# Patient Record
Sex: Female | Born: 1935 | Race: White | Hispanic: No | Marital: Married | Smoking: Never smoker
Health system: Southern US, Community
[De-identification: ages and names within clinical notes are randomized; demographics above are authoritative.]

## PROBLEM LIST (undated history)

## (undated) DIAGNOSIS — Z789 Other specified health status: Secondary | ICD-10-CM

## (undated) DIAGNOSIS — L299 Pruritus, unspecified: Secondary | ICD-10-CM

## (undated) DIAGNOSIS — M1990 Unspecified osteoarthritis, unspecified site: Secondary | ICD-10-CM

## (undated) DIAGNOSIS — E039 Hypothyroidism, unspecified: Secondary | ICD-10-CM

## (undated) DIAGNOSIS — F419 Anxiety disorder, unspecified: Secondary | ICD-10-CM

## (undated) DIAGNOSIS — F32A Depression, unspecified: Secondary | ICD-10-CM

## (undated) DIAGNOSIS — N189 Chronic kidney disease, unspecified: Secondary | ICD-10-CM

## (undated) DIAGNOSIS — F329 Major depressive disorder, single episode, unspecified: Secondary | ICD-10-CM

## (undated) DIAGNOSIS — K219 Gastro-esophageal reflux disease without esophagitis: Secondary | ICD-10-CM

## (undated) DIAGNOSIS — E079 Disorder of thyroid, unspecified: Secondary | ICD-10-CM

## (undated) DIAGNOSIS — R140 Abdominal distension (gaseous): Secondary | ICD-10-CM

## (undated) HISTORY — PX: ABDOMINAL HYSTERECTOMY: SHX81

## (undated) HISTORY — DX: Abdominal distension (gaseous): R14.0

## (undated) HISTORY — DX: Disorder of thyroid, unspecified: E07.9

## (undated) HISTORY — PX: NOSE SURGERY: SHX723

## (undated) HISTORY — PX: OTHER SURGICAL HISTORY: SHX169

---
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
## 1998-10-31 ENCOUNTER — Other Ambulatory Visit: Admission: RE | Admit: 1998-10-31 | Discharge: 1998-10-31 | Payer: Self-pay | Admitting: Obstetrics & Gynecology

## 1999-11-17 ENCOUNTER — Other Ambulatory Visit: Admission: RE | Admit: 1999-11-17 | Discharge: 1999-11-17 | Payer: Self-pay | Admitting: Obstetrics & Gynecology

## 2001-09-20 ENCOUNTER — Other Ambulatory Visit: Admission: RE | Admit: 2001-09-20 | Discharge: 2001-09-20 | Payer: Self-pay | Admitting: Obstetrics & Gynecology

## 2002-10-31 ENCOUNTER — Other Ambulatory Visit: Admission: RE | Admit: 2002-10-31 | Discharge: 2002-10-31 | Payer: Self-pay | Admitting: Obstetrics & Gynecology

## 2004-07-07 ENCOUNTER — Other Ambulatory Visit: Admission: RE | Admit: 2004-07-07 | Discharge: 2004-07-07 | Payer: Self-pay | Admitting: Obstetrics & Gynecology

## 2010-07-01 ENCOUNTER — Encounter: Admission: RE | Admit: 2010-07-01 | Discharge: 2010-07-01 | Payer: Self-pay | Admitting: Internal Medicine

## 2011-09-18 ENCOUNTER — Ambulatory Visit (INDEPENDENT_AMBULATORY_CARE_PROVIDER_SITE_OTHER): Payer: Medicare Other | Admitting: General Surgery

## 2011-09-18 ENCOUNTER — Encounter (INDEPENDENT_AMBULATORY_CARE_PROVIDER_SITE_OTHER): Payer: Self-pay | Admitting: General Surgery

## 2011-09-18 VITALS — BP 140/62 | HR 71 | Temp 97.1°F | Resp 16 | Ht 64.0 in | Wt 144.0 lb

## 2011-09-18 DIAGNOSIS — R1011 Right upper quadrant pain: Secondary | ICD-10-CM

## 2011-09-18 DIAGNOSIS — G8929 Other chronic pain: Secondary | ICD-10-CM | POA: Insufficient documentation

## 2011-09-18 DIAGNOSIS — R300 Dysuria: Secondary | ICD-10-CM | POA: Insufficient documentation

## 2011-09-18 LAB — URINALYSIS, ROUTINE W REFLEX MICROSCOPIC
Bilirubin Urine: NEGATIVE
Ketones, ur: NEGATIVE mg/dL
Nitrite: NEGATIVE
Protein, ur: NEGATIVE mg/dL
Urobilinogen, UA: 0.2 mg/dL (ref 0.0–1.0)

## 2011-09-18 NOTE — Patient Instructions (Signed)
Follow up with me after CT scan. We need labs and notes from primary physician, Dr. Thea Silversmith.  If CT is negative, we will order RUQ ultrasound.

## 2011-09-18 NOTE — Progress Notes (Signed)
Chief Complaint  Patient presents with  . Pain    Right side. Seen by Dr. Ronne Binning    HISTORY: Patient is an 77 year old female with pain on and off in the right side of her abdomen for several months. This has been getting worse and yesterday was quite severe. She describes as a dull ache in the area of her appendectomy scar. She describes localized swelling that occurs after she eats. The pain is worse after she eats. She is feeling constipated but has been working on this with laxatives. She has also recently had symptoms of a urinary tract infection such as dysuria. She was empirically placed on Bactrim from her primary care physician Dr. Redgie Grayer. She denies fevers and chills. She has not had any scan imaging to evaluate this pain. She states that she had labs drawn her primary care physician's office a month ago.  Past Medical History  Diagnosis Date  . Thyroid disease   . Constipation   . Abdominal pain   . Abdominal distension     Past Surgical History  Procedure Date  . Bladder tack     Patient does not remember date  . Vericose vein removal     legs. does not remember date of procedure  . Abdominal hysterectomy     partial - patient does not remember date  . Appendectomy 1948    Current Outpatient Prescriptions  Medication Sig Dispense Refill  . ALPRAZolam (XANAX) 0.25 MG tablet Take 0.25 mg by mouth 2 (two) times daily.        . Cholecalciferol (VITAMIN D3) 1000 UNITS CAPS Take by mouth daily.        . Multiple Vitamins-Minerals (CENTRUM PO) Take by mouth as needed.        . simvastatin (ZOCOR) 20 MG tablet Take 20 mg by mouth at bedtime.        . sulfamethoxazole-trimethoprim (BACTRIM DS) 800-160 MG per tablet       . SYNTHROID 88 MCG tablet          No Known Allergies   Family History  Problem Relation Age of Onset  . Stroke Mother      History   Social History  . Marital Status: Married    Spouse Name: N/A    Number of Children: N/A  . Years  of Education: N/A   Social History Main Topics  . Smoking status: Never Smoker   . Smokeless tobacco: Never Used  . Alcohol Use: No  . Drug Use: No  . Sexually Active: None   Other Topics Concern  . None   Social History Narrative  . None     REVIEW OF SYSTEMS - PERTINENT POSITIVES ONLY: 12 point review of systems negative other than HPI and PMH. EXAM: Filed Vitals:   09/18/11 1109  BP: 140/62  Pulse: 71  Temp: 97.1 F (36.2 C)  Resp: 16    Gen:  No acute distress.  Well nourished and well groomed.   Neurological: Alert and oriented to person, place, and time. Coordination normal.  Head: Normocephalic and atraumatic.  Eyes: Conjunctivae are normal. Pupils are equal, round, and reactive to light. No scleral icterus.  Neck: Normal range of motion. Neck supple. No tracheal deviation or thyromegaly present.  Cardiovascular: Normal rate, regular rhythm, normal heart sounds and intact distal pulses.  Exam reveals no gallop and no friction rub.  No murmur heard. Respiratory: Effort normal.  No respiratory distress. No chest wall tenderness. Breath sounds normal.  No wheezes, rales or rhonchi.  GI: Soft. Bowel sounds are normal. The abdomen is soft.  There is no rebound and no guarding. There is a midline incision below the umbilicus.  There is also an oblique incision in the RLQ.  No hernia is palpable.  The mid R abdomen and RLQ incision is tender.   Musculoskeletal: Normal range of motion. Extremities are nontender.  Lymphadenopathy: No cervical, preauricular, postauricular or axillary adenopathy is present Skin: Skin is warm and dry. No rash noted. No diaphoresis. No erythema. No pallor. No clubbing, cyanosis, or edema.   Psychiatric: Normal mood and affect. Behavior is normal. Judgment and thought content normal.    LABORATORY RESULTS: No labs are available.     RADIOLOGY RESULTS: No reports are available.  ASSESSMENT AND PLAN:   Chronic RUQ pain Cannot palpate right  sided hernia, but highly suspicious given history of localized swelling at prior appendectomy scar.   Will get CT abd/pelvis to evaluate. Pt with history of urinary tract infections as well.   Will order UA. If CT negative for hernia, may need RUQ ultrasound to evaluate gallbladder.  She has chronic nausea as well.   Will follow up after study. Will need labs from primary care physician.        Maudry Diego MD Surgical Oncology, General and Endocrine Surgery [facility]      Visit Diagnoses: 1. Chronic RUQ pain   2. Dysuria     Primary Care Physician: Thayer Headings, MD, MD

## 2011-09-18 NOTE — Assessment & Plan Note (Signed)
Cannot palpate right sided hernia, but highly suspicious given history of localized swelling at prior appendectomy scar.   Will get CT abd/pelvis to evaluate. Pt with history of urinary tract infections as well.   Will order UA. If CT negative for hernia, may need RUQ ultrasound to evaluate gallbladder.  She has chronic nausea as well.   Will follow up after study. Will need labs from primary care physician.

## 2011-09-30 ENCOUNTER — Ambulatory Visit
Admission: RE | Admit: 2011-09-30 | Discharge: 2011-09-30 | Disposition: A | Discharge status: Home / Self Care | Payer: Medicare Other | Source: Ambulatory Visit | Attending: General Surgery | Admitting: General Surgery

## 2011-09-30 DIAGNOSIS — G8929 Other chronic pain: Secondary | ICD-10-CM

## 2011-09-30 MED ORDER — IOHEXOL 300 MG/ML  SOLN
100.0000 mL | Freq: Once | INTRAMUSCULAR | Status: AC | PRN
  Administered 2011-09-30: 100 mL via INTRAVENOUS

## 2011-10-05 ENCOUNTER — Encounter (INDEPENDENT_AMBULATORY_CARE_PROVIDER_SITE_OTHER): Payer: Medicare Other | Admitting: General Surgery

## 2011-10-05 ENCOUNTER — Encounter (INDEPENDENT_AMBULATORY_CARE_PROVIDER_SITE_OTHER): Payer: Self-pay | Admitting: General Surgery

## 2011-10-05 ENCOUNTER — Ambulatory Visit (INDEPENDENT_AMBULATORY_CARE_PROVIDER_SITE_OTHER): Payer: Medicare Other | Admitting: General Surgery

## 2011-10-05 DIAGNOSIS — K8020 Calculus of gallbladder without cholecystitis without obstruction: Secondary | ICD-10-CM | POA: Insufficient documentation

## 2011-10-05 NOTE — Assessment & Plan Note (Signed)
Chronic RUQ discomfort, bloating, and nausea likely from cholecystitis. However, pt give a history of bulge over vertical paramedian appendectomy scar which could represent a hernia.   CT is negative for hernia.  Advised pt to avoid fatty/greasy foods.  At this point, her symptoms are not severe, and she is loathe to undergo surgery. I will see her in 2 months and we will decide at that point whether or not to proceed with cholecystectomy.  We could evaluate her for hernia at that time.

## 2011-10-05 NOTE — Progress Notes (Signed)
HISTORY: Pt feeling much better than when I last saw her.  She had a UTI at that point, and was in tears.  At this point, she states that her pain is minimal.  She continues to have bloating especially on the right.  She has some dyspepsia/nausea after she eats, especially in the morning.  She denies fevers/ chills.  She does not experience vomiting.  She has undergone CT scan which was negative for hernia, but positive for cholelithiasis.     EXAM: Head: Normocephalic and atraumatic.  Eyes:  Conjunctivae are normal. Pupils are equal, round, and reactive to light. No scleral icterus.  Resp: No respiratory distress, normal effort. Abd:  Abdomen is soft, non distended and non tender. No masses are palpable.  There is no rebound and no guarding. No hernia is palpable.   Neurological: Alert and oriented to person, place, and time. Coordination normal.  Skin: Skin is warm and dry. No rash noted. No diaphoretic. No erythema. No pallor.  Psychiatric: Normal mood and affect. Normal behavior. Judgment and thought content normal.   IMAGING: CT scan abd/pelvis IMPRESSION:  1. No acute abdominal or pelvic process.  2. Cholelithiasis without evidence of cholecystitis.   ASSESSMENT AND PLAN:   Cholelithiasis Chronic RUQ discomfort, bloating, and nausea likely from cholecystitis. However, pt give a history of bulge over vertical paramedian appendectomy scar which could represent a hernia.   CT is negative for hernia.  Advised pt to avoid fatty/greasy foods.  At this point, her symptoms are not severe, and she is loathe to undergo surgery. I will see her in 2 months and we will decide at that point whether or not to proceed with cholecystectomy.  We could evaluate her for hernia at that time.       Maudry Diego, MD Surgical Oncology, General & Endocrine Surgery [facility]  Thayer Headings, MD, MD Thayer Headings, MD

## 2011-10-05 NOTE — Patient Instructions (Signed)
Avoid fatty or greasy foods. 

## 2011-10-09 ENCOUNTER — Telehealth (INDEPENDENT_AMBULATORY_CARE_PROVIDER_SITE_OTHER): Payer: Self-pay | Admitting: General Surgery

## 2011-10-09 NOTE — Telephone Encounter (Addendum)
Message copied by Latricia Heft on Fri Oct 09, 2011  1:11 PM ------      Message from: Almond Lint      Created: Thu Oct 01, 2011  8:01 AM  Patient states she will call and schedule her appt after the holidays. She was reminded if she becomes symptomatic to contact our office.      Pt does have gallstones.  Please check to make sure she has follow up appointment to see me to discuss.      Tx      FB

## 2011-10-19 ENCOUNTER — Encounter (INDEPENDENT_AMBULATORY_CARE_PROVIDER_SITE_OTHER): Payer: Medicare Other | Admitting: General Surgery

## 2011-10-26 ENCOUNTER — Other Ambulatory Visit: Payer: Self-pay

## 2011-11-17 ENCOUNTER — Encounter (INDEPENDENT_AMBULATORY_CARE_PROVIDER_SITE_OTHER): Payer: Medicare Other | Admitting: General Surgery

## 2011-12-11 ENCOUNTER — Encounter (INDEPENDENT_AMBULATORY_CARE_PROVIDER_SITE_OTHER): Payer: Self-pay | Admitting: General Surgery

## 2011-12-11 ENCOUNTER — Ambulatory Visit (INDEPENDENT_AMBULATORY_CARE_PROVIDER_SITE_OTHER): Payer: Medicare Other | Admitting: General Surgery

## 2011-12-11 DIAGNOSIS — K8020 Calculus of gallbladder without cholecystitis without obstruction: Secondary | ICD-10-CM

## 2011-12-11 NOTE — Assessment & Plan Note (Signed)
Pt still does not have typical gallbladder pain, but now is tender in RUQ and never has been before.   She is waking up with gnawing hungry feeling in AM and feels better after taking a half of a xanax.   This is the best she has looked since I met her.    She was offered lap chole.  She is going to try two weeks of daily prilosec.  If no change, we will schedule lap chole.

## 2011-12-11 NOTE — Patient Instructions (Signed)
Take prilosec daily for 2-3 weeks.   Call if no improvement.

## 2011-12-11 NOTE — Progress Notes (Signed)
HISTORY: Pt overall doing better, but still has gnawing epigastric pain in the AM.  This feels better with xanax.  She takes prn omeprazole as well that "sometimes helps," but the most she has taken it in a row is 3 days.  Her symptoms are very vague, and I have difficulty telling exactly what her main complaints are.  She also complains of bilateral lower quadrant pain and bloating.     PERTINENT REVIEW OF SYSTEMS: Otherwise negative.    EXAM: Head: Normocephalic and atraumatic.  Eyes:  Conjunctivae are normal. Pupils are equal, round, and reactive to light. No scleral icterus.  Neck:  Normal range of motion. Neck supple. No tracheal deviation present. No thyromegaly present.  Resp: No respiratory distress, normal effort. Abd:  Abdomen is soft, non distended and non tender. No masses are palpable.  There is no rebound and no guarding.  Neurological: Alert and oriented to person, place, and time. Coordination normal.  Skin: Skin is warm and dry. No rash noted. No diaphoretic. No erythema. No pallor.  Psychiatric: Normal mood and affect. Normal behavior. Judgment and thought content normal.    ASSESSMENT AND PLAN:   Cholelithiasis Pt still does not have typical gallbladder pain, but now is tender in RUQ and never has been before.   She is waking up with gnawing hungry feeling in AM and feels better after taking a half of a xanax.   This is the best she has looked since I met her.    She was offered lap chole.  She is going to try two weeks of daily prilosec.  If no change, we will schedule lap chole.     Minoru Chap L Arlanda Shiplett, MD Surgical Oncology, General & Endocrine Surgery [facility]  MACKENZIE,BRIAN, MD, MD Mackenzie, Brian, MD   

## 2011-12-15 ENCOUNTER — Other Ambulatory Visit (INDEPENDENT_AMBULATORY_CARE_PROVIDER_SITE_OTHER): Payer: Self-pay | Admitting: General Surgery

## 2011-12-15 ENCOUNTER — Telehealth (INDEPENDENT_AMBULATORY_CARE_PROVIDER_SITE_OTHER): Payer: Self-pay

## 2011-12-15 NOTE — Telephone Encounter (Signed)
Pt's husband walked in to the office today.  The patient is ready to schedule surgery.

## 2011-12-16 ENCOUNTER — Encounter (HOSPITAL_COMMUNITY): Payer: Self-pay

## 2011-12-25 ENCOUNTER — Encounter (HOSPITAL_COMMUNITY)
Admission: RE | Admit: 2011-12-25 | Discharge: 2011-12-25 | Disposition: A | Discharge status: Home / Self Care | Payer: Medicare Other | Source: Ambulatory Visit | Attending: General Surgery | Admitting: General Surgery

## 2011-12-25 ENCOUNTER — Encounter (HOSPITAL_COMMUNITY): Payer: Self-pay

## 2011-12-25 ENCOUNTER — Other Ambulatory Visit: Payer: Self-pay

## 2011-12-25 HISTORY — DX: Unspecified osteoarthritis, unspecified site: M19.90

## 2011-12-25 HISTORY — DX: Chronic kidney disease, unspecified: N18.9

## 2011-12-25 HISTORY — DX: Other specified health status: Z78.9

## 2011-12-25 HISTORY — DX: Depression, unspecified: F32.A

## 2011-12-25 HISTORY — DX: Gastro-esophageal reflux disease without esophagitis: K21.9

## 2011-12-25 HISTORY — DX: Anxiety disorder, unspecified: F41.9

## 2011-12-25 HISTORY — DX: Hypothyroidism, unspecified: E03.9

## 2011-12-25 HISTORY — DX: Major depressive disorder, single episode, unspecified: F32.9

## 2011-12-25 LAB — CBC
HCT: 43.6 % (ref 36.0–46.0)
MCH: 31.3 pg (ref 26.0–34.0)
MCHC: 34.2 g/dL (ref 30.0–36.0)
MCV: 91.6 fL (ref 78.0–100.0)
RDW: 12.1 % (ref 11.5–15.5)

## 2011-12-25 LAB — COMPREHENSIVE METABOLIC PANEL
Albumin: 3.2 g/dL — ABNORMAL LOW (ref 3.5–5.2)
BUN: 22 mg/dL (ref 6–23)
Creatinine, Ser: 0.86 mg/dL (ref 0.50–1.10)
GFR calc Af Amer: 72 mL/min — ABNORMAL LOW (ref >90)
Total Protein: 7.5 g/dL (ref 6.0–8.3)

## 2011-12-25 LAB — DIFFERENTIAL
Basophils Relative: 0 % (ref 0–1)
Lymphs Abs: 2 10*3/uL (ref 0.7–4.0)
Monocytes Relative: 9 % (ref 3–12)
Neutro Abs: 4.2 10*3/uL (ref 1.7–7.7)
Neutrophils Relative %: 61 % (ref 43–77)

## 2011-12-25 NOTE — Pre-Procedure Instructions (Signed)
20 MAYMUNA DETZEL  12/25/2011   Your procedure is scheduled on:    12/30/11 [facility]  Report to [facility] at 630 AM.  Call this number if you have problems the morning of surgery: [phone]   Remember:   Do not eat food:After Midnight.  May have clear liquids: up to 4 Hours before arrival.  Clear liquids include soda, tea, black coffee, apple or grape juice, broth.  Take these medicines the morning of surgery with A SIP OF WATER:    XANAX, SYNTHROID   STOP ASPIRIN, COUMADIN, PLAVIX, EFFIENT, HERBAL MEDS    Do not wear jewelry, make-up or nail polish.  Do not wear lotions, powders, or perfumes. You may wear deodorant.  Do not shave 48 hours prior to surgery.  Do not bring valuables to the hospital.  Contacts, dentures or bridgework may not be worn into surgery.  Leave suitcase in the car. After surgery it may be brought to your room.  For patients admitted to the hospital, checkout time is 11:00 AM the day of discharge.   Patients discharged the day of surgery will not be allowed to drive home.  Name and phone number of your driver: SPOUSE  Special Instructions: CHG Shower Use Special Wash: 1/2 bottle night before surgery and 1/2 bottle morning of surgery.   Please read over the following fact sheets that you were given: Pain Booklet, MRSA Information and Surgical Site Infection Prevention

## 2011-12-28 NOTE — Consult Note (Signed)
Anesthesia:  Patient is a 79 year old female scheduled for a laparoscopic cholecystectomy on 12/30/11.  Her history includes hypothyroidism, GERD, depression, anxiety, UTI last month.  PCP is Dr. Thayer Headings.  Labs noted.  EKG from 12/25/11 showed SB with a rate of 58 bpm, minimal voltage criteria for LVH, cannot rule out anterior infarct (age undetermined).  There is some motion, particularly in V3.  There are no comparison EKGs at [facility].  Her PCP office faxed her last two EKGs from 2011 and 2012.  Overall, I think her EKGs are stable.  She was actually evaluated for CP by Dr. Thea Silversmith in March of this year and subsequently had a exercise treadmill stress test at [facility] that was normal.    Plan to proceed.

## 2011-12-28 NOTE — Progress Notes (Signed)
Quick Note:  Labs ok for surgery ______ 

## 2011-12-29 MED ORDER — DEXTROSE 5 % IV SOLN
1.0000 g | INTRAVENOUS | Status: AC
  Administered 2011-12-30: 1 g via INTRAVENOUS
  Filled 2011-12-29: qty 1

## 2011-12-30 ENCOUNTER — Encounter (HOSPITAL_COMMUNITY): Payer: Self-pay | Admitting: *Deleted

## 2011-12-30 ENCOUNTER — Encounter (HOSPITAL_COMMUNITY)
Admission: RE | Disposition: A | Discharge status: Home / Self Care | Payer: Self-pay | Source: Ambulatory Visit | Attending: General Surgery

## 2011-12-30 ENCOUNTER — Ambulatory Visit (HOSPITAL_COMMUNITY): Payer: Medicare Other

## 2011-12-30 ENCOUNTER — Other Ambulatory Visit (INDEPENDENT_AMBULATORY_CARE_PROVIDER_SITE_OTHER): Payer: Self-pay | Admitting: General Surgery

## 2011-12-30 ENCOUNTER — Ambulatory Visit (HOSPITAL_COMMUNITY)
Admission: RE | Admit: 2011-12-30 | Discharge: 2011-12-31 | Disposition: A | Discharge status: Home / Self Care | Payer: Medicare Other | Source: Ambulatory Visit | Attending: General Surgery | Admitting: General Surgery

## 2011-12-30 ENCOUNTER — Encounter (HOSPITAL_COMMUNITY): Payer: Self-pay | Admitting: Vascular Surgery

## 2011-12-30 ENCOUNTER — Ambulatory Visit (HOSPITAL_COMMUNITY): Payer: Medicare Other | Admitting: Vascular Surgery

## 2011-12-30 DIAGNOSIS — E039 Hypothyroidism, unspecified: Secondary | ICD-10-CM | POA: Insufficient documentation

## 2011-12-30 DIAGNOSIS — R300 Dysuria: Secondary | ICD-10-CM

## 2011-12-30 DIAGNOSIS — G8929 Other chronic pain: Secondary | ICD-10-CM

## 2011-12-30 DIAGNOSIS — K8001 Calculus of gallbladder with acute cholecystitis with obstruction: Secondary | ICD-10-CM | POA: Insufficient documentation

## 2011-12-30 DIAGNOSIS — F329 Major depressive disorder, single episode, unspecified: Secondary | ICD-10-CM | POA: Insufficient documentation

## 2011-12-30 DIAGNOSIS — R188 Other ascites: Secondary | ICD-10-CM | POA: Insufficient documentation

## 2011-12-30 DIAGNOSIS — F3289 Other specified depressive episodes: Secondary | ICD-10-CM | POA: Insufficient documentation

## 2011-12-30 DIAGNOSIS — K7689 Other specified diseases of liver: Secondary | ICD-10-CM | POA: Insufficient documentation

## 2011-12-30 DIAGNOSIS — F411 Generalized anxiety disorder: Secondary | ICD-10-CM | POA: Insufficient documentation

## 2011-12-30 DIAGNOSIS — Z79899 Other long term (current) drug therapy: Secondary | ICD-10-CM | POA: Insufficient documentation

## 2011-12-30 DIAGNOSIS — K8010 Calculus of gallbladder with chronic cholecystitis without obstruction: Secondary | ICD-10-CM

## 2011-12-30 HISTORY — PX: CHOLECYSTECTOMY: SHX55

## 2011-12-30 SURGERY — LAPAROSCOPIC CHOLECYSTECTOMY WITH INTRAOPERATIVE CHOLANGIOGRAM
Anesthesia: General | Site: Abdomen | Wound class: Contaminated

## 2011-12-30 MED ORDER — PROPOFOL 10 MG/ML IV EMUL
INTRAVENOUS | Status: DC | PRN
  Administered 2011-12-30: 150 mg via INTRAVENOUS

## 2011-12-30 MED ORDER — HYDROCODONE-ACETAMINOPHEN 5-325 MG PO TABS
1.0000 | ORAL_TABLET | ORAL | Status: DC | PRN
  Administered 2011-12-31: 0.5 via ORAL
  Filled 2011-12-30: qty 2

## 2011-12-30 MED ORDER — PROMETHAZINE HCL 25 MG/ML IJ SOLN
INTRAMUSCULAR | Status: AC
  Administered 2011-12-30: 12.5 mg via INTRAVENOUS
  Filled 2011-12-30: qty 1

## 2011-12-30 MED ORDER — LIDOCAINE HCL 4 % MT SOLN
OROMUCOSAL | Status: DC | PRN
  Administered 2011-12-30: 4 mL via TOPICAL

## 2011-12-30 MED ORDER — IOHEXOL 300 MG/ML  SOLN
INTRAMUSCULAR | Status: DC | PRN
  Administered 2011-12-30: 20 mL

## 2011-12-30 MED ORDER — PANTOPRAZOLE SODIUM 40 MG IV SOLR: 40.0000 mg | Freq: Every day | INTRAVENOUS | Status: DC

## 2011-12-30 MED ORDER — ROCURONIUM BROMIDE 100 MG/10ML IV SOLN
INTRAVENOUS | Status: DC | PRN
  Administered 2011-12-30: 20 mg via INTRAVENOUS
  Administered 2011-12-30: 30 mg via INTRAVENOUS

## 2011-12-30 MED ORDER — LACTATED RINGERS IV SOLN
INTRAVENOUS | Status: DC | PRN
  Administered 2011-12-30 (×2): via INTRAVENOUS

## 2011-12-30 MED ORDER — SODIUM CHLORIDE 0.9 % IJ SOLN: 3.0000 mL | Freq: Two times a day (BID) | INTRAMUSCULAR | Status: DC

## 2011-12-30 MED ORDER — VITAMIN D3 25 MCG (1000 UT) PO CAPS: 1000.0000 [IU] | ORAL_CAPSULE | Freq: Every day | ORAL | Status: DC

## 2011-12-30 MED ORDER — LEVOTHYROXINE SODIUM 88 MCG PO TABS
88.0000 ug | ORAL_TABLET | Freq: Every day | ORAL | Status: DC
  Administered 2011-12-31: 88 ug via ORAL
  Filled 2011-12-30 (×2): qty 1

## 2011-12-30 MED ORDER — ONDANSETRON HCL 4 MG/2ML IJ SOLN
INTRAMUSCULAR | Status: AC
  Filled 2011-12-30: qty 2

## 2011-12-30 MED ORDER — SODIUM CHLORIDE 0.9 % IV SOLN: 250.0000 mL | INTRAVENOUS | Status: DC | PRN

## 2011-12-30 MED ORDER — ONDANSETRON HCL 4 MG/2ML IJ SOLN: 4.0000 mg | Freq: Four times a day (QID) | INTRAMUSCULAR | Status: DC | PRN

## 2011-12-30 MED ORDER — BUPIVACAINE-EPINEPHRINE 0.25% -1:200000 IJ SOLN
INTRAMUSCULAR | Status: DC | PRN
  Administered 2011-12-30: 4.5 mL

## 2011-12-30 MED ORDER — GLYCOPYRROLATE 0.2 MG/ML IJ SOLN
INTRAMUSCULAR | Status: DC | PRN
  Administered 2011-12-30 (×2): .4 mg via INTRAVENOUS

## 2011-12-30 MED ORDER — SIMVASTATIN 20 MG PO TABS
20.0000 mg | ORAL_TABLET | Freq: Every evening | ORAL | Status: DC
  Filled 2011-12-30 (×2): qty 1

## 2011-12-30 MED ORDER — VITAMIN D3 25 MCG (1000 UNIT) PO TABS
1000.0000 [IU] | ORAL_TABLET | Freq: Every day | ORAL | Status: DC
  Administered 2011-12-31: 1000 [IU] via ORAL
  Filled 2011-12-30 (×2): qty 1

## 2011-12-30 MED ORDER — SODIUM CHLORIDE 0.9 % IR SOLN
Status: DC | PRN
  Administered 2011-12-30: 1

## 2011-12-30 MED ORDER — ACETAMINOPHEN 650 MG RE SUPP: 650.0000 mg | RECTAL | Status: DC | PRN

## 2011-12-30 MED ORDER — OXYCODONE-ACETAMINOPHEN 5-325 MG PO TABS: 1.0000 | ORAL_TABLET | ORAL | Status: AC | PRN

## 2011-12-30 MED ORDER — DIPHENHYDRAMINE HCL 50 MG/ML IJ SOLN: 12.5000 mg | Freq: Four times a day (QID) | INTRAMUSCULAR | Status: DC | PRN

## 2011-12-30 MED ORDER — ONDANSETRON HCL 4 MG/2ML IJ SOLN
INTRAMUSCULAR | Status: DC | PRN
  Administered 2011-12-30: 4 mg via INTRAVENOUS

## 2011-12-30 MED ORDER — LIDOCAINE HCL (CARDIAC) 20 MG/ML IV SOLN
INTRAVENOUS | Status: DC | PRN
  Administered 2011-12-30: 50 mg via INTRAVENOUS

## 2011-12-30 MED ORDER — 0.9 % SODIUM CHLORIDE (POUR BTL) OPTIME
TOPICAL | Status: DC | PRN
  Administered 2011-12-30: 1000 mL

## 2011-12-30 MED ORDER — ACETAMINOPHEN 325 MG PO TABS: 650.0000 mg | ORAL_TABLET | ORAL | Status: DC | PRN

## 2011-12-30 MED ORDER — PROMETHAZINE HCL 25 MG/ML IJ SOLN
12.5000 mg | Freq: Four times a day (QID) | INTRAMUSCULAR | Status: DC | PRN
  Administered 2011-12-30: 12.5 mg via INTRAVENOUS
  Filled 2011-12-30: qty 1

## 2011-12-30 MED ORDER — SODIUM CHLORIDE 0.9 % IJ SOLN: 3.0000 mL | INTRAMUSCULAR | Status: DC | PRN

## 2011-12-30 MED ORDER — LIDOCAINE HCL (PF) 1 % IJ SOLN
INTRAMUSCULAR | Status: DC | PRN
  Administered 2011-12-30: 4.5 mL

## 2011-12-30 MED ORDER — KCL IN DEXTROSE-NACL 20-5-0.45 MEQ/L-%-% IV SOLN
INTRAVENOUS | Status: DC
  Administered 2011-12-30 – 2011-12-31 (×2): via INTRAVENOUS
  Filled 2011-12-30 (×4): qty 1000

## 2011-12-30 MED ORDER — NEOSTIGMINE METHYLSULFATE 1 MG/ML IJ SOLN
INTRAMUSCULAR | Status: DC | PRN
  Administered 2011-12-30: 3 mg via INTRAVENOUS

## 2011-12-30 MED ORDER — MORPHINE SULFATE 2 MG/ML IJ SOLN: 1.0000 mg | INTRAMUSCULAR | Status: DC | PRN

## 2011-12-30 MED ORDER — PANTOPRAZOLE SODIUM 40 MG PO TBEC: 40.0000 mg | DELAYED_RELEASE_TABLET | Freq: Every day | ORAL | Status: DC

## 2011-12-30 MED ORDER — ALPRAZOLAM 0.25 MG PO TABS
0.2500 mg | ORAL_TABLET | Freq: Two times a day (BID) | ORAL | Status: DC
  Administered 2011-12-31: 0.125 mg via ORAL
  Filled 2011-12-30 (×2): qty 1

## 2011-12-30 MED ORDER — DIPHENHYDRAMINE HCL 12.5 MG/5ML PO ELIX
12.5000 mg | ORAL_SOLUTION | Freq: Four times a day (QID) | ORAL | Status: DC | PRN
  Filled 2011-12-30: qty 5

## 2011-12-30 MED ORDER — HYDROMORPHONE HCL PF 1 MG/ML IJ SOLN
0.2500 mg | INTRAMUSCULAR | Status: DC | PRN
  Administered 2011-12-30 (×2): 0.5 mg via INTRAVENOUS

## 2011-12-30 MED ORDER — OXYCODONE HCL 5 MG PO TABS
5.0000 mg | ORAL_TABLET | ORAL | Status: DC | PRN
  Administered 2011-12-30: 10 mg via ORAL
  Filled 2011-12-30: qty 2

## 2011-12-30 MED ORDER — ONDANSETRON HCL 4 MG/2ML IJ SOLN
4.0000 mg | Freq: Once | INTRAMUSCULAR | Status: AC | PRN
  Administered 2011-12-30: 4 mg via INTRAVENOUS

## 2011-12-30 MED ORDER — FENTANYL CITRATE 0.05 MG/ML IJ SOLN
INTRAMUSCULAR | Status: DC | PRN
  Administered 2011-12-30 (×2): 100 ug via INTRAVENOUS
  Administered 2011-12-30: 50 ug via INTRAVENOUS

## 2011-12-30 SURGICAL SUPPLY — 53 items
APPLIER CLIP ROT 10 11.4 M/L (STAPLE) ×2
BLADE SURG ROTATE 9660 (MISCELLANEOUS) IMPLANT
CANISTER SUCTION 2500CC (MISCELLANEOUS) ×2 IMPLANT
CATH EMB 5FR 80CM (CATHETERS) ×2 IMPLANT
CHLORAPREP W/TINT 26ML (MISCELLANEOUS) ×2 IMPLANT
CLIP APPLIE ROT 10 11.4 M/L (STAPLE) ×1 IMPLANT
CLOTH BEACON ORANGE TIMEOUT ST (SAFETY) ×2 IMPLANT
COVER MAYO STAND STRL (DRAPES) ×2 IMPLANT
COVER SURGICAL LIGHT HANDLE (MISCELLANEOUS) ×2 IMPLANT
DECANTER SPIKE VIAL GLASS SM (MISCELLANEOUS) IMPLANT
DERMABOND ADVANCED (GAUZE/BANDAGES/DRESSINGS)
DERMABOND ADVANCED .7 DNX12 (GAUZE/BANDAGES/DRESSINGS) IMPLANT
DRAPE C-ARM 42X72 X-RAY (DRAPES) ×2 IMPLANT
DRAPE UTILITY 15X26 W/TAPE STR (DRAPE) ×4 IMPLANT
DRAPE WARM FLUID 44X44 (DRAPE) ×2 IMPLANT
ELECT REM PT RETURN 9FT ADLT (ELECTROSURGICAL) ×2
ELECTRODE REM PT RTRN 9FT ADLT (ELECTROSURGICAL) ×1 IMPLANT
FILTER SMOKE EVAC LAPAROSHD (FILTER) ×2 IMPLANT
GLOVE BIO SURGEON STRL SZ 6 (GLOVE) ×2 IMPLANT
GLOVE BIO SURGEON STRL SZ7.5 (GLOVE) ×2 IMPLANT
GLOVE BIOGEL PI IND STRL 6.5 (GLOVE) ×2 IMPLANT
GLOVE BIOGEL PI IND STRL 7.0 (GLOVE) ×1 IMPLANT
GLOVE BIOGEL PI IND STRL 7.5 (GLOVE) ×1 IMPLANT
GLOVE BIOGEL PI INDICATOR 6.5 (GLOVE) ×2
GLOVE BIOGEL PI INDICATOR 7.0 (GLOVE) ×1
GLOVE BIOGEL PI INDICATOR 7.5 (GLOVE) ×1
GLOVE ECLIPSE 7.5 STRL STRAW (GLOVE) ×2 IMPLANT
GLOVE SS BIOGEL STRL SZ 6.5 (GLOVE) ×1 IMPLANT
GLOVE SUPERSENSE BIOGEL SZ 6.5 (GLOVE) ×1
GLOVE SURG SS PI 6.5 STRL IVOR (GLOVE) ×2 IMPLANT
GOWN PREVENTION PLUS XXLARGE (GOWN DISPOSABLE) ×4 IMPLANT
GOWN STRL NON-REIN LRG LVL3 (GOWN DISPOSABLE) ×4 IMPLANT
IV CATH 14GX2 1/4 (CATHETERS) ×2 IMPLANT
KIT BASIN OR (CUSTOM PROCEDURE TRAY) ×2 IMPLANT
KIT ROOM TURNOVER OR (KITS) ×2 IMPLANT
NS IRRIG 1000ML POUR BTL (IV SOLUTION) ×2 IMPLANT
PAD ARMBOARD 7.5X6 YLW CONV (MISCELLANEOUS) ×4 IMPLANT
POUCH SPECIMEN RETRIEVAL 10MM (ENDOMECHANICALS) ×2 IMPLANT
SCISSORS LAP 5X35 DISP (ENDOMECHANICALS) IMPLANT
SET CHOLANGIOGRAPH 5 50 .035 (SET/KITS/TRAYS/PACK) ×2 IMPLANT
SET IRRIG TUBING LAPAROSCOPIC (IRRIGATION / IRRIGATOR) ×2 IMPLANT
SLEEVE ENDOPATH XCEL 5M (ENDOMECHANICALS) ×2 IMPLANT
SPECIMEN JAR SMALL (MISCELLANEOUS) ×2 IMPLANT
SUT MNCRL AB 4-0 PS2 18 (SUTURE) ×2 IMPLANT
SYR 3ML LL SCALE MARK (SYRINGE) ×2 IMPLANT
SYR TB 1ML LUER SLIP (SYRINGE) ×2 IMPLANT
TOWEL OR 17X24 6PK STRL BLUE (TOWEL DISPOSABLE) ×2 IMPLANT
TOWEL OR 17X26 10 PK STRL BLUE (TOWEL DISPOSABLE) ×2 IMPLANT
TRAY LAPAROSCOPIC (CUSTOM PROCEDURE TRAY) ×2 IMPLANT
TROCAR XCEL BLUNT TIP 100MML (ENDOMECHANICALS) ×2 IMPLANT
TROCAR XCEL NON-BLD 11X100MML (ENDOMECHANICALS) ×2 IMPLANT
TROCAR XCEL NON-BLD 5MMX100MML (ENDOMECHANICALS) ×2 IMPLANT
WATER STERILE IRR 1000ML POUR (IV SOLUTION) IMPLANT

## 2011-12-30 NOTE — Op Note (Signed)
Laparoscopic Cholecystectomy with IOC Procedure Note  Indications: This patient presents with cholelithiasis and will undergo laparoscopic cholecystectomy.  Pre-operative Diagnosis: Calculus of bile duct with acute cholecystitis and obstruction  Post-operative Diagnosis: Same  Surgeon: Almond Lint   Assistants: Magnus Ivan, RNFA  Anesthesia: General endotracheal anesthesia and Exparel  ASA Class: 3  Procedure Details  The patient was seen again in the Holding Room. The risks, benefits, complications, treatment options, and expected outcomes were discussed with the patient. The possibilities of  bleeding, recurrent infection, damage to nearby structures, the need for additional procedures, failure to diagnose a condition, the possible need to convert to an open procedure, and creating a complication requiring transfusion or operation were discussed with the patient. The likelihood of improving the patient's symptoms with return to their baseline status is good.    The patient and/or family concurred with the proposed plan, giving informed consent. The site of surgery properly noted. The patient was taken to Operating Room, and the procedure verified as Laparoscopic Cholecystectomy with Intraoperative Cholangiogram. A Time Out was held and the above information confirmed.  Prior to the induction of general anesthesia, antibiotic prophylaxis was administered. General endotracheal anesthesia was then administered and tolerated well. After the induction, the abdomen was prepped with Chloraprep and draped in the sterile fashion. The patient was positioned in the supine position.  Local anesthetic agent was injected into the skin near the umbilicus and an incision made. We dissected down to the abdominal fascia with blunt dissection.  The fascia was incised vertically and we entered the peritoneal cavity bluntly.  A pursestring suture of 0-Vicryl was placed around the fascial opening.  The Hasson  cannula was inserted and secured with the stay suture.  Pneumoperitoneum was then created with CO2 and tolerated well without any adverse changes in the patient's vital signs. An 11-mm port was placed in the subxiphoid position.  Two 5-mm ports were placed in the right upper quadrant. All skin incisions were infiltrated with a local anesthetic agent before making the incision and placing the trocars.   There were many adhesions of the omentum to the abdominal wall at the site of her hysterectomy incision, and these were taken down sharply with the laparoscopic scissors.  Care was taken to make sure no bowel was injured.    We positioned the patient in reverse Trendelenburg, tilted slightly to the patient's left.  The gallbladder was identified, the fundus grasped and retracted cephalad. Adhesions were lysed bluntly and with the electrocautery where indicated, taking care not to injure any adjacent organs or viscus. The infundibulum was grasped and retracted laterally, exposing the peritoneum overlying the triangle of Calot. This was then divided and exposed in a blunt fashion. A critical view of the cystic duct and cystic artery was obtained.  The cystic duct was clearly identified and bluntly dissected circumferentially. The cystic duct was ligated with a clip distally.   An incision was made in the cystic duct and the [facility] cholangiogram catheter introduced. The catheter was secured using a clip. A cholangiogram was then performed, but it appeared that there may be a stone in the common bile duct.  A Fogarty catheter was used to sweep the duct.  This was reinspected and the cholangiogram catheter was replaced.  The pt was placed in trendelenburg position, and a cholangiogram was then obtained which showed good visualization of the distal and proximal biliary tree.  At this point, there was no evidence of filling defect.  Contrast flowed easily into  the duodenum. The catheter was then removed.   The cystic  duct was then ligated with clips and divided. The cystic artery was identified, dissected free, ligated with clips and divided as well.   The gallbladder was dissected from the liver bed in retrograde fashion with the electrocautery. The gallbladder was removed and placed in an Endocatch bag.  The gallbladder and Endocatch bag were then removed through the umbilical port site.  The liver bed was irrigated and inspected. Hemostasis was achieved with the electrocautery. Copious irrigation was utilized and was repeatedly aspirated until clear.    We again inspected the right upper quadrant for hemostasis. A 4 quadrant inspection was performed and there was no evidence of bleeding in the remainder of the abdomen.   Pneumoperitoneum was released as we removed the trocars.   The pursestring suture was used to close the umbilical fascia.  4-0 Monocryl was used to close the skin.   The skin was cleaned and dry, and Dermabond was applied. The patient was then extubated and brought to the recovery room in stable condition. Instrument, sponge, and needle counts were correct at closure and at the conclusion of the case.   Findings: Cholecystitis with Cholelithiasis, bilious ascites upon immediate inspection of abdomen, fatty infiltration of the liver, cholangiogram with a filling defect distally, but contrast went into the duodenum.    Estimated Blood Loss: Minimal         Drains: 19 Fr Blake drain          Specimens: Gallbladder           Complications: None; patient tolerated the procedure well.         Disposition: PACU - hemodynamically stable.         Condition: stable

## 2011-12-30 NOTE — Transfer of Care (Signed)
Immediate Anesthesia Transfer of Care Note  Patient: Anne [name]  Procedure(s) Performed:  LAPAROSCOPIC CHOLECYSTECTOMY WITH INTRAOPERATIVE CHOLANGIOGRAM  Patient Location: PACU  Anesthesia Type: General  Level of Consciousness: awake, alert , oriented and patient cooperative  Airway & Oxygen Therapy: Patient Spontanous Breathing and Patient connected to nasal cannula oxygen  Post-op Assessment: Report given to PACU RN, Post -op Vital signs reviewed and stable and Patient moving all extremities  Post vital signs: Reviewed and stable  Complications: No apparent anesthesia complications

## 2011-12-30 NOTE — Anesthesia Postprocedure Evaluation (Signed)
  Anesthesia Post-op Note  Patient: Anne [name]  Procedure(s) Performed:  LAPAROSCOPIC CHOLECYSTECTOMY WITH INTRAOPERATIVE CHOLANGIOGRAM  Patient Location: PACU  Anesthesia Type: General  Level of Consciousness: awake, alert , oriented and patient cooperative  Airway and Oxygen Therapy: Patient Spontanous Breathing and Patient connected to nasal cannula oxygen  Post-op Pain: mild  Post-op Assessment: Post-op Vital signs reviewed, Patient's Cardiovascular Status Stable, Respiratory Function Stable, Patent Airway, No signs of Nausea or vomiting and Pain level controlled  Post-op Vital Signs: stable  Complications: No apparent anesthesia complications

## 2011-12-30 NOTE — Anesthesia Preprocedure Evaluation (Addendum)
Anesthesia Evaluation  Patient identified by MRN, date of birth, ID band Patient awake    Reviewed: Allergy & Precautions, H&P , NPO status , Patient's Chart, lab work & pertinent test results  History of Anesthesia Complications Negative for: history of anesthetic complications  Airway Mallampati: II TM Distance: >3 FB     Dental  (+) Teeth Intact and Dental Advisory Given   Pulmonary          Cardiovascular Regular Normal    Neuro/Psych PSYCHIATRIC DISORDERS Anxiety Depression    GI/Hepatic GERD-  Medicated and Controlled,  Endo/Other  Hypothyroidism   Renal/GU      Musculoskeletal   Abdominal   Peds  Hematology   Anesthesia Other Findings   Reproductive/Obstetrics                           Anesthesia Physical Anesthesia Plan  ASA: II  Anesthesia Plan: General   Post-op Pain Management:    Induction: Intravenous  Airway Management Planned: Oral ETT  Additional Equipment:   Intra-op Plan:   Post-operative Plan: Extubation in OR  Informed Consent: I have reviewed the patients History and Physical, chart, labs and discussed the procedure including the risks, benefits and alternatives for the proposed anesthesia with the patient or authorized representative who has indicated his/her understanding and acceptance.   Dental advisory given  Plan Discussed with: CRNA, Anesthesiologist and Surgeon  Anesthesia Plan Comments:         Anesthesia Quick Evaluation

## 2011-12-30 NOTE — Anesthesia Procedure Notes (Signed)
Procedure Name: Intubation Date/Time: 12/30/2011 8:43 AM Performed by: Fuller Canada Pre-anesthesia Checklist: Patient identified, Timeout performed, Emergency Drugs available, Suction available and Patient being monitored Patient Re-evaluated:Patient Re-evaluated prior to inductionOxygen Delivery Method: Circle System Utilized Preoxygenation: Pre-oxygenation with 100% oxygen Intubation Type: IV induction Ventilation: Mask ventilation without difficulty Laryngoscope Size: Mac and 3 Grade View: Grade I Tube type: Oral Number of attempts: 1 Airway Equipment and Method: stylet and LTA Placement Confirmation: ETT inserted through vocal cords under direct vision,  positive ETCO2 and breath sounds checked- equal and bilateral Secured at: 21 cm Tube secured with: Tape Dental Injury: Teeth and Oropharynx as per pre-operative assessment

## 2011-12-30 NOTE — Interval H&P Note (Signed)
History and Physical Interval Note:  12/30/2011 8:32 AM  Anne [name]  has presented today for surgery, with the diagnosis of Gallstones.  The various methods of treatment have been discussed with the patient and family. After consideration of risks, benefits and other options for treatment, the patient has consented to  Procedure(s): LAPAROSCOPIC CHOLECYSTECTOMY WITH INTRAOPERATIVE CHOLANGIOGRAM as a surgical intervention .  The patients' history has been reviewed, patient examined, no change in status, stable for surgery.  I have reviewed the patients' chart and labs.  Questions were answered to the patient's satisfaction.     Rayvin Abid

## 2011-12-30 NOTE — Progress Notes (Signed)
Rn received patient s/p lap choley. Pt had some issues coming out of surgery with nausea and pain. Pt admitted outpatient with extended recovery. Pt is sleepy but oriented and husband is at the bedside. VSS. Rn introduced self to patient and husband and they watched the mchs video. Rn told patient to call when she needs to go to the restroom for help. Rn will continue to monitor patient for any needs

## 2011-12-30 NOTE — H&P (View-Only) (Signed)
HISTORY: Pt overall doing better, but still has gnawing epigastric pain in the AM.  This feels better with xanax.  She takes prn omeprazole as well that "sometimes helps," but the most she has taken it in a row is 3 days.  Her symptoms are very vague, and I have difficulty telling exactly what her main complaints are.  She also complains of bilateral lower quadrant pain and bloating.     PERTINENT REVIEW OF SYSTEMS: Otherwise negative.    EXAM: Head: Normocephalic and atraumatic.  Eyes:  Conjunctivae are normal. Pupils are equal, round, and reactive to light. No scleral icterus.  Neck:  Normal range of motion. Neck supple. No tracheal deviation present. No thyromegaly present.  Resp: No respiratory distress, normal effort. Abd:  Abdomen is soft, non distended and non tender. No masses are palpable.  There is no rebound and no guarding.  Neurological: Alert and oriented to person, place, and time. Coordination normal.  Skin: Skin is warm and dry. No rash noted. No diaphoretic. No erythema. No pallor.  Psychiatric: Normal mood and affect. Normal behavior. Judgment and thought content normal.    ASSESSMENT AND PLAN:   Cholelithiasis Pt still does not have typical gallbladder pain, but now is tender in RUQ and never has been before.   She is waking up with gnawing hungry feeling in AM and feels better after taking a half of a xanax.   This is the best she has looked since I met her.    She was offered lap chole.  She is going to try two weeks of daily prilosec.  If no change, we will schedule lap chole.     Maudry Diego, MD Surgical Oncology, General & Endocrine Surgery [facility]  Thayer Headings, MD, MD Thayer Headings, MD

## 2011-12-30 NOTE — Progress Notes (Signed)
Pt nauseated, does not want to go home. "feels bad". Dr.Byerly notified. Plan admission

## 2011-12-31 LAB — COMPREHENSIVE METABOLIC PANEL
ALT: 77 U/L — ABNORMAL HIGH (ref 0–35)
AST: 73 U/L — ABNORMAL HIGH (ref 0–37)
CO2: 30 mEq/L (ref 19–32)
Chloride: 109 mEq/L (ref 96–112)
Creatinine, Ser: 0.91 mg/dL (ref 0.50–1.10)
GFR calc non Af Amer: 58 mL/min — ABNORMAL LOW (ref >90)
Glucose, Bld: 117 mg/dL — ABNORMAL HIGH (ref 70–99)
Total Bilirubin: 0.6 mg/dL (ref 0.3–1.2)

## 2011-12-31 LAB — CBC
MCH: 30.3 pg (ref 26.0–34.0)
MCHC: 32.6 g/dL (ref 30.0–36.0)
Platelets: 152 10*3/uL (ref 150–400)
RBC: 4.02 MIL/uL (ref 3.87–5.11)

## 2011-12-31 MED ORDER — OXYCODONE HCL 5 MG PO TABS: 5.0000 mg | ORAL_TABLET | ORAL | Status: DC | PRN

## 2011-12-31 NOTE — Discharge Summary (Signed)
Physician Discharge Summary  Patient ID: Anne [name] MRN: [id number] DOB/AGE: 02-Apr-1929 78 y.o.  Admit date: 12/30/2011 Discharge date: 12/31/2011  Admission Diagnoses: Cholelithiasis - principal problem Hypothyroidism Hyperlipidemia GERD Anxiety  Discharge Diagnoses:  Same  Discharged Condition: good  Hospital Course:  Pt was admitted to 5100 after undergoing a lap chole.  She was feeling weak, and is 78 years old, so stayed to make sure coordination and pain control was adequate.  She did well overnight, and was just complaining of some soreness.  She was able to eat and void spontaneously.    Consults: None  Significant Diagnostic Studies: labs: Normal bilirubin and CBC post op  Treatments: surgery: Lap chole with cholangiogram  Discharge Exam: Blood pressure 108/46, pulse 48, temperature 97.5 F (36.4 C), temperature source Oral, resp. rate 18, SpO2 99.00%. General appearance: alert, cooperative, appears stated age and no distress GI: soft, appropriately tender, wounds ok  Disposition: Home  Discharge Orders    Future Appointments: Provider: Department: Dept Phone: Center:   01/26/2012 4:30 PM Almond Lint, MD [facility] [phone] None     Future Orders Please Complete By Expires   Diet - low sodium heart healthy      Increase activity slowly      Call MD for:  temperature >100.4      Call MD for:  persistant nausea and vomiting      Call MD for:  severe uncontrolled pain      Call MD for:  redness, tenderness, or signs of infection (pain, swelling, redness, odor or green/yellow discharge around incision site)      Call MD for:  difficulty breathing, headache or visual disturbances        Medication List  As of 12/31/2011  7:19 AM   TAKE these medications         ALPRAZolam 0.25 MG tablet   Commonly known as: XANAX   Take 0.25 mg by mouth 2 (two) times daily.      CENTRUM PO   Take 1 tablet by mouth 3 (three) times a week.      omeprazole 20  MG capsule   Commonly known as: PRILOSEC   Take 20 mg by mouth daily.      oxyCODONE-acetaminophen 5-325 MG per tablet   Commonly known as: PERCOCET   Take 1-2 tablets by mouth every 4 (four) hours as needed for pain.      simvastatin 40 MG tablet   Commonly known as: ZOCOR   Take 20 mg by mouth every evening.      SYNTHROID 88 MCG tablet   Generic drug: levothyroxine   88 mcg.      Vitamin D3 1000 UNITS Caps   Take 1,000 Units by mouth daily.           Follow-up Information    Follow up with [facility], [place]. Schedule an appointment as soon as possible for a visit in 3 weeks.   Contact information:   [employer] [address] [place] [address] [phone]          Signed: Almond Lint 12/31/2011, 7:19 AM

## 2011-12-31 NOTE — Progress Notes (Signed)
Pt given dc instructions. Rn went over medications and incision/wound care. Pt verbalized understanding and had no questions regarding care of instructions.  

## 2012-01-01 ENCOUNTER — Encounter (HOSPITAL_COMMUNITY): Payer: Self-pay | Admitting: General Surgery

## 2012-01-26 ENCOUNTER — Ambulatory Visit (INDEPENDENT_AMBULATORY_CARE_PROVIDER_SITE_OTHER): Payer: Medicare Other | Admitting: General Surgery

## 2012-01-26 ENCOUNTER — Encounter (INDEPENDENT_AMBULATORY_CARE_PROVIDER_SITE_OTHER): Payer: Self-pay | Admitting: General Surgery

## 2012-01-26 DIAGNOSIS — K8020 Calculus of gallbladder without cholecystitis without obstruction: Secondary | ICD-10-CM

## 2012-01-26 NOTE — Assessment & Plan Note (Signed)
Pt symptoms improved, but not gone.  Recommend taking prilosec daily. Follow up with GI or PCP if nausea continues.

## 2012-01-26 NOTE — Patient Instructions (Signed)
Try taking the prilosec again daily.  If nausea continues, follow up with a gastroenterologist (GI) doctor.  Follow up PRN.

## 2012-01-26 NOTE — Progress Notes (Signed)
HISTORY: Pt doing better, but still having some bloating.  She is taking one half of a pain pill at night.  She has to take laxatives to have bowel movements.  She has nausea in the morning.  Her breath has a "chemical smell" in the AM.     EXAM: General:  Alert and oriented.   Incision:  Abdominal incisions well healed.  RUQ non tender.     PATHOLOGY: Chronic cholilithiasis   ASSESSMENT AND PLAN:   Cholelithiasis Pt symptoms improved, but not gone.  Recommend taking prilosec daily. Follow up with GI or PCP if nausea continues.       Maudry Diego, MD Surgical Oncology, General & Endocrine Surgery [facility]  Thayer Headings, MD, MD Thayer Headings, MD

## 2013-12-13 ENCOUNTER — Other Ambulatory Visit: Payer: Self-pay | Admitting: Gastroenterology

## 2013-12-13 ENCOUNTER — Ambulatory Visit
Admission: RE | Admit: 2013-12-13 | Discharge: 2013-12-13 | Disposition: A | Discharge status: Home / Self Care | Payer: Medicare Other | Source: Ambulatory Visit | Attending: Gastroenterology | Admitting: Gastroenterology

## 2013-12-13 DIAGNOSIS — K6389 Other specified diseases of intestine: Secondary | ICD-10-CM

## 2013-12-13 DIAGNOSIS — D509 Iron deficiency anemia, unspecified: Secondary | ICD-10-CM

## 2013-12-13 MED ORDER — IOHEXOL 300 MG/ML  SOLN
100.0000 mL | Freq: Once | INTRAMUSCULAR | Status: AC | PRN
  Administered 2013-12-13: 100 mL via INTRAVENOUS

## 2013-12-13 MED ORDER — IOHEXOL 300 MG/ML  SOLN
30.0000 mL | Freq: Once | INTRAMUSCULAR | Status: AC | PRN
  Administered 2013-12-13: 30 mL via ORAL

## 2013-12-14 ENCOUNTER — Ambulatory Visit (INDEPENDENT_AMBULATORY_CARE_PROVIDER_SITE_OTHER): Payer: Medicare Other | Admitting: Surgery

## 2013-12-14 ENCOUNTER — Other Ambulatory Visit (HOSPITAL_COMMUNITY): Payer: Self-pay | Admitting: Surgery

## 2013-12-14 ENCOUNTER — Other Ambulatory Visit (INDEPENDENT_AMBULATORY_CARE_PROVIDER_SITE_OTHER): Payer: Self-pay | Admitting: Surgery

## 2013-12-14 ENCOUNTER — Encounter (INDEPENDENT_AMBULATORY_CARE_PROVIDER_SITE_OTHER): Payer: Self-pay | Admitting: Surgery

## 2013-12-14 VITALS — BP 118/62 | HR 68 | Temp 98.0°F | Resp 18 | Ht 64.0 in | Wt 137.0 lb

## 2013-12-14 DIAGNOSIS — C189 Malignant neoplasm of colon, unspecified: Secondary | ICD-10-CM

## 2013-12-14 NOTE — Progress Notes (Signed)
CHEST CT 1/15 EPIC

## 2013-12-14 NOTE — Patient Instructions (Addendum)
[place]  12/14/2013   Your procedure is scheduled on:  12/19/13  TUESDAY  Report to [place] at 1100 AM.  Call this number if you have problems the morning of surgery: [phone]       Remember: BOWEL PREP Monday AS PER OFFICE  YOU MAY [place] Monday.  MAy drink clear liquids Tuesday morning until 0730 am-- then nothing by mouth   Take these medicines the morning of surgery with A SIP OF WATER: xanax, synthroid   .  Contacts, dentures or partial plates can not be worn to surgery  Leave suitcase in the car. After surgery it may be brought to your room.  For patients admitted to the hospital, checkout time is 11:00 AM day of  discharge.             SPECIAL INSTRUCTIONS- SEE [place] PREPARING FOR SURGERY INSTRUCTION SHEET-     DO NOT WEAR JEWELRY, LOTIONS, POWDERS, OR PERFUMES.  WOMEN-- DO NOT SHAVE LEGS OR UNDERARMS FOR 12 HOURS BEFORE SHOWERS. MEN MAY SHAVE FACE.  Patients discharged the day of surgery will not be allowed to drive home. IF going home the day of surgery, you must have a driver and someone to stay with you for the first 24 hours  Name and phone number of your driver:   ADMISSION                                                                     Please read over the following fact sheets that you were given:  Blood Transfusion Sheet  Information                                                                                   Anne [name]   PST [phone]  [phone]                 [place]                                                  Patient Signature _____________________________

## 2013-12-14 NOTE — Progress Notes (Addendum)
Re:   Anne [name] DOB:   Jun 30, 1929 MRN:   [id number]  ASSESSMENT AND PLAN: 1.  Colon cancer, right colon  I reviewed with the patient the findings and need for colon surgery.  I discussed both surgical and non surgical options.  I discussed the role of laparoscopic and open surgery in colon surgery.  I reviewed the risks of surgery, including, but not limited to, infection, bleeding, nerve injury, anastomotic leaks, and possibility of colostomy.  I went over the preoperative mechanical and antibiotic bowel prep for colon surgery and provided prescriptions for these.  I provided the patient literature on colon surgery.  I tried to answer all questions for the patient.  Will obtain CEA pre op and may need blood for surgery.  I spoke with daughter, [name], and son, [name], by phone.  2.  Polyp at sigmoid colon, 6 mm 3.  Diverticulosis 4.  Thyroid replacement 5.  Hypercholesterolemia 6.  Anxiety [Addendum:  Spoke with Dr. Collene Mares.  Biopsy showed adenocarcinoma.  She is also H. Pylori positive.  We will treat this post op.  DN  12/15/2013]  Chief Complaint  Patient presents with  . New Evaluation    colon   REFERRING PHYSICIAN: Thressa Sheller, MD/ Meriel Pica, MD  HISTORY OF PRESENT ILLNESS: Anne [name] is a 80 y.o. (DOB: 05-Sep-1929)  white  female whose primary care physician is Thressa Sheller, MD and comes to me today for colon cancer.  She had some vague abdominal pain a couple of years ago, which was diagnosed as gall bladder disease.  She saw Dr. Barry Dienes at that time and had a lap chole 12/2011.  Over the last 6 months, she says that she has lost 6 lbs.  But she has very non specific abdominal complaints.  Dr. Alyson Ingles followed her Hgb and it dropped from 11.8 to 9.3.  This prompted the colonoscopy.  According to her husband, Dr. Alyson Ingles has been encouraging her to have a colonoscopy for some time, but the patient has refused due to anxiety.  She had a colonoscopy by Dr.  Verdia Kuba on 12/13/2013 which revealed an ulcerated circumferential mass in the mid ascending colon.  CT scan - 12/13/2013 - 1. Ascending colonic primary with findings suspicious for transmural spread and localized pericolonic nodal metastasis. 2. No evidence of distant metastasis within the chest, abdomen, or pelvis. 3. Tiny nonspecific scattered pulmonary nodules. These can be  re-evaluated at followup.   Past Medical History  Diagnosis Date  . Thyroid disease   . Constipation   . Abdominal pain   . Abdominal distension   . Hypothyroidism   . GERD (gastroesophageal reflux disease)   . Chronic kidney disease     hx uti 1 mo ago was med tx   . Arthritis   . Depression   . Anxiety     facial twitch and click , twitch lt shoulder  due to anxiety  . No pertinent past medical history     OCC PAINS TO ABDOMEN AND CHEST , DR BYERLY PLACED PATIENT ON MED FOR GERD FOR 2 WEEKS      Past Surgical History  Procedure Laterality Date  . Bladder tack      Patient does not remember date  . Vericose vein removal      legs. does not remember date of procedure  . Abdominal hysterectomy      partial - patient does not remember date  . Appendectomy  1948  . Nose surgery  1970  . Cholecystectomy  12/30/2011    Procedure: LAPAROSCOPIC CHOLECYSTECTOMY WITH INTRAOPERATIVE CHOLANGIOGRAM;  Surgeon: Faera Byerly, MD;  Location: [facility] OR;  Service: General;  Laterality: N/A;      Current Outpatient Prescriptions  Medication Sig Dispense Refill  . ALPRAZolam (XANAX) 0.25 MG tablet Take 0.25 mg by mouth 2 (two) times daily.       . Cholecalciferol (VITAMIN D3) 1000 UNITS CAPS Take 1,000 Units by mouth daily.       . Multiple Vitamins-Minerals (CENTRUM PO) Take 1 tablet by mouth 3 (three) times a week.       . omeprazole (PRILOSEC) 20 MG capsule Take 20 mg by mouth daily.      . simvastatin (ZOCOR) 40 MG tablet Take 20 mg by mouth every evening.      . SYNTHROID 88 MCG tablet 88 mcg.        No current  facility-administered medications for this visit.     No Known Allergies  REVIEW OF SYSTEMS: Skin:  No history of rash.  No history of abnormal moles. Infection:  No history of hepatitis or HIV.  No history of MRSA. Neurologic:  No history of stroke.  No history of seizure.  No history of headaches. Cardiac:  No history of hypertension. No history of heart disease.   No history of seeing a cardiologist. Pulmonary:  Does not smoke cigarettes.  No asthma or bronchitis.  No OSA/CPAP.  Endocrine:  No diabetes. On thyroid replacement. Gastrointestinal:  No history of stomach disease.  No history of liver disease.  Cholecystectomy - 12/30/2011 - F. Byerly.  No history of pancreas disease.  Appendectomy - 1965.. GYN:  Hysterectomy - 1987 - R. Neal. Urologic:  No history of kidney stones.  No history of bladder infections. Musculoskeletal:  No history of joint or back disease. Hematologic:  Anemic according to husband.  I do not have Dr. McKenzie's labs at this time. Psycho-social:  History of depression.  History of anxiety.  SOCIAL and FAMILY HISTORY: Married.  Of [place] descent. Georgette Galloway's (#[phone]) mother.  She is the youngest daughter. Valerie Vaughn daughter. Son - Tommy Winthrop ([phone]) - , Gyn, lives in [place].  I spoke with him and outlined the planned operation.  PHYSICAL EXAM: BP 118/62  Pulse 68  Temp(Src) 98 F (36.7 C)  Resp 18  Ht 5' 4" (1.626 m)  Wt 137 lb (62.143 kg)  BMI 23.50 kg/m2  General: WF who is alert.  Somewhat anxious and little hard to understand.  She has thinning hair. HEENT: Normal. Pupils equal. Neck: Supple. No mass.  No thyroid mass. Lymph Nodes:  No supraclavicular or cervical nodes. Lungs: Clear to auscultation and symmetric breath sounds. Heart:  RRR. No murmur or rub. Breasts:  Right - no mass  Left - no mass  Abdomen: Soft. No mass. No tenderness. No hernia. Normal bowel sounds.  Fullness right abdomen.  Lower midline  scar.  RLQ scar. Rectal: Not done. Extremities:  Good strength and ROM  in upper and lower extremities. Neurologic:  Grossly intact to motor and sensory function. Psychiatric: Has normal mood and affect. Behavior is normal.   DATA REVIEWED: Epic notes   Desi Rowe, MD,  FACS [facility] [address]  [address]   [place], [place]    [address] Phone:  [phone] FAX:  [phone]  

## 2013-12-15 ENCOUNTER — Encounter (HOSPITAL_COMMUNITY): Payer: Self-pay

## 2013-12-15 ENCOUNTER — Encounter (HOSPITAL_COMMUNITY): Payer: Self-pay | Admitting: Pharmacy Technician

## 2013-12-15 ENCOUNTER — Encounter (HOSPITAL_COMMUNITY)
Admission: RE | Admit: 2013-12-15 | Discharge: 2013-12-15 | Disposition: A | Discharge status: Home / Self Care | Payer: Medicare Other | Source: Ambulatory Visit | Attending: Surgery | Admitting: Surgery

## 2013-12-15 DIAGNOSIS — D649 Anemia, unspecified: Secondary | ICD-10-CM | POA: Insufficient documentation

## 2013-12-15 DIAGNOSIS — Z01812 Encounter for preprocedural laboratory examination: Secondary | ICD-10-CM | POA: Insufficient documentation

## 2013-12-15 DIAGNOSIS — C189 Malignant neoplasm of colon, unspecified: Secondary | ICD-10-CM | POA: Insufficient documentation

## 2013-12-15 DIAGNOSIS — Z0183 Encounter for blood typing: Secondary | ICD-10-CM | POA: Insufficient documentation

## 2013-12-15 HISTORY — DX: Pruritus, unspecified: L29.9

## 2013-12-15 LAB — ABO/RH: ABO/RH(D): O POS

## 2013-12-15 LAB — COMPREHENSIVE METABOLIC PANEL
ALT: 11 U/L (ref 0–35)
AST: 15 U/L (ref 0–37)
Albumin: 2.7 g/dL — ABNORMAL LOW (ref 3.5–5.2)
Alkaline Phosphatase: 66 U/L (ref 39–117)
BILIRUBIN TOTAL: 0.2 mg/dL — AB (ref 0.3–1.2)
BUN: 15 mg/dL (ref 6–23)
CHLORIDE: 108 meq/L (ref 96–112)
CO2: 26 meq/L (ref 19–32)
CREATININE: 0.83 mg/dL (ref 0.50–1.10)
Calcium: 8.7 mg/dL (ref 8.4–10.5)
GFR calc Af Amer: 74 mL/min — ABNORMAL LOW (ref >90)
GFR, EST NON AFRICAN AMERICAN: 64 mL/min — AB (ref >90)
Glucose, Bld: 122 mg/dL — ABNORMAL HIGH (ref 70–99)
Potassium: 4.5 mEq/L (ref 3.7–5.3)
Sodium: 147 mEq/L (ref 137–147)
Total Protein: 6.5 g/dL (ref 6.0–8.3)

## 2013-12-15 LAB — CBC WITH DIFFERENTIAL/PLATELET
BASOS PCT: 0 % (ref 0–1)
Basophils Absolute: 0 10*3/uL (ref 0.0–0.1)
EOS PCT: 1 % (ref 0–5)
Eosinophils Absolute: 0.1 10*3/uL (ref 0.0–0.7)
HEMATOCRIT: 29 % — AB (ref 36.0–46.0)
HEMOGLOBIN: 8.5 g/dL — AB (ref 12.0–15.0)
Lymphocytes Relative: 19 % (ref 12–46)
Lymphs Abs: 1.3 10*3/uL (ref 0.7–4.0)
MCH: 21.6 pg — AB (ref 26.0–34.0)
MCHC: 29.3 g/dL — ABNORMAL LOW (ref 30.0–36.0)
MCV: 73.6 fL — AB (ref 78.0–100.0)
MONO ABS: 0.5 10*3/uL (ref 0.1–1.0)
Monocytes Relative: 7 % (ref 3–12)
NEUTROS ABS: 4.9 10*3/uL (ref 1.7–7.7)
NEUTROS PCT: 73 % (ref 43–77)
Platelets: 297 10*3/uL (ref 150–400)
RBC: 3.94 MIL/uL (ref 3.87–5.11)
RDW: 17.9 % — ABNORMAL HIGH (ref 11.5–15.5)
WBC: 6.8 10*3/uL (ref 4.0–10.5)

## 2013-12-15 LAB — CEA: CEA: 1.1 ng/mL (ref 0.0–5.0)

## 2013-12-15 NOTE — Progress Notes (Signed)
ekg dr Noah Delaine 11-27-2013 on chart

## 2013-12-15 NOTE — Progress Notes (Signed)
Spoke with pt spouse and made aware surgery time changed to 920am, pt told arrrive 700 am [facility] short stay per dr Lucia Gaskins, npo after midnight

## 2013-12-15 NOTE — Progress Notes (Signed)
Cbc with dif results roouted to dr Jamesetta Orleans by epic

## 2013-12-17 ENCOUNTER — Other Ambulatory Visit (INDEPENDENT_AMBULATORY_CARE_PROVIDER_SITE_OTHER): Payer: Self-pay | Admitting: Surgery

## 2013-12-18 ENCOUNTER — Encounter (INDEPENDENT_AMBULATORY_CARE_PROVIDER_SITE_OTHER): Payer: Self-pay

## 2013-12-18 ENCOUNTER — Telehealth (INDEPENDENT_AMBULATORY_CARE_PROVIDER_SITE_OTHER): Payer: Self-pay

## 2013-12-18 MED ORDER — DEXTROSE 5 % IV SOLN: 2.0000 g | INTRAVENOUS | Status: DC

## 2013-12-18 NOTE — Telephone Encounter (Signed)
Message copied by Zenovia Jordan on Mon Dec 18, 2013 12:43 PM ------      Message from: Lucia Gaskins, DAVID H      Created: Sun Dec 17, 2013  7:18 AM      Regarding: Needs to be typed and crossed for 3 units of blood       Derin Matthes,      Ms. Attia's hgb came back as 8.5.  I have put in a type and cross for 3 units of blood to be available for Tuesday.            Please check with the blood bank on Monday to make sure that they have that order.            Thanks,      Shanon Brow ------

## 2013-12-18 NOTE — Telephone Encounter (Signed)
Order given to Gomez Cleverly RN  for Type and cross / 3 units of blood for sx 12-19-13

## 2013-12-19 ENCOUNTER — Encounter (HOSPITAL_COMMUNITY): Payer: Self-pay

## 2013-12-19 ENCOUNTER — Inpatient Hospital Stay (HOSPITAL_COMMUNITY): Payer: Medicare Other | Admitting: Anesthesiology

## 2013-12-19 ENCOUNTER — Inpatient Hospital Stay (HOSPITAL_COMMUNITY)
Admission: AD | Admit: 2013-12-19 | Discharge: 2013-12-25 | DRG: 331 | Disposition: A | Discharge status: Home / Self Care | Payer: Medicare Other | Source: Ambulatory Visit | Attending: Surgery | Admitting: Surgery

## 2013-12-19 ENCOUNTER — Encounter (HOSPITAL_COMMUNITY): Payer: Medicare Other | Admitting: Anesthesiology

## 2013-12-19 ENCOUNTER — Encounter (HOSPITAL_COMMUNITY)
Admission: AD | Disposition: A | Discharge status: Home / Self Care | Payer: Self-pay | Source: Ambulatory Visit | Attending: Surgery

## 2013-12-19 DIAGNOSIS — D126 Benign neoplasm of colon, unspecified: Secondary | ICD-10-CM | POA: Diagnosis present

## 2013-12-19 DIAGNOSIS — A048 Other specified bacterial intestinal infections: Secondary | ICD-10-CM | POA: Diagnosis present

## 2013-12-19 DIAGNOSIS — F411 Generalized anxiety disorder: Secondary | ICD-10-CM | POA: Diagnosis present

## 2013-12-19 DIAGNOSIS — C182 Malignant neoplasm of ascending colon: Principal | ICD-10-CM | POA: Diagnosis present

## 2013-12-19 DIAGNOSIS — Z79899 Other long term (current) drug therapy: Secondary | ICD-10-CM

## 2013-12-19 DIAGNOSIS — C19 Malignant neoplasm of rectosigmoid junction: Secondary | ICD-10-CM

## 2013-12-19 DIAGNOSIS — E039 Hypothyroidism, unspecified: Secondary | ICD-10-CM | POA: Diagnosis present

## 2013-12-19 DIAGNOSIS — F3289 Other specified depressive episodes: Secondary | ICD-10-CM | POA: Diagnosis present

## 2013-12-19 DIAGNOSIS — D500 Iron deficiency anemia secondary to blood loss (chronic): Secondary | ICD-10-CM | POA: Diagnosis present

## 2013-12-19 DIAGNOSIS — K5730 Diverticulosis of large intestine without perforation or abscess without bleeding: Secondary | ICD-10-CM | POA: Diagnosis present

## 2013-12-19 DIAGNOSIS — E7800 Pure hypercholesterolemia, unspecified: Secondary | ICD-10-CM | POA: Diagnosis present

## 2013-12-19 DIAGNOSIS — F329 Major depressive disorder, single episode, unspecified: Secondary | ICD-10-CM | POA: Diagnosis present

## 2013-12-19 DIAGNOSIS — E875 Hyperkalemia: Secondary | ICD-10-CM | POA: Diagnosis not present

## 2013-12-19 DIAGNOSIS — R7309 Other abnormal glucose: Secondary | ICD-10-CM | POA: Diagnosis not present

## 2013-12-19 DIAGNOSIS — K219 Gastro-esophageal reflux disease without esophagitis: Secondary | ICD-10-CM | POA: Diagnosis present

## 2013-12-19 HISTORY — PX: LAPAROSCOPIC RIGHT HEMI COLECTOMY: SHX5926

## 2013-12-19 LAB — CBC
HEMATOCRIT: 30.9 % — AB (ref 36.0–46.0)
HEMOGLOBIN: 9.4 g/dL — AB (ref 12.0–15.0)
MCH: 22.5 pg — AB (ref 26.0–34.0)
MCHC: 30.4 g/dL (ref 30.0–36.0)
MCV: 73.9 fL — AB (ref 78.0–100.0)
Platelets: 248 10*3/uL (ref 150–400)
RBC: 4.18 MIL/uL (ref 3.87–5.11)
RDW: 18.1 % — ABNORMAL HIGH (ref 11.5–15.5)
WBC: 8.5 10*3/uL (ref 4.0–10.5)

## 2013-12-19 LAB — PREPARE RBC (CROSSMATCH)

## 2013-12-19 SURGERY — LAPAROSCOPIC RIGHT HEMI COLECTOMY
Anesthesia: General | Site: Abdomen | Laterality: Right

## 2013-12-19 MED ORDER — PROPOFOL 10 MG/ML IV BOLUS
INTRAVENOUS | Status: AC
  Filled 2013-12-19: qty 20

## 2013-12-19 MED ORDER — FENTANYL CITRATE 0.05 MG/ML IJ SOLN
INTRAMUSCULAR | Status: DC | PRN
  Administered 2013-12-19: 100 ug via INTRAVENOUS
  Administered 2013-12-19 (×3): 50 ug via INTRAVENOUS

## 2013-12-19 MED ORDER — SUCCINYLCHOLINE CHLORIDE 20 MG/ML IJ SOLN
INTRAMUSCULAR | Status: DC | PRN
  Administered 2013-12-19: 100 mg via INTRAVENOUS

## 2013-12-19 MED ORDER — SODIUM CHLORIDE 0.9 % IV SOLN
INTRAVENOUS | Status: DC | PRN
  Administered 2013-12-19: 10:00:00 via INTRAVENOUS

## 2013-12-19 MED ORDER — NEOSTIGMINE METHYLSULFATE 1 MG/ML IJ SOLN
INTRAMUSCULAR | Status: DC | PRN
  Administered 2013-12-19: 2 mg via INTRAVENOUS

## 2013-12-19 MED ORDER — MORPHINE SULFATE 2 MG/ML IJ SOLN
1.0000 mg | INTRAMUSCULAR | Status: DC | PRN
  Administered 2013-12-19: 1 mg via INTRAVENOUS
  Administered 2013-12-20: 2 mg via INTRAVENOUS
  Filled 2013-12-19 (×2): qty 1

## 2013-12-19 MED ORDER — KCL IN DEXTROSE-NACL 20-5-0.45 MEQ/L-%-% IV SOLN
INTRAVENOUS | Status: DC
  Administered 2013-12-19 – 2013-12-21 (×4): via INTRAVENOUS
  Filled 2013-12-19 (×6): qty 1000

## 2013-12-19 MED ORDER — ALVIMOPAN 12 MG PO CAPS
12.0000 mg | ORAL_CAPSULE | Freq: Two times a day (BID) | ORAL | Status: DC
  Administered 2013-12-20 – 2013-12-21 (×4): 12 mg via ORAL
  Filled 2013-12-19 (×6): qty 1

## 2013-12-19 MED ORDER — PEG 3350-KCL-NA BICARB-NACL 420 G PO SOLR
4000.0000 mL | Freq: Once | ORAL | Status: DC
  Filled 2013-12-19: qty 4000

## 2013-12-19 MED ORDER — CEFOTETAN DISODIUM 2 G IJ SOLR
2.0000 g | Freq: Two times a day (BID) | INTRAMUSCULAR | Status: AC
  Administered 2013-12-19: 2 g via INTRAVENOUS
  Filled 2013-12-19: qty 2

## 2013-12-19 MED ORDER — DEXAMETHASONE SODIUM PHOSPHATE 10 MG/ML IJ SOLN
INTRAMUSCULAR | Status: AC
  Filled 2013-12-19: qty 1

## 2013-12-19 MED ORDER — GLYCOPYRROLATE 0.2 MG/ML IJ SOLN
INTRAMUSCULAR | Status: DC | PRN
  Administered 2013-12-19: 0.4 mg via INTRAVENOUS

## 2013-12-19 MED ORDER — PROPOFOL 10 MG/ML IV BOLUS
INTRAVENOUS | Status: DC | PRN
  Administered 2013-12-19: 50 mg via INTRAVENOUS
  Administered 2013-12-19: 150 mg via INTRAVENOUS

## 2013-12-19 MED ORDER — ERYTHROMYCIN BASE 250 MG PO TABS
1000.0000 mg | ORAL_TABLET | ORAL | Status: DC
  Filled 2013-12-19: qty 4

## 2013-12-19 MED ORDER — DEXTROSE 5 % IV SOLN
2.0000 g | INTRAVENOUS | Status: DC
  Filled 2013-12-19: qty 2

## 2013-12-19 MED ORDER — ONDANSETRON HCL 4 MG/2ML IJ SOLN
INTRAMUSCULAR | Status: AC
  Filled 2013-12-19: qty 2

## 2013-12-19 MED ORDER — ATROPINE SULFATE 0.4 MG/ML IJ SOLN
INTRAMUSCULAR | Status: AC
  Filled 2013-12-19: qty 1

## 2013-12-19 MED ORDER — HYDROMORPHONE HCL PF 1 MG/ML IJ SOLN
0.2500 mg | INTRAMUSCULAR | Status: DC | PRN
  Administered 2013-12-19 (×2): 0.5 mg via INTRAVENOUS

## 2013-12-19 MED ORDER — HYDROMORPHONE HCL PF 1 MG/ML IJ SOLN
INTRAMUSCULAR | Status: AC
  Filled 2013-12-19: qty 1

## 2013-12-19 MED ORDER — HEPARIN SODIUM (PORCINE) 5000 UNIT/ML IJ SOLN
5000.0000 [IU] | Freq: Three times a day (TID) | INTRAMUSCULAR | Status: DC
  Administered 2013-12-19 – 2013-12-25 (×17): 5000 [IU] via SUBCUTANEOUS
  Filled 2013-12-19 (×20): qty 1

## 2013-12-19 MED ORDER — SODIUM CHLORIDE 0.9 % IJ SOLN
INTRAMUSCULAR | Status: DC | PRN
  Administered 2013-12-19: 10 mL via INTRAVENOUS

## 2013-12-19 MED ORDER — LIDOCAINE HCL 1 % IJ SOLN
INTRAMUSCULAR | Status: DC | PRN
  Administered 2013-12-19: 80 mg via INTRADERMAL

## 2013-12-19 MED ORDER — ALVIMOPAN 12 MG PO CAPS
12.0000 mg | ORAL_CAPSULE | Freq: Once | ORAL | Status: AC
  Administered 2013-12-19: 12 mg via ORAL
  Filled 2013-12-19: qty 1

## 2013-12-19 MED ORDER — BUPIVACAINE-EPINEPHRINE 0.25% -1:200000 IJ SOLN
INTRAMUSCULAR | Status: DC | PRN
  Administered 2013-12-19: 5 mL

## 2013-12-19 MED ORDER — ATROPINE SULFATE 0.4 MG/ML IJ SOLN
INTRAMUSCULAR | Status: DC | PRN
  Administered 2013-12-19: 0.4 mg via INTRAVENOUS

## 2013-12-19 MED ORDER — GLYCOPYRROLATE 0.2 MG/ML IJ SOLN
INTRAMUSCULAR | Status: AC
  Filled 2013-12-19: qty 2

## 2013-12-19 MED ORDER — ONDANSETRON HCL 4 MG/2ML IJ SOLN
INTRAMUSCULAR | Status: DC | PRN
  Administered 2013-12-19: 4 mg via INTRAVENOUS

## 2013-12-19 MED ORDER — DEXAMETHASONE SODIUM PHOSPHATE 10 MG/ML IJ SOLN
INTRAMUSCULAR | Status: DC | PRN
  Administered 2013-12-19: 10 mg via INTRAVENOUS

## 2013-12-19 MED ORDER — ONDANSETRON HCL 4 MG/2ML IJ SOLN: 4.0000 mg | Freq: Four times a day (QID) | INTRAMUSCULAR | Status: DC | PRN

## 2013-12-19 MED ORDER — BUPIVACAINE LIPOSOME 1.3 % IJ SUSP
20.0000 mL | Freq: Once | INTRAMUSCULAR | Status: AC
  Administered 2013-12-19: 20 mL
  Filled 2013-12-19: qty 20

## 2013-12-19 MED ORDER — SODIUM CHLORIDE 0.9 % IJ SOLN
INTRAMUSCULAR | Status: AC
  Filled 2013-12-19: qty 10

## 2013-12-19 MED ORDER — LACTATED RINGERS IV SOLN
INTRAVENOUS | Status: DC
  Administered 2013-12-19: 1000 mL via INTRAVENOUS
  Administered 2013-12-19: 10:00:00 via INTRAVENOUS

## 2013-12-19 MED ORDER — HEPARIN SODIUM (PORCINE) 5000 UNIT/ML IJ SOLN
5000.0000 [IU] | Freq: Once | INTRAMUSCULAR | Status: AC
  Administered 2013-12-19: 5000 [IU] via SUBCUTANEOUS
  Filled 2013-12-19: qty 1

## 2013-12-19 MED ORDER — ONDANSETRON HCL 4 MG PO TABS: 4.0000 mg | ORAL_TABLET | Freq: Four times a day (QID) | ORAL | Status: DC | PRN

## 2013-12-19 MED ORDER — LACTATED RINGERS IV SOLN
INTRAVENOUS | Status: DC
  Administered 2013-12-19: 1000 mL via INTRAVENOUS

## 2013-12-19 MED ORDER — NEOMYCIN SULFATE 500 MG PO TABS
1000.0000 mg | ORAL_TABLET | ORAL | Status: DC
  Filled 2013-12-19: qty 2

## 2013-12-19 MED ORDER — BUPIVACAINE HCL (PF) 0.25 % IJ SOLN
INTRAMUSCULAR | Status: AC
  Filled 2013-12-19: qty 30

## 2013-12-19 MED ORDER — FENTANYL CITRATE 0.05 MG/ML IJ SOLN
INTRAMUSCULAR | Status: AC
  Filled 2013-12-19: qty 5

## 2013-12-19 MED ORDER — CISATRACURIUM BESYLATE (PF) 10 MG/5ML IV SOLN
INTRAVENOUS | Status: DC | PRN
  Administered 2013-12-19: 6 mg via INTRAVENOUS
  Administered 2013-12-19: 3 mg via INTRAVENOUS
  Administered 2013-12-19: 4 mg via INTRAVENOUS

## 2013-12-19 MED ORDER — PROMETHAZINE HCL 25 MG/ML IJ SOLN: 6.2500 mg | INTRAMUSCULAR | Status: DC | PRN

## 2013-12-19 SURGICAL SUPPLY — 78 items
APPLIER CLIP 5 13 M/L LIGAMAX5 (MISCELLANEOUS)
APPLIER CLIP ROT 10 11.4 M/L (STAPLE)
APR CLP MED LRG 11.4X10 (STAPLE)
BLADE EXTENDED COATED 6.5IN (ELECTRODE) ×3 IMPLANT
BLADE HEX COATED 2.75 (ELECTRODE) ×3 IMPLANT
BLADE SURG SZ10 CARB STEEL (BLADE) ×3 IMPLANT
CANISTER SUCTION 2500CC (MISCELLANEOUS) ×6 IMPLANT
CELLS DAT CNTRL 66122 CELL SVR (MISCELLANEOUS) ×1 IMPLANT
CLAMP ENDO BABCK 10MM (STAPLE) IMPLANT
CLIP APPLIE 5 13 M/L LIGAMAX5 (MISCELLANEOUS) IMPLANT
CLIP APPLIE ROT 10 11.4 M/L (STAPLE) IMPLANT
CLOSURE WOUND 1/2 X4 (GAUZE/BANDAGES/DRESSINGS)
CONNECTOR 5 IN 1 STRAIGHT STRL (MISCELLANEOUS) IMPLANT
COVER MAYO STAND STRL (DRAPES) ×3 IMPLANT
COVER SURGICAL LIGHT HANDLE (MISCELLANEOUS) IMPLANT
DECANTER SPIKE VIAL GLASS SM (MISCELLANEOUS) ×3 IMPLANT
DEVICE TROCAR PUNCTURE CLOSURE (ENDOMECHANICALS) IMPLANT
DRAPE LAPAROSCOPIC ABDOMINAL (DRAPES) ×3 IMPLANT
DRAPE LG THREE QUARTER DISP (DRAPES) IMPLANT
DRAPE WARM FLUID 44X44 (DRAPE) ×3 IMPLANT
DRSG OPSITE POSTOP 4X6 (GAUZE/BANDAGES/DRESSINGS) ×3 IMPLANT
DRSG TEGADERM 2-3/8X2-3/4 SM (GAUZE/BANDAGES/DRESSINGS) ×3 IMPLANT
ELECT REM PT RETURN 9FT ADLT (ELECTROSURGICAL) ×3
ELECTRODE REM PT RTRN 9FT ADLT (ELECTROSURGICAL) ×1 IMPLANT
ENSEAL DEVICE STD TIP 35CM (ENDOMECHANICALS) IMPLANT
GLOVE BIOGEL PI IND STRL 7.0 (GLOVE) ×6 IMPLANT
GLOVE BIOGEL PI INDICATOR 7.0 (GLOVE) ×12
GOWN STRL REUS W/TWL LRG LVL3 (GOWN DISPOSABLE) IMPLANT
GOWN STRL REUS W/TWL XL LVL3 (GOWN DISPOSABLE) ×33 IMPLANT
KIT BASIN OR (CUSTOM PROCEDURE TRAY) ×3 IMPLANT
LEGGING LITHOTOMY PAIR STRL (DRAPES) IMPLANT
LIGASURE IMPACT 36 18CM CVD LR (INSTRUMENTS) IMPLANT
NS IRRIG 1000ML POUR BTL (IV SOLUTION) ×6 IMPLANT
PENCIL BUTTON HOLSTER BLD 10FT (ELECTRODE) ×3 IMPLANT
RELOAD PROXIMATE 75MM BLUE (ENDOMECHANICALS) ×3 IMPLANT
RTRCTR WOUND ALEXIS 18CM MED (MISCELLANEOUS) ×3
SCALPEL HARMONIC ACE (MISCELLANEOUS) ×3 IMPLANT
SCISSORS LAP 5X35 DISP (ENDOMECHANICALS) ×3 IMPLANT
SET IRRIG TUBING LAPAROSCOPIC (IRRIGATION / IRRIGATOR) ×3 IMPLANT
SLEEVE XCEL OPT CAN 5 100 (ENDOMECHANICALS) ×3 IMPLANT
SLEEVE Z-THREAD 5X100MM (TROCAR) IMPLANT
SOLUTION ANTI FOG 6CC (MISCELLANEOUS) ×3 IMPLANT
SPONGE GAUZE 4X4 12PLY (GAUZE/BANDAGES/DRESSINGS) ×3 IMPLANT
SPONGE LAP 18X18 X RAY DECT (DISPOSABLE) ×6 IMPLANT
STAPLER PROXIMATE 75MM BLUE (STAPLE) ×3 IMPLANT
STAPLER VISISTAT 35W (STAPLE) ×3 IMPLANT
STRIP CLOSURE SKIN 1/2X4 (GAUZE/BANDAGES/DRESSINGS) IMPLANT
SUCTION POOLE TIP (SUCTIONS) ×3 IMPLANT
SUT PDS AB 1 CT1 27 (SUTURE) ×6 IMPLANT
SUT PDS AB 1 CTX 36 (SUTURE) IMPLANT
SUT PDS AB 4-0 SH 27 (SUTURE) IMPLANT
SUT PROLENE 2 0 KS (SUTURE) IMPLANT
SUT SILK 2 0 (SUTURE) ×2
SUT SILK 2 0 SH CR/8 (SUTURE) ×3 IMPLANT
SUT SILK 2-0 18XBRD TIE 12 (SUTURE) ×1 IMPLANT
SUT SILK 3 0 (SUTURE) ×2
SUT SILK 3 0 SH CR/8 (SUTURE) ×6 IMPLANT
SUT SILK 3-0 18XBRD TIE 12 (SUTURE) ×1 IMPLANT
SUT VIC AB 2-0 CT1 27 (SUTURE)
SUT VIC AB 2-0 CT1 27XBRD (SUTURE) IMPLANT
SUT VIC AB 3-0 PS2 18 (SUTURE)
SUT VIC AB 3-0 PS2 18XBRD (SUTURE) IMPLANT
SUT VIC AB 4-0 SH 18 (SUTURE) IMPLANT
SUT VICRYL 0 UR6 27IN ABS (SUTURE) ×6 IMPLANT
SYR 30ML LL (SYRINGE) ×3 IMPLANT
SYR BULB IRRIGATION 50ML (SYRINGE) ×3 IMPLANT
TOWEL OR 17X26 10 PK STRL BLUE (TOWEL DISPOSABLE) ×9 IMPLANT
TRAY FOLEY CATH 14FRSI W/METER (CATHETERS) ×3 IMPLANT
TRAY LAP CHOLE (CUSTOM PROCEDURE TRAY) ×3 IMPLANT
TROCAR BLADELESS OPT 5 100 (ENDOMECHANICALS) ×9 IMPLANT
TROCAR HASSON GELL 12X100 (TROCAR) ×3 IMPLANT
TROCAR XCEL NON-BLD 11X100MML (ENDOMECHANICALS) IMPLANT
TROCAR XCEL UNIV SLVE 11M 100M (ENDOMECHANICALS) IMPLANT
TUBING CONNECTING 10 (TUBING) IMPLANT
TUBING CONNECTING 10' (TUBING)
TUBING FILTER THERMOFLATOR (ELECTROSURGICAL) ×3 IMPLANT
YANKAUER SUCT BULB TIP 10FT TU (MISCELLANEOUS) ×6 IMPLANT
YANKAUER SUCT BULB TIP NO VENT (SUCTIONS) ×3 IMPLANT

## 2013-12-19 NOTE — Anesthesia Procedure Notes (Signed)
Procedure Name: Intubation Date/Time: 12/19/2013 9:41 AM Performed by: Frances Furbish Pre-anesthesia Checklist: Patient identified, Emergency Drugs available, Suction available and Timeout performed Patient Re-evaluated:Patient Re-evaluated prior to inductionOxygen Delivery Method: Circle system utilized Preoxygenation: Pre-oxygenation with 100% oxygen Intubation Type: IV induction Ventilation: Mask ventilation without difficulty Laryngoscope Size: Mac and 3 Grade View: Grade I Tube type: Oral Tube size: 8.0 mm Number of attempts: 1 Placement Confirmation: ETT inserted through vocal cords under direct vision,  positive ETCO2 and breath sounds checked- equal and bilateral Secured at: 22 cm Tube secured with: Tape Dental Injury: Teeth and Oropharynx as per pre-operative assessment

## 2013-12-19 NOTE — Progress Notes (Signed)
Patient states most stools have been liquid brown except one this morning was a small soft brown ball.

## 2013-12-19 NOTE — Interval H&P Note (Signed)
History and Physical Interval Note:  12/19/2013 9:22 AM  Anne [name]  has presented today for surgery, with the diagnosis of colon cancer  The various methods of treatment have been discussed with the patient and family.  Husband is here today.  After consideration of risks, benefits and other options for treatment, the patient has consented to  Procedure(s): LAPAROSCOPIC RIGHT HEMI COLECTOMY (Right) as a surgical intervention .    The patient's history has been reviewed, patient examined, no change in status, stable for surgery.  I have reviewed the patient's chart and labs.  Questions were answered to the patient's satisfaction.     [name] H

## 2013-12-19 NOTE — Anesthesia Preprocedure Evaluation (Addendum)
Anesthesia Evaluation  Patient identified by MRN, date of birth, ID band Patient awake    Reviewed: Allergy & Precautions, H&P , NPO status , Patient's Chart, lab work & pertinent test results  Airway Mallampati: II TM Distance: >3 FB Neck ROM: Full    Dental no notable dental hx.    Pulmonary neg pulmonary ROS,  breath sounds clear to auscultation  Pulmonary exam normal       Cardiovascular negative cardio ROS  Rhythm:Regular Rate:Normal     Neuro/Psych negative neurological ROS  negative psych ROS   GI/Hepatic Neg liver ROS, GERD-  Medicated,  Endo/Other  Hypothyroidism   Renal/GU negative Renal ROS  negative genitourinary   Musculoskeletal negative musculoskeletal ROS (+)   Abdominal   Peds negative pediatric ROS (+)  Hematology  (+) anemia ,   Anesthesia Other Findings   Reproductive/Obstetrics negative OB ROS                           Anesthesia Physical Anesthesia Plan  ASA: III  Anesthesia Plan: General   Post-op Pain Management:    Induction: Intravenous  Airway Management Planned: Oral ETT  Additional Equipment:   Intra-op Plan:   Post-operative Plan: Extubation in OR  Informed Consent: I have reviewed the patients History and Physical, chart, labs and discussed the procedure including the risks, benefits and alternatives for the proposed anesthesia with the patient or authorized representative who has indicated his/her understanding and acceptance.   Dental advisory given  Plan Discussed with: CRNA and Surgeon  Anesthesia Plan Comments:         Anesthesia Quick Evaluation

## 2013-12-19 NOTE — Anesthesia Postprocedure Evaluation (Signed)
  Anesthesia Post-op Note  Patient: Anne [name]  Procedure(s) Performed: Procedure(s) (LRB): LAPAROSCOPIC RIGHT HEMI COLECTOMY (Right)  Patient Location: PACU  Anesthesia Type: General  Level of Consciousness: awake and alert   Airway and Oxygen Therapy: Patient Spontanous Breathing  Post-op Pain: mild  Post-op Assessment: Post-op Vital signs reviewed, Patient's Cardiovascular Status Stable, Respiratory Function Stable, Patent Airway and No signs of Nausea or vomiting  Last Vitals:  Filed Vitals:   12/19/13 1300  BP: 164/60  Pulse: 57  Temp:   Resp: 15    Post-op Vital Signs: stable   Complications: No apparent anesthesia complications

## 2013-12-19 NOTE — H&P (View-Only) (Signed)
Re:   Anne [name] DOB:   Jun 30, 1929 MRN:   [id number]  ASSESSMENT AND PLAN: 1.  Colon cancer, right colon  I reviewed with the patient the findings and need for colon surgery.  I discussed both surgical and non surgical options.  I discussed the role of laparoscopic and open surgery in colon surgery.  I reviewed the risks of surgery, including, but not limited to, infection, bleeding, nerve injury, anastomotic leaks, and possibility of colostomy.  I went over the preoperative mechanical and antibiotic bowel prep for colon surgery and provided prescriptions for these.  I provided the patient literature on colon surgery.  I tried to answer all questions for the patient.  Will obtain CEA pre op and may need blood for surgery.  I spoke with daughter, Anne [name], and son, Anne [name], by phone.  2.  Polyp at sigmoid colon, 6 mm 3.  Diverticulosis 4.  Thyroid replacement 5.  Hypercholesterolemia 6.  Anxiety [Addendum:  Spoke with Dr. Collene Mares.  Biopsy showed adenocarcinoma.  She is also H. Pylori positive.  We will treat this post op.  DN  12/15/2013]  Chief Complaint  Patient presents with  . New Evaluation    colon   REFERRING PHYSICIAN: Thressa Sheller, MD/ Meriel Pica, MD  HISTORY OF PRESENT ILLNESS: Anne [name] is a 80 y.o. (DOB: 05-Sep-1929)  white  female whose primary care physician is Thressa Sheller, MD and comes to me today for colon cancer.  She had some vague abdominal pain a couple of years ago, which was diagnosed as gall bladder disease.  She saw Dr. Barry Dienes at that time and had a lap chole 12/2011.  Over the last 6 months, she says that she has lost 6 lbs.  But she has very non specific abdominal complaints.  Dr. Alyson Ingles followed her Hgb and it dropped from 11.8 to 9.3.  This prompted the colonoscopy.  According to her husband, Dr. Alyson Ingles has been encouraging her to have a colonoscopy for some time, but the patient has refused due to anxiety.  She had a colonoscopy by Dr.  Verdia Kuba on 12/13/2013 which revealed an ulcerated circumferential mass in the mid ascending colon.  CT scan - 12/13/2013 - 1. Ascending colonic primary with findings suspicious for transmural spread and localized pericolonic nodal metastasis. 2. No evidence of distant metastasis within the chest, abdomen, or pelvis. 3. Tiny nonspecific scattered pulmonary nodules. These can be  re-evaluated at followup.   Past Medical History  Diagnosis Date  . Thyroid disease   . Constipation   . Abdominal pain   . Abdominal distension   . Hypothyroidism   . GERD (gastroesophageal reflux disease)   . Chronic kidney disease     hx uti 1 mo ago was med tx   . Arthritis   . Depression   . Anxiety     facial twitch and click , twitch lt shoulder  due to anxiety  . No pertinent past medical history     OCC PAINS TO ABDOMEN AND CHEST , DR BYERLY PLACED PATIENT ON MED FOR GERD FOR 2 WEEKS      Past Surgical History  Procedure Laterality Date  . Bladder tack      Patient does not remember date  . Vericose vein removal      legs. does not remember date of procedure  . Abdominal hysterectomy      partial - patient does not remember date  . Appendectomy  1948  . Nose surgery  1970  . Cholecystectomy  12/30/2011    Procedure: LAPAROSCOPIC CHOLECYSTECTOMY WITH INTRAOPERATIVE CHOLANGIOGRAM;  Surgeon: Stark Klein, MD;  Location: [facility];  Service: General;  Laterality: N/A;      Current Outpatient Prescriptions  Medication Sig Dispense Refill  . ALPRAZolam (XANAX) 0.25 MG tablet Take 0.25 mg by mouth 2 (two) times daily.       . Cholecalciferol (VITAMIN D3) 1000 UNITS CAPS Take 1,000 Units by mouth daily.       . Multiple Vitamins-Minerals (CENTRUM PO) Take 1 tablet by mouth 3 (three) times a week.       Marland Kitchen omeprazole (PRILOSEC) 20 MG capsule Take 20 mg by mouth daily.      . simvastatin (ZOCOR) 40 MG tablet Take 20 mg by mouth every evening.      Marland Kitchen SYNTHROID 88 MCG tablet 88 mcg.        No current  facility-administered medications for this visit.     No Known Allergies  REVIEW OF SYSTEMS: Skin:  No history of rash.  No history of abnormal moles. Infection:  No history of hepatitis or HIV.  No history of MRSA. Neurologic:  No history of stroke.  No history of seizure.  No history of headaches. Cardiac:  No history of hypertension. No history of heart disease.   No history of seeing a cardiologist. Pulmonary:  Does not smoke cigarettes.  No asthma or bronchitis.  No OSA/CPAP.  Endocrine:  No diabetes. On thyroid replacement. Gastrointestinal:  No history of stomach disease.  No history of liver disease.  Cholecystectomy - 12/30/2011 - F. Byerly.  No history of pancreas disease.  Appendectomy - 1965.Marland Kitchen GYN:  Hysterectomy - 1987 - R. Neal. Urologic:  No history of kidney stones.  No history of bladder infections. Musculoskeletal:  No history of joint or back disease. Hematologic:  Anemic according to husband.  I do not have Dr. Noland Fordyce labs at this time. Psycho-social:  History of depression.  History of anxiety.  SOCIAL and FAMILY HISTORY: Married.  Of [place] descent. Georgette Galloway's [id number]) mother.  She is the youngest daughter. Iantha Fallen daughter. Son - Anne [name] Rabadan [phone]) - , Concha Norway, lives in [place].  I spoke with him and outlined the planned operation.  PHYSICAL EXAM: BP 118/62  Pulse 68  Temp(Src) 98 F (36.7 C)  Resp 18  Ht 5\' 4"  (1.626 m)  Wt 137 lb (62.143 kg)  BMI 23.50 kg/m2  General: WF who is alert.  Somewhat anxious and little hard to understand.  She has thinning hair. HEENT: Normal. Pupils equal. Neck: Supple. No mass.  No thyroid mass. Lymph Nodes:  No supraclavicular or cervical nodes. Lungs: Clear to auscultation and symmetric breath sounds. Heart:  RRR. No murmur or rub. Breasts:  Right - no mass  Left - no mass  Abdomen: Soft. No mass. No tenderness. No hernia. Normal bowel sounds.  Fullness right abdomen.  Lower midline  scar.  RLQ scar. Rectal: Not done. Extremities:  Good strength and ROM  in upper and lower extremities. Neurologic:  Grossly intact to motor and sensory function. Psychiatric: Has normal mood and affect. Behavior is normal.   DATA REVIEWED: Epic notes   Alphonsa Overall, MD,  [facility] [address] [place]  [place], [place]    [place] Phone:  [place]:  [phone]

## 2013-12-19 NOTE — Transfer of Care (Signed)
Immediate Anesthesia Transfer of Care Note  Patient: Anne [name]  Procedure(s) Performed: Procedure(s): LAPAROSCOPIC RIGHT HEMI COLECTOMY (Right)  Patient Location: PACU  Anesthesia Type:General  Level of Consciousness: sedated  Airway & Oxygen Therapy: Patient Spontanous Breathing and Patient connected to face mask oxygen  Post-op Assessment: Report given to PACU RN  Post vital signs: Reviewed and stable  Complications: No apparent anesthesia complications

## 2013-12-20 ENCOUNTER — Encounter (HOSPITAL_COMMUNITY): Payer: Self-pay | Admitting: Surgery

## 2013-12-20 LAB — CBC
HEMATOCRIT: 29.9 % — AB (ref 36.0–46.0)
Hemoglobin: 9.1 g/dL — ABNORMAL LOW (ref 12.0–15.0)
MCH: 22.4 pg — ABNORMAL LOW (ref 26.0–34.0)
MCHC: 30.4 g/dL (ref 30.0–36.0)
MCV: 73.6 fL — AB (ref 78.0–100.0)
Platelets: 269 10*3/uL (ref 150–400)
RBC: 4.06 MIL/uL (ref 3.87–5.11)
RDW: 17.8 % — ABNORMAL HIGH (ref 11.5–15.5)
WBC: 9.4 10*3/uL (ref 4.0–10.5)

## 2013-12-20 LAB — BASIC METABOLIC PANEL
BUN: 7 mg/dL (ref 6–23)
CHLORIDE: 108 meq/L (ref 96–112)
CO2: 28 meq/L (ref 19–32)
Calcium: 8.1 mg/dL — ABNORMAL LOW (ref 8.4–10.5)
Creatinine, Ser: 0.78 mg/dL (ref 0.50–1.10)
GFR calc non Af Amer: 76 mL/min — ABNORMAL LOW (ref >90)
GFR, EST AFRICAN AMERICAN: 88 mL/min — AB (ref >90)
Glucose, Bld: 252 mg/dL — ABNORMAL HIGH (ref 70–99)
Potassium: 4.7 mEq/L (ref 3.7–5.3)
SODIUM: 143 meq/L (ref 137–147)

## 2013-12-20 NOTE — Progress Notes (Signed)
Inpatient Diabetes Program Recommendations  AACE/ADA: New Consensus Statement on Inpatient Glycemic Control (2013)  Target Ranges:  Prepandial:   less than 140 mg/dL      Peak postprandial:   less than 180 mg/dL (1-2 hours)      Critically ill patients:  140 - 180 mg/dL   Reason for Visit: Results for EVOLETT, SOMARRIBA (MRN [id number]) as of 12/20/2013 12:11  Ref. Range 12/20/2013 03:40  Glucose Latest Range: 70-99 mg/dL 252 (H)  No history of diabetes.  Lab glucose elevated.  Patient did receive Dexmethasone 10 mg once yesterday. Recheck lab glucose on 12/21/13.  If greater than 150 mg/dL, may consider CBG's q 4 hours and Novolog sensitive correction.  Thanks, Adah Perl, RN, BC-ADM Inpatient Diabetes Coordinator Pager [phone]

## 2013-12-20 NOTE — Op Note (Signed)
NAMEALKA, FALWELL NO.:  [id number]  MEDICAL RECORD NO.:  [id number]  LOCATION:  9983                         FACILITY:  [facility]  PHYSICIAN:  Fenton Malling. Lucia Gaskins, M.D.  DATE OF BIRTH:  1929-04-23  DATE OF PROCEDURE:  12/19/2013                              OPERATIVE REPORT   PREOPERATIVE DIAGNOSIS:  Right colon cancer.  POSTOPERATIVE DIAGNOSIS:  Right colon cancer.  PROCEDURE:  Laparoscopic-assisted right hemicolectomy.  SURGEON:  Fenton Malling. Lucia Gaskins, M.D.  FIRST ASSISTANT:  Odis Hollingshead, M.D.  ANESTHESIA:  General endotracheal, supervised by Dr. Myrtie Soman.  ESTIMATED BLOOD LOSS:  75 mL.  She was given 1 unit of blood intraoperatively because of preop anemia.  There were no drains.  I used 20 mL of Exparel as a local anesthetic.  SPECIMEN:  Right colon.  COUNTS:  Her sponge count was correct.  INDICATION FOR PROCEDURE:  Anne [name] is an 80 year old white female, who sees Dr. Thressa Sheller, her primary care doctor.  She had presented with anemia, underwent a colonoscopy by Dr. Juanita Craver on December 13, 2013, which revealed an ulcerative circumferential lesion of her right colon.  Biopsy showed an adenocarcinoma and she comes for right hemicolectomy.  The indications and potential complication of surgery were explained to the patient.  Potential complications include, but are not limited to, bleeding, infection, nerve injury, anastomotic leak, and the possibility of colostomy.  The patient was anemic and was given one unit of blood at the beginning of the surgery.  OPERATIVE NOTE:  The patient was taken to room #1 at [facility].  She underwent a general anesthesia supervised by Dr. Myrtie Soman.  She was given 2 g of cefotetan at the initiation of procedure.    A time-out was held and surgical checklist run.  Her abdomen was prepped with ChloraPrep and sterilely draped.  She had a Foley catheter in place.  Because of her multiple prior abdominal  operations, I went through an infraumbilical incision with sharp dissection carried down to the abdominal cavity.  I placed a 12 mm Hasson trocar in to peritoneal cavity and insufflated the abdomen.  She did have adhesions of omentum to her anterior abdominal wall, these did not appear to be related to the malignancy.  The right and left lobes of liver were unremarkable for any obvious disease.  She had adhesions to her gallbladder fossa from her prior cholecystectomy.  The bowel that I could see was unremarkable, and the pelvis that I could see was otherwise unremarkable.  I placed three additional trocars: a 5 mm trocar in right lower quadrant, 5-mm at left lower quadrant, and 5-mm at left upper quadrant.  I started dissection at the terminal ileum.  I was able to reflect the small bowel mesentery at the pelvic brim up and mobilized the cecum.  I identified the right ureter which was posterior to dissection.  I carried the dissection posterior to the right colon up to the duodenal.  I went along laterally along the edge of the colon, it looked like it may be attached in some respect to the peritoneal surface, and I took this attachment with the specimen.  The hepatic flexure  was tucked in the gallbladder fossa.  I then identified the duodenal from above and was able to mobilize the entire right colon to the midline exposing the duodenum.  There was no gross disease in the right colon gutter that I left behind.  At this point, I made a 6-cm incision above her umbilicus to resect the bowel and perform the anastomosis.  I placed a wound protector in the woundand then eviscerated the right colon.   I divided the right colon at the right midtransverse colon with a 75 GIA stapler.  I divided the terminal small bowel with a 75 GIA stapler and then divided the mesentery with a combination of harmonic scapel and 2-0 silk suture ligatures.  I got down to the base of the mesentery.  She did have at least one large  lymph node in the specimen, but there was no gross evidence of any disease left in her abdominal cavity.  I sent the right colon specimen to pathology.  I then did a side-to-side stapled anastomosis to the right transverse colon to the terminal ileum using a 75 GIA stapler.  I made enterotomies in both of these and fired the stapler, and then closed the enterotomies with interrupted 3-0 silk suture.  The bowel looked viable.  There was no evidence of bleeding.  I placed a suture in the crotch.  I sewed the ends of the ileum and colon together.  I did not try to close the mesenteric defect, but returned the anastomosis to the right colon gutter.  I then irrigated the abdomen out with 2 liters of saline.  At this time, we changed gown and gloves, instruments, and removed the wound protector.  Using new insturments, I closed the abdomen with two running #1 PDS sutures.  I returned laparoscopically, I infiltrated the wound with 20 mL of Exparel, mixed with 10 mL of saline for total of 30 mL.  I closed the umbilical wound with a couple of interrupted Vicryl sutures. The other trocars were removed in turn.  I irrigated all the wounds with saline and closed the skin with skin staples.  The sponge count and needle count were correct at the end of the case.  She was transferred to the recovery room in good condition.  The patient tolerated the procedure well.  She was admitted anemic with a hemoglobin of 8.5, was given 1 unit of blood at the initiation of procedure because of her anemia.  Estimated blood loss was about 75 mL. We will leave the Foley and she will go to Step-Down ICU for the night.   Fenton Malling. Lucia Gaskins, M.D.,  FACS   DHN/MEDQ  D:  12/19/2013  T:  12/20/2013  Job:  [id number]  cc:   Nelwyn Salisbury, MD, Illene Regulus, M.D.

## 2013-12-20 NOTE — Progress Notes (Signed)
CARE MANAGEMENT NOTE 12/20/2013  Patient:  Anne [name], Anne [name]   Account Number:  [id number]  Date Initiated:  12/20/2013  Documentation initiated by:  DAVIS,RHONDA  Subjective/Objective Assessment:   80 yo f with diverticlitis and requiring colostomy, in sdu postop due to bradycardia and wound care.  transferred to med surg on [id number].     Action/Plan:   home when stable/hx of colon ca of the ascending.  will follow for hhc needs.   Anticipated DC Date:  12/23/2013   Anticipated DC Plan:  HOME/SELF CARE  In-house referral  NA      DC Planning Services  NA      [facility] Choice  NA   Choice offered to / List presented to:  NA   DME arranged  NA      DME agency  NA     [place] arranged  NA      [place] agency  NA   Status of service:  In process, will continue to follow Medicare Important Message given?  NA - LOS <3 / Initial given by admissions (If response is "NO", the following Medicare IM given date fields will be blank) Date Medicare IM given:   Date Additional Medicare IM given:    Discharge Disposition:    Per UR Regulation:  Reviewed for med. necessity/level of care/duration of stay  If discussed at [place] of Stay Meetings, dates discussed:    Comments:  [id number]/Rhonda Eldridge Dace, BSN, [place] [phone] Chart Reviewed for discharge and hospital needs. Discharge needs at time of review:  None present will follow for needs. Review of patient progress due on [id number].

## 2013-12-20 NOTE — Progress Notes (Signed)
General Surgery Note  LOS: 1 day  POD -  1 Day Post-Op  Assessment/Plan: 1.  LAPAROSCOPIC RIGHT HEMI COLECTOMY - 12/19/2013 - Anne [name]  In step down ICU - doing well  Will remove foley and transfer to floor  Still with ileus - will keep NPO  2.  DVT prophylaxis - SQ Heparin 3.  Anemia - Chronic secondary to GI blood loss 4. Diverticulosis  5. Thyroid replacement  6. Hypercholesterolemia  7. Anxiety   Active Problems:   Cancer of ascending colon  Subjective:  Doing well.  Minimal abdominal pain.  No complaints. Objective:   Filed Vitals:   12/20/13 0700  BP:   Pulse: 44  Temp:   Resp: 17     Intake/Output from previous day:  01/20 0701 - 01/21 0700 In: 5275 [I.V.:4950; Blood:325] Out: 8341 [Urine:3850; Blood:75]  Intake/Output this shift:      Physical Exam:   General: WN thinner WF who is alert and oriented.    HEENT: Normal. Pupils equal. .   Lungs: Clear.  IS at about 800 cc, but has a little trouble understanding how it works.   Abdomen: Soft.  Rare BS.   Wound: Clean.   Lab Results:    Recent Labs  12/19/13 1300 12/20/13 0340  WBC 8.5 9.4  HGB 9.4* 9.1*  HCT 30.9* 29.9*  PLT 248 269    BMET   Recent Labs  12/20/13 0340  NA 143  K 4.7  CL 108  CO2 28  GLUCOSE 252*  BUN 7  CREATININE 0.78  CALCIUM 8.1*    PT/INR  No results found for this basename: LABPROT, INR,  in the last 72 hours  ABG  No results found for this basename: PHART, PCO2, PO2, HCO3,  in the last 72 hours   Studies/Results:  No results found.   Anti-infectives:   Anti-infectives   Start     Dose/Rate Route Frequency Ordered Stop   12/19/13 2000  cefoTEtan (CEFOTAN) 2 g in dextrose 5 % 50 mL IVPB     2 g 100 mL/hr over 30 Minutes Intravenous Every 12 hours 12/19/13 1548 12/19/13 2038   12/19/13 0726  neomycin (MYCIFRADIN) tablet 1,000 mg  Status:  Discontinued     1,000 mg Oral 3 times per day 12/19/13 0726 12/19/13 1543   12/19/13 0726  erythromycin (E-MYCIN)  tablet 1,000 mg  Status:  Discontinued     1,000 mg Oral 3 times per day 12/19/13 0726 12/19/13 1543   12/19/13 0726  cefoTEtan (CEFOTAN) 2 g in dextrose 5 % 50 mL IVPB  Status:  Discontinued     2 g 100 mL/hr over 30 Minutes Intravenous 30 min pre-op 12/19/13 0726 12/19/13 1543   12/19/13 0600  cefoTEtan (CEFOTAN) 2 g in dextrose 5 % 50 mL IVPB  Status:  Discontinued     2 g 100 mL/hr over 30 Minutes Intravenous On call to O.R. 12/18/13 1847 12/18/13 1858   12/19/13 0600  cefoTEtan (CEFOTAN) 2 g in dextrose 5 % 50 mL IVPB  Status:  Discontinued     2 g 100 mL/hr over 30 Minutes Intravenous On call to O.R. 12/18/13 1858 12/18/13 1900   12/19/13 0600  cefoTEtan (CEFOTAN) 2 g in dextrose 5 % 50 mL IVPB  Status:  Discontinued     2 g 100 mL/hr over 30 Minutes Intravenous 30 min pre-op 12/18/13 1901 12/18/13 1908      Alphonsa Overall, MD, FACS Pager: [facility]  Office: [phone] 12/20/2013

## 2013-12-21 LAB — BASIC METABOLIC PANEL
BUN: 11 mg/dL (ref 6–23)
CHLORIDE: 110 meq/L (ref 96–112)
CO2: 22 meq/L (ref 19–32)
Calcium: 7.7 mg/dL — ABNORMAL LOW (ref 8.4–10.5)
Creatinine, Ser: 0.66 mg/dL (ref 0.50–1.10)
GFR calc Af Amer: >90 mL/min (ref >90)
GFR calc non Af Amer: 80 mL/min — ABNORMAL LOW (ref >90)
GLUCOSE: 177 mg/dL — AB (ref 70–99)
Potassium: 5.6 mEq/L — ABNORMAL HIGH (ref 3.7–5.3)
SODIUM: 142 meq/L (ref 137–147)

## 2013-12-21 LAB — CBC WITH DIFFERENTIAL/PLATELET
BASOS PCT: 0 % (ref 0–1)
Basophils Absolute: 0 10*3/uL (ref 0.0–0.1)
EOS ABS: 0 10*3/uL (ref 0.0–0.7)
EOS PCT: 0 % (ref 0–5)
HEMATOCRIT: 27.6 % — AB (ref 36.0–46.0)
Hemoglobin: 8 g/dL — ABNORMAL LOW (ref 12.0–15.0)
LYMPHS PCT: 7 % — AB (ref 12–46)
Lymphs Abs: 1 10*3/uL (ref 0.7–4.0)
MCH: 21.6 pg — ABNORMAL LOW (ref 26.0–34.0)
MCHC: 29 g/dL — AB (ref 30.0–36.0)
MCV: 74.6 fL — ABNORMAL LOW (ref 78.0–100.0)
Monocytes Absolute: 0.8 10*3/uL (ref 0.1–1.0)
Monocytes Relative: 6 % (ref 3–12)
NEUTROS ABS: 11.8 10*3/uL — AB (ref 1.7–7.7)
Neutrophils Relative %: 87 % — ABNORMAL HIGH (ref 43–77)
Platelets: 250 10*3/uL (ref 150–400)
RBC: 3.7 MIL/uL — AB (ref 3.87–5.11)
RDW: 18.4 % — ABNORMAL HIGH (ref 11.5–15.5)
WBC: 13.6 10*3/uL — ABNORMAL HIGH (ref 4.0–10.5)

## 2013-12-21 MED ORDER — ALPRAZOLAM 0.25 MG PO TABS
0.1250 mg | ORAL_TABLET | Freq: Two times a day (BID) | ORAL | Status: DC | PRN
  Administered 2013-12-21 – 2013-12-24 (×7): 0.125 mg via ORAL
  Filled 2013-12-21 (×7): qty 1

## 2013-12-21 MED ORDER — SODIUM CHLORIDE 0.45 % IV SOLN
INTRAVENOUS | Status: DC
  Administered 2013-12-21 (×3): via INTRAVENOUS
  Administered 2013-12-22: 75 mL via INTRAVENOUS
  Administered 2013-12-22: 08:00:00 via INTRAVENOUS

## 2013-12-21 NOTE — Progress Notes (Signed)
Dr. Lucia Gaskins notified via phone that pt restless and anxious at times. Md aware on xanax @ home. See new orders received.

## 2013-12-21 NOTE — Progress Notes (Signed)
General Surgery Note  LOS: 2 days  POD -  2 Days Post-Op  Assessment/Plan: 1.  LAPAROSCOPIC RIGHT HEMI COLECTOMY - 12/19/2013 - D. [employer]  Doing well.  Will leave on ice chips one more day.  Now on [place]  2.  DVT prophylaxis - SQ Heparin 3.  Anemia - Chronic secondary to GI blood loss  Hgb - 8.0 - 12/21/2013 4.  Diverticulosis  5.  Thyroid replacement  6.  Hypercholesterolemia  7.  Anxiety 8.  Hyperglycemia  Glucose - 177 - 12/21/2013 9.  Hyperkalemia  Will change IVF   Active Problems:   Cancer of ascending colon  Subjective:  Doing well.  Was able to walk in hall yesterday. Objective:   Filed Vitals:   12/21/13 0600  BP: 142/68  Pulse: 50  Temp: 98.3 F (36.8 C)  Resp: 16     Intake/Output from previous day:  01/21 0701 - 01/22 0700 In: 2876 [I.V.:2875] Out: 1325 [Urine:1325]  Intake/Output this shift:      Physical Exam:   General: WN thinner WF who is alert and oriented.    HEENT: Normal. Pupils equal. .   Lungs: Clear.  IS at 900 cc.   Abdomen: Soft.  Rare BS.   Wound: Clean.   Lab Results:     Recent Labs  12/20/13 0340 12/21/13 0600  WBC 9.4 PENDING  HGB 9.1* 8.0*  HCT 29.9* 27.6*  PLT 269 250    BMET    Recent Labs  12/20/13 0340 12/21/13 0600  NA 143 142  K 4.7 5.6*  CL 108 110  CO2 28 22  GLUCOSE 252* 177*  BUN 7 11  CREATININE 0.78 0.66  CALCIUM 8.1* 7.7*    PT/INR  No results found for this basename: LABPROT, INR,  in the last 72 hours  ABG  No results found for this basename: PHART, PCO2, PO2, HCO3,  in the last 72 hours   Studies/Results:  No results found.   Anti-infectives:   Anti-infectives   Start     Dose/Rate Route Frequency Ordered Stop   12/19/13 2000  cefoTEtan (CEFOTAN) 2 g in dextrose 5 % 50 mL IVPB     2 g 100 mL/hr over 30 Minutes Intravenous Every 12 hours 12/19/13 1548 12/19/13 2038   12/19/13 0726  neomycin (MYCIFRADIN) tablet 1,000 mg  Status:  Discontinued     1,000 mg Oral 3 times per day  12/19/13 0726 12/19/13 1543   12/19/13 0726  erythromycin (E-MYCIN) tablet 1,000 mg  Status:  Discontinued     1,000 mg Oral 3 times per day 12/19/13 0726 12/19/13 1543   12/19/13 0726  cefoTEtan (CEFOTAN) 2 g in dextrose 5 % 50 mL IVPB  Status:  Discontinued     2 g 100 mL/hr over 30 Minutes Intravenous 30 min pre-op 12/19/13 0726 12/19/13 1543   12/19/13 0600  cefoTEtan (CEFOTAN) 2 g in dextrose 5 % 50 mL IVPB  Status:  Discontinued     2 g 100 mL/hr over 30 Minutes Intravenous On call to O.R. 12/18/13 1847 12/18/13 1858   12/19/13 0600  cefoTEtan (CEFOTAN) 2 g in dextrose 5 % 50 mL IVPB  Status:  Discontinued     2 g 100 mL/hr over 30 Minutes Intravenous On call to O.R. 12/18/13 1858 12/18/13 1900   12/19/13 0600  cefoTEtan (CEFOTAN) 2 g in dextrose 5 % 50 mL IVPB  Status:  Discontinued     2 g 100 mL/hr over 30  Minutes Intravenous 30 min pre-op 12/18/13 1901 12/18/13 1908      Alphonsa Overall, MD, FACS Pager: [phone] Surgery Office: [phone] 12/21/2013

## 2013-12-22 ENCOUNTER — Encounter (INDEPENDENT_AMBULATORY_CARE_PROVIDER_SITE_OTHER): Payer: Self-pay

## 2013-12-22 LAB — CBC WITH DIFFERENTIAL/PLATELET
BASOS PCT: 0 % (ref 0–1)
Basophils Absolute: 0 10*3/uL (ref 0.0–0.1)
EOS PCT: 0 % (ref 0–5)
Eosinophils Absolute: 0 10*3/uL (ref 0.0–0.7)
HEMATOCRIT: 30 % — AB (ref 36.0–46.0)
HEMOGLOBIN: 8.9 g/dL — AB (ref 12.0–15.0)
Lymphocytes Relative: 15 % (ref 12–46)
Lymphs Abs: 1.6 10*3/uL (ref 0.7–4.0)
MCH: 22.2 pg — ABNORMAL LOW (ref 26.0–34.0)
MCHC: 29.7 g/dL — ABNORMAL LOW (ref 30.0–36.0)
MCV: 74.8 fL — ABNORMAL LOW (ref 78.0–100.0)
MONO ABS: 0.9 10*3/uL (ref 0.1–1.0)
MONOS PCT: 8 % (ref 3–12)
NEUTROS ABS: 8.1 10*3/uL — AB (ref 1.7–7.7)
Neutrophils Relative %: 76 % (ref 43–77)
Platelets: 246 10*3/uL (ref 150–400)
RBC: 4.01 MIL/uL (ref 3.87–5.11)
RDW: 18.8 % — ABNORMAL HIGH (ref 11.5–15.5)
WBC: 10.7 10*3/uL — ABNORMAL HIGH (ref 4.0–10.5)

## 2013-12-22 LAB — BASIC METABOLIC PANEL
BUN: 10 mg/dL (ref 6–23)
CHLORIDE: 107 meq/L (ref 96–112)
CO2: 27 mEq/L (ref 19–32)
CREATININE: 0.79 mg/dL (ref 0.50–1.10)
Calcium: 8 mg/dL — ABNORMAL LOW (ref 8.4–10.5)
GFR calc Af Amer: 87 mL/min — ABNORMAL LOW (ref >90)
GFR calc non Af Amer: 75 mL/min — ABNORMAL LOW (ref >90)
GLUCOSE: 101 mg/dL — AB (ref 70–99)
POTASSIUM: 4.4 meq/L (ref 3.7–5.3)
Sodium: 143 mEq/L (ref 137–147)

## 2013-12-22 MED ORDER — LEVOTHYROXINE SODIUM 88 MCG PO TABS
88.0000 ug | ORAL_TABLET | Freq: Every day | ORAL | Status: DC
  Administered 2013-12-22 – 2013-12-25 (×4): 88 ug via ORAL
  Filled 2013-12-22 (×5): qty 1

## 2013-12-22 MED ORDER — HYDROCODONE-ACETAMINOPHEN 5-325 MG PO TABS: 1.0000 | ORAL_TABLET | ORAL | Status: DC | PRN

## 2013-12-22 NOTE — Progress Notes (Signed)
General Surgery Note  LOS: 3 days  POD -  3 Days Post-Op  Assessment/Plan: 1.  LAPAROSCOPIC RIGHT HEMI COLECTOMY - 12/19/2013 - D. Ketzaly Cardella  Path - Adenocarcinoma of right colon, 0/20 nodes but one deposit in vein  (T3, N1c).  I gave a copy of the path to the patient's husband.  Doing well.  Will increase diet to clear liquids.  2.  DVT prophylaxis - SQ Heparin 3.  Anemia - Chronic secondary to GI blood loss  Hgb - 8.9 - 12/22/2013 4.  Diverticulosis  5.  Thyroid replacement   Restart her synthroid today 6.  Hypercholesterolemia  7.  Anxiety 8.  Hyperkalemia  K+ now 4.4 - 12/22/2013   Active Problems:   Cancer of ascending colon  Subjective:  Doing well.  Had small BM.  I went over path with patient and her husband. Objective:   Filed Vitals:   12/22/13 0616  BP: 126/60  Pulse: 47  Temp: 98.4 F (36.9 C)  Resp: 18     Intake/Output from previous day:  01/22 0701 - 01/23 0700 In: 2658.3 [I.V.:2658.3] Out: 4100 [Urine:4100]  Intake/Output this shift:  Total I/O In: -  Out: 1300 [Urine:1300]   Physical Exam:   General: WN thinner WF who is alert and oriented.    HEENT: Normal. Pupils equal. .   Lungs: Clear.  IS at 900 cc.   Abdomen: Soft.  BS present.   Wound: I took gauze off trocar sites.  Clean in see through dressing.   Lab Results:     Recent Labs  12/21/13 0600 12/22/13 0600  WBC 13.6* 10.7*  HGB 8.0* 8.9*  HCT 27.6* 30.0*  PLT 250 246    BMET    Recent Labs  12/21/13 0600 12/22/13 0600  NA 142 143  K 5.6* 4.4  CL 110 107  CO2 22 27  GLUCOSE 177* 101*  BUN 11 10  CREATININE 0.66 0.79  CALCIUM 7.7* 8.0*    PT/INR  No results found for this basename: LABPROT, INR,  in the last 72 hours  ABG  No results found for this basename: PHART, PCO2, PO2, HCO3,  in the last 72 hours   Studies/Results:  No results found.   Anti-infectives:   Anti-infectives   Start     Dose/Rate Route Frequency Ordered Stop   12/19/13 2000  cefoTEtan  (CEFOTAN) 2 g in dextrose 5 % 50 mL IVPB     2 g 100 mL/hr over 30 Minutes Intravenous Every 12 hours 12/19/13 1548 12/19/13 2038   12/19/13 0726  neomycin (MYCIFRADIN) tablet 1,000 mg  Status:  Discontinued     1,000 mg Oral 3 times per day 12/19/13 0726 12/19/13 1543   12/19/13 0726  erythromycin (E-MYCIN) tablet 1,000 mg  Status:  Discontinued     1,000 mg Oral 3 times per day 12/19/13 0726 12/19/13 1543   12/19/13 0726  cefoTEtan (CEFOTAN) 2 g in dextrose 5 % 50 mL IVPB  Status:  Discontinued     2 g 100 mL/hr over 30 Minutes Intravenous 30 min pre-op 12/19/13 0726 12/19/13 1543   12/19/13 0600  cefoTEtan (CEFOTAN) 2 g in dextrose 5 % 50 mL IVPB  Status:  Discontinued     2 g 100 mL/hr over 30 Minutes Intravenous On call to O.R. 12/18/13 1847 12/18/13 1858   12/19/13 0600  cefoTEtan (CEFOTAN) 2 g in dextrose 5 % 50 mL IVPB  Status:  Discontinued     2 g 100  mL/hr over 30 Minutes Intravenous On call to O.R. 12/18/13 1858 12/18/13 1900   12/19/13 0600  cefoTEtan (CEFOTAN) 2 g in dextrose 5 % 50 mL IVPB  Status:  Discontinued     2 g 100 mL/hr over 30 Minutes Intravenous 30 min pre-op 12/18/13 1901 12/18/13 1908      Alphonsa Overall, MD, FACS Pager: [facility] Office: [phone] 12/22/2013

## 2013-12-23 LAB — TYPE AND SCREEN
ABO/RH(D): O POS
Antibody Screen: NEGATIVE
UNIT DIVISION: 0
UNIT DIVISION: 0
Unit division: 0

## 2013-12-23 NOTE — Progress Notes (Signed)
Patient ID: Anne [name], female   DOB: [birth year], 80 y.o.   MRN: [id number]  [facility] - Progress Note  POD# 4  Subjective: Patient without complaints.  Starting having small BM's and passing flatus.  Taking limited clear liquids.  Objective: Vital signs in last 24 hours: Temp:  [98.1 F (36.7 C)-98.8 F (37.1 C)] 98.4 F (36.9 C) (01/24 0541) Pulse Rate:  [45-50] 45 (01/24 0541) Resp:  [18] 18 (01/24 0541) BP: (133-147)/(50-58) 147/58 mmHg (01/24 0541) SpO2:  [97 %-98 %] 98 % (01/24 0541) Last BM Date: 12/22/13  Intake/Output from previous day: 01/23 0701 - 01/24 0700 In: 2159.2 [I.V.:2159.2] Out: 4275 [Urine:4275]  Exam: HEENT - clear, not icteric Neck - soft Chest - clear bilaterally Cor - RRR, no murmur Abd - soft without distension; BS present; wounds clear and dry Ext - no significant edema Neuro - grossly intact, no focal deficits  Lab Results:   Recent Labs  12/21/13 0600 12/22/13 0600  WBC 13.6* 10.7*  HGB 8.0* 8.9*  HCT 27.6* 30.0*  PLT 250 246     Recent Labs  12/21/13 0600 12/22/13 0600  NA 142 143  K 5.6* 4.4  CL 110 107  CO2 22 27  GLUCOSE 177* 101*  BUN 11 10  CREATININE 0.66 0.79  CALCIUM 7.7* 8.0*    Studies/Results: No results found.  Assessment / Plan: 1.  Status post lap right colon resection  Continue clear liquids today  Encouraged ambulation in halls  Earnstine Regal, MD, [facility] Office: [phone]  12/23/2013

## 2013-12-23 NOTE — Progress Notes (Signed)
Pt IV dated 1/20 and is out of date. Very fearful about having it restarted. She talked to her son on the phone and then refused to let IV team start another IV. She stated that her son who was a MD told her it was unnecessary for her to have another one since the one  in her left arm works fine. Advised pt that if her IV stayed in too long it could cause phlebitis and infection. She still refused to let us restick her.

## 2013-12-24 NOTE — Progress Notes (Signed)
Patient ID: Anne [name], female   DOB: 05/29/1929, 80 y.o.   MRN: [id number]  [facility] - Progress Note  POD# 5  Subjective: Patient in bed.  Ambulated this morning.  Wants regular diet.  Had small formed BM this AM.  Objective: Vital signs in last 24 hours: Temp:  [97.9 F (36.6 C)-98.3 F (36.8 C)] 98.3 F (36.8 C) (01/25 0559) Pulse Rate:  [45-54] 45 (01/25 0559) Resp:  [16-18] 16 (01/25 0559) BP: (134-144)/(48-75) 134/48 mmHg (01/25 0559) SpO2:  [97 %-99 %] 97 % (01/25 0559) Last BM Date: 12/22/13  Intake/Output from previous day: 01/24 0701 - 01/25 0700 In: 2520 [P.O.:720; I.V.:1800] Out: 2400 [Urine:2400]  Exam: HEENT - clear, not icteric Neck - soft Chest - clear bilaterally Cor - RRR, no murmur Abd - soft without distension; BS present; wound clear and dry with intact dressing Ext - no significant edema Neuro - grossly intact, no focal deficits  Lab Results:   Recent Labs  12/22/13 0600  WBC 10.7*  HGB 8.9*  HCT 30.0*  PLT 246     Recent Labs  12/22/13 0600  NA 143  K 4.4  CL 107  CO2 27  GLUCOSE 101*  BUN 10  CREATININE 0.79  CALCIUM 8.0*    Studies/Results: No results found.  Assessment / Plan: 1.  Status post right colectomy  Advance to regular diet  Saline lock IV  PO pain meds  Discontinue SCD's - on heparin SQ and ambulatory  Likely home tomorrow  Earnstine Regal, MD, [facility] Office: [phone]  12/24/2013

## 2013-12-25 MED ORDER — HYDROCODONE-ACETAMINOPHEN 5-325 MG PO TABS: 1.0000 | ORAL_TABLET | Freq: Four times a day (QID) | ORAL | Status: DC | PRN

## 2013-12-25 NOTE — Discharge Instructions (Signed)
[facility] - DISCHARGE INSTRUCTIONS TO PATIENT  Activity:  Driving - May drive in 4 or 5 days, if doing well   Lifting - No lifting more than 15 pounds for 2 weeks, then no limit  Wound Care:   May shower  Diet:  As tolerted  Follow up appointment:  You have an appointment at 8:45AM on Friday, February 13.  Call Dr. Pollie Friar office [facility]) at [phone] for any problems.       Dr. Jacinto Reap. Benay Spice is the medical oncologists.  His phone number is [phone].  His office should call you in the next 72 hours for an appointment in 1 to 3 weeks.  Medications and dosages:  Resume your home medications.  You have a prescription for:  Vicodin.  Call Dr. Lucia Gaskins or his office  [phone]) if you have:  Temperature greater than 100.4,  Persistent nausea and vomiting,  Severe uncontrolled pain,  Redness, tenderness, or signs of infection (pain, swelling, redness, odor or green/yellow discharge around the site),  Difficulty breathing, headache or visual disturbances,  Any other questions or concerns you may have after discharge.  In an emergency, call 911 or go to an Emergency Department at a nearby hospital.

## 2013-12-25 NOTE — Progress Notes (Signed)
Pt discharged home via family; Pt and family given and explained all discharge instructions, carenotes, and prescriptions; pt and family stated understanding and denied questions/concerns; all f/u appointments in place; IV removed without complicaitons; pt stable at time of discharge   Removed pts staples and applied steri strips; pt explained and verbalized understanding of incision care instructions; pt seems anxious, but states understanding

## 2013-12-25 NOTE — Care Management Note (Signed)
    Page 1 of 2   12/25/2013     10:59:44 AM   CARE MANAGEMENT NOTE 12/25/2013  Patient:  Anne [name], Anne [name]   Account Number:  [id number]  Date Initiated:  12/20/2013  Documentation initiated by:  DAVIS,RHONDA  Subjective/Objective Assessment:   80 yo f with diverticlitis and requiring colostomy, in sdu postop due to bradycardia and wound care.  transferred to med surg on [id number].     Action/Plan:   home when stable/hx of colon ca of the ascending.  will follow for hhc needs.   Anticipated DC Date:  12/25/2013   Anticipated DC Plan:  HOME/SELF CARE  In-house referral  NA      DC Planning Services  CM consult      PAC Choice  NA   Choice offered to / List presented to:  NA   DME arranged  NA      DME agency  NA     [place] arranged  NA      [place] agency  NA   Status of service:  Completed, signed off Medicare Important Message given?  NA - LOS <3 / Initial given by admissions (If response is "NO", the following Medicare IM given date fields will be blank) Date Medicare IM given:   Date Additional Medicare IM given:    Discharge Disposition:  HOME/SELF CARE  Per UR Regulation:  Reviewed for med. necessity/level of care/duration of stay  If discussed at [place] of Stay Meetings, dates discussed:    Comments:  [id number]/Rhonda Eldridge Dace, BSN, [place] [id number] Chart Reviewed for discharge and hospital needs. Discharge needs at time of review:  None present will follow for needs. Review of patient progress due on [id number].

## 2013-12-25 NOTE — Discharge Summary (Addendum)
Physician Discharge Summary  Patient ID:  Anne [name]  MRN: [id number]  DOB/AGE: 1929-10-14 80 y.o.  Admit date: 12/19/2013 Discharge date: 12/25/2013  Discharge Diagnoses:  1.  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  (Accession#: [id number]) 2. Anxiety 3. 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 Diverticulosis  5. Thyroid replacement  6. Hypercholesterolemia    Active Problems:   Cancer of ascending colon  Operation: Procedure(s): LAPAROSCOPIC RIGHT HEMI COLECTOMY on 12/19/2013 - D. Lucia Gaskins  Discharged Condition: good  Hospital Course: Anne [name] is an 80 y.o. female whose primary care physician is Thressa Sheller, MD and who was admitted 12/19/2013 with a chief complaint of right colon cancer.  She had a colonoscopy by Dr. Verdia Kuba on 12/13/2013 which revealed an ulcerated circumferential mass in the mid ascending colon.  Biopsies revealed adenocarcinoma.  Her CT scan showed no evidence of metastatic disease, but suggested "findings suspicious for transmural spread and localized pericolonic nodal metastasis".  She was brought to the operating room on 12/19/2013 and underwent  LAPAROSCOPIC RIGHT HEMI COLECTOMY.   Postoperatively, she was kept in step down for one night.  She was transferred out on the first post op day.  By the third post op day, she had passed flatus and her diet was started.  She is now 6 days post op.  She is eating, having bowel movements, her pain is controlled, and she is ready to go home.  The discharge instructions were reviewed with the patient.  Consults: None  Significant Diagnostic Studies: Results for orders placed during the hospital encounter of 12/19/13  CBC      Result Value Range   WBC 8.5  4.0 - 10.5 K/uL   RBC 4.18  3.87 - 5.11 MIL/uL   Hemoglobin 9.4 (*) 12.0 - 15.0 g/dL   HCT 30.9 (*) 36.0 - 46.0 %    MCV 73.9 (*) 78.0 - 100.0 fL   MCH 22.5 (*) 26.0 - 34.0 pg   MCHC 30.4  30.0 - 36.0 g/dL   RDW 18.1 (*) 11.5 - 15.5 %   Platelets 248  150 - 400 K/uL  BASIC METABOLIC PANEL      Result Value Range   Sodium 143  137 - 147 mEq/L   Potassium 4.7  3.7 - 5.3 mEq/L   Chloride 108  96 - 112 mEq/L   CO2 28  19 - 32 mEq/L   Glucose, Bld 252 (*) 70 - 99 mg/dL   BUN 7  6 - 23 mg/dL   Creatinine, Ser 0.78  0.50 - 1.10 mg/dL   Calcium 8.1 (*) 8.4 - 10.5 mg/dL   GFR calc non Af Amer 76 (*) >90 mL/min   GFR calc Af Amer 88 (*) >90 mL/min  CBC      Result Value Range   WBC 9.4  4.0 - 10.5 K/uL   RBC 4.06  3.87 - 5.11 MIL/uL   Hemoglobin 9.1 (*) 12.0 - 15.0 g/dL   HCT 29.9 (*) 36.0 - 46.0 %   MCV 73.6 (*) 78.0 - 100.0 fL   MCH 22.4 (*) 26.0 - 34.0 pg   MCHC 30.4  30.0 - 36.0 g/dL   RDW 17.8 (*) 11.5 - 15.5 %   Platelets 269  150 - 400 K/uL  CBC WITH DIFFERENTIAL      Result Value Range  WBC 13.6 (*) 4.0 - 10.5 K/uL   RBC 3.70 (*) 3.87 - 5.11 MIL/uL   Hemoglobin 8.0 (*) 12.0 - 15.0 g/dL   HCT 27.6 (*) 36.0 - 46.0 %   MCV 74.6 (*) 78.0 - 100.0 fL   MCH 21.6 (*) 26.0 - 34.0 pg   MCHC 29.0 (*) 30.0 - 36.0 g/dL   RDW 18.4 (*) 11.5 - 15.5 %   Platelets 250  150 - 400 K/uL   Neutrophils Relative % 87 (*) 43 - 77 %   Lymphocytes Relative 7 (*) 12 - 46 %   Monocytes Relative 6  3 - 12 %   Eosinophils Relative 0  0 - 5 %   Basophils Relative 0  0 - 1 %   Neutro Abs 11.8 (*) 1.7 - 7.7 K/uL   Lymphs Abs 1.0  0.7 - 4.0 K/uL   Monocytes Absolute 0.8  0.1 - 1.0 K/uL   Eosinophils Absolute 0.0  0.0 - 0.7 K/uL   Basophils Absolute 0.0  0.0 - 0.1 K/uL   RBC Morphology POLYCHROMASIA PRESENT    BASIC METABOLIC PANEL      Result Value Range   Sodium 142  137 - 147 mEq/L   Potassium 5.6 (*) 3.7 - 5.3 mEq/L   Chloride 110  96 - 112 mEq/L   CO2 22  19 - 32 mEq/L   Glucose, Bld 177 (*) 70 - 99 mg/dL   BUN 11  6 - 23 mg/dL   Creatinine, Ser 0.66  0.50 - 1.10 mg/dL   Calcium 7.7 (*) 8.4 - 10.5  mg/dL   GFR calc non Af Amer 80 (*) >90 mL/min   GFR calc Af Amer >90  >90 mL/min  BASIC METABOLIC PANEL      Result Value Range   Sodium 143  137 - 147 mEq/L   Potassium 4.4  3.7 - 5.3 mEq/L   Chloride 107  96 - 112 mEq/L   CO2 27  19 - 32 mEq/L   Glucose, Bld 101 (*) 70 - 99 mg/dL   BUN 10  6 - 23 mg/dL   Creatinine, Ser 0.79  0.50 - 1.10 mg/dL   Calcium 8.0 (*) 8.4 - 10.5 mg/dL   GFR calc non Af Amer 75 (*) >90 mL/min   GFR calc Af Amer 87 (*) >90 mL/min  CBC WITH DIFFERENTIAL      Result Value Range   WBC 10.7 (*) 4.0 - 10.5 K/uL   RBC 4.01  3.87 - 5.11 MIL/uL   Hemoglobin 8.9 (*) 12.0 - 15.0 g/dL   HCT 30.0 (*) 36.0 - 46.0 %   MCV 74.8 (*) 78.0 - 100.0 fL   MCH 22.2 (*) 26.0 - 34.0 pg   MCHC 29.7 (*) 30.0 - 36.0 g/dL   RDW 18.8 (*) 11.5 - 15.5 %   Platelets 246  150 - 400 K/uL   Neutrophils Relative % 76  43 - 77 %   Neutro Abs 8.1 (*) 1.7 - 7.7 K/uL   Lymphocytes Relative 15  12 - 46 %   Lymphs Abs 1.6  0.7 - 4.0 K/uL   Monocytes Relative 8  3 - 12 %   Monocytes Absolute 0.9  0.1 - 1.0 K/uL   Eosinophils Relative 0  0 - 5 %   Eosinophils Absolute 0.0  0.0 - 0.7 K/uL   Basophils Relative 0  0 - 1 %   Basophils Absolute 0.0  0.0 - 0.1 K/uL  PREPARE RBC (  CROSSMATCH)      Result Value Range   Order Confirmation ORDER PROCESSED BY BLOOD BANK    PREPARE RBC (CROSSMATCH)      Result Value Range   Order Confirmation ORDER PROCESSED BY BLOOD BANK     Ct Abdomen Pelvis W Contrast  12/13/2013   CLINICAL DATA:  Left-sided colonic mass on colonoscopy. Anemia. Lower abdominal pain. Nausea. Constipation. weight loss. Left mid chest pain.  EXAM: CT CHEST, ABDOMEN, AND PELVIS WITH CONTRAST  TECHNIQUE: Multidetector CT imaging of the chest, abdomen and pelvis was performed following the standard protocol during bolus administration of intravenous contrast.  CONTRAST:  79mL OMNIPAQUE IOHEXOL 300 MG/ML SOLN; 146mL OMNIPAQUE IOHEXOL 300 MG/ML SOLN  COMPARISON:  DG ABDOMEN 2V dated  05/02/2012; CT ABD/PELVIS W CM dated 09/30/2011; DG CHEST 2V dated 04/20/2012  FINDINGS: CT CHEST FINDINGS  Lungs/Pleura: 3 mm right upper lobe lung nodule on image 27/series 4.  Minimal nodularity along the left hemidiaphragm, including at 4 mm on image 45/series 4.  Probable calcified left lower lobe 2 mm nodule on image 28/series 4.  No pleural fluid.  Heart/Mediastinum: No supraclavicular adenopathy. Aortic and branch vessel atherosclerosis. Cardiomegaly, accentuated by a mild pectus excavatum deformity. No pericardial effusion. No central pulmonary embolism, on this non-dedicated study. No mediastinal or hilar adenopathy.  CT ABDOMEN AND PELVIS FINDINGS  Abdomen/Pelvis: Normal liver, spleen, stomach, adrenal glands. Partial fatty atrophy in the pancreatic head and uncinate process. Cholecystectomy without biliary ductal dilatation. Normal adrenal glands and kidneys.  Aortic and branch vessel atherosclerosis. No retroperitoneal or retrocrural adenopathy. The ascending colonic primary is identified on image 80/series 3. Centered just cephalad to the ileocecal valve. Surrounding small pericolonic and ileocolic mesenteric nodes which are suspicious based on location. Index 6 mm node on image 84. Edema in the pericolonic fat is suspicious for transmural disease. No obstruction.  Normal small bowel without abdominal ascites. No evidence of omental or peritoneal disease.  No pelvic adenopathy. Normal urinary bladder. Hysterectomy. No adnexal mass. No significant free fluid.  Bones/Musculoskeletal: No acute osseous abnormality.  IMPRESSION: 1. Ascending colonic primary with findings suspicious for transmural spread and localized pericolonic nodal metastasis. 2. No evidence of distant metastasis within the chest, abdomen, or pelvis. 3. Tiny nonspecific scattered pulmonary nodules. These can be re-evaluated at followup.   Electronically Signed   By: Abigail Miyamoto M.D.   On: 12/13/2013 14:45    Discharge Exam:  Filed  Vitals:   12/25/13 0608  BP: 131/61  Pulse: 47  Temp: 97.9 F (36.6 C)  Resp: 16    General: WN WF who is alert.  Lungs: Clear to auscultation and symmetric breath sounds. Heart:  RRR. No murmur or rub. Abdomen: Soft. Normal bowel sounds.  Wounds looks good.  Will remove staples and steristrip wound.  Discharge Medications:     Medication List         ALPRAZolam 0.25 MG tablet  Commonly known as:  XANAX  Take 0.125 mg by mouth 2 (two) times daily.     HYDROcodone-acetaminophen 5-325 MG per tablet  Commonly known as:  NORCO/VICODIN  Take 1-2 tablets by mouth every 6 (six) hours as needed for moderate pain.     multivitamin with minerals Tabs tablet  Take 1 tablet by mouth daily.     omeprazole 20 MG capsule  Commonly known as:  PRILOSEC  Take 20 mg by mouth daily.     polyethylene glycol packet  Commonly known as:  MIRALAX / GLYCOLAX  Take 17 g by mouth as needed.     pseudoephedrine 30 MG tablet  Commonly known as:  SUDAFED  Take 30 mg by mouth every 4 (four) hours as needed for congestion.     simvastatin 20 MG tablet  Commonly known as:  ZOCOR  Take 20 mg by mouth every evening.     SYNTHROID 88 MCG tablet  Generic drug:  levothyroxine  Take 88 mcg by mouth daily before breakfast.     Vitamin D3 1000 UNITS Caps  Take 1,000 Units by mouth daily.        Disposition: 01-Home or Self Care      Discharge Orders   Future Orders Complete By Expires   Diet - low sodium heart healthy  As directed    Increase activity slowly  As directed      Activity:  Driving - May drive in 4 or 5 days, if doing well   Lifting - No lifting more than 15 pounds for 2 weeks, then no limit  Wound Care:   May shower  Diet:  As tolerted  Follow up appointment:  You have an appointment at 8:45AM on Friday, February 13.  Call Dr. Pollie Friar office [facility]) at [phone] for any problems.       Dr. Jacinto Reap. Benay Spice is the medical oncologists.  His phone number is  [phone].  His office should call you in the next 72 hours for an appointment in 1 to 3 weeks.  Medications and dosages:  Resume your home medications.  You have a prescription for:  Vicodin.  Signed: Alphonsa Overall, M.D., [facility] Office:  [phone]  12/25/2013, 10:17 AM

## 2013-12-26 ENCOUNTER — Telehealth: Payer: Self-pay | Admitting: *Deleted

## 2013-12-26 NOTE — Telephone Encounter (Signed)
Spoke with patient's husband and confirmed appointment with Dr. Benay Spice for 01/05/14.  Contact names, numbers, and directions were provided.

## 2014-01-05 ENCOUNTER — Ambulatory Visit (HOSPITAL_BASED_OUTPATIENT_CLINIC_OR_DEPARTMENT_OTHER): Payer: Medicare Other

## 2014-01-05 ENCOUNTER — Ambulatory Visit (HOSPITAL_BASED_OUTPATIENT_CLINIC_OR_DEPARTMENT_OTHER): Payer: Medicare Other | Admitting: Oncology

## 2014-01-05 ENCOUNTER — Encounter: Payer: Self-pay | Admitting: Oncology

## 2014-01-05 ENCOUNTER — Telehealth: Payer: Self-pay | Admitting: Oncology

## 2014-01-05 VITALS — BP 100/53 | HR 56 | Temp 97.8°F | Resp 18 | Ht 64.0 in | Wt 134.3 lb

## 2014-01-05 DIAGNOSIS — F3289 Other specified depressive episodes: Secondary | ICD-10-CM

## 2014-01-05 DIAGNOSIS — C182 Malignant neoplasm of ascending colon: Secondary | ICD-10-CM

## 2014-01-05 DIAGNOSIS — F411 Generalized anxiety disorder: Secondary | ICD-10-CM

## 2014-01-05 DIAGNOSIS — F329 Major depressive disorder, single episode, unspecified: Secondary | ICD-10-CM

## 2014-01-05 DIAGNOSIS — E119 Type 2 diabetes mellitus without complications: Secondary | ICD-10-CM

## 2014-01-05 DIAGNOSIS — D509 Iron deficiency anemia, unspecified: Secondary | ICD-10-CM

## 2014-01-05 DIAGNOSIS — C189 Malignant neoplasm of colon, unspecified: Secondary | ICD-10-CM

## 2014-01-05 NOTE — Progress Notes (Signed)
Anne [name] Consult   Referring MD: Anne [name] 80 y.o.  Mar 02, 1929    Reason for Referral: Colon cancer     HPI: She reports not feeling well for the past several years. She complains of malaise and sole Dr. Noah Delaine. A CBC on 11/14/2013 revealed a microcytic anemia. She was referred to Dr. Collene Mares and was taken to an upper endoscopy and colonoscopy procedure on 12/13/2013 (we do not have the procedure report available today). A mass was found in the right colon. A biopsy confirmed adenocarcinoma. A biopsy from the stomach H. pylori gastritis.  CTs of the chest abdomen and pelvis on 12/13/2013 revealed tiny lung nodules. Normal liver. A primary colon tumor was identified cephalad to the ileocecal valve. There were surrounding small pericolonic an ileocolic mesenteric nodes. Edema was noted in the pericolonic fat. No obstruction. No evidence of omental peritoneal disease.  She was referred to Dr. Lucia Gaskins and was taken to the operating room on 12/19/2013 for a laparoscopic-assisted right hemicolectomy. She was transfused one unit of red blood cells at the beginning of surgery. The liver appeared unremarkable. A right colectomy was performed. No gross evidence of remaining disease. One large lymph node was noted in the specimen.  The pathology ([id number]) revealed invasive adenocarcinoma extending into the pericolonic connective tissue. The surgical margins were negative. 20 benign lymph nodes. There was one tumor deposit in the mesenteric adipose tissue. The tumor measured 8.5 cm and was a grade 3 lesion. Lymphovascular invasion was present. No perineural invasion. No macroscopic tumor perforation. There was a Crohn's like response in the tumor.    Past Medical History  Diagnosis Date  .  sigmoid colon polyp   12/13/2013   .  hyperlipidemia    .  vitamin D deficiency    . Abdominal distension   . Hypothyroidism   . GERD (gastroesophageal  reflux disease)   . Chronic kidney disease     hx uti 1 mo ago was med tx   . Depression   . Anxiety     facial twitch and click , twitch lt shoulder  due to anxiety  .  type 2 diabetes mellitus        . Arthritis     right first finger  .  G3 P3         .   H. pylori gastritis                                                                                             12/13/2013  Past Surgical History  Procedure Laterality Date  . Bladder tack      [name] does not remember date  . Vericose vein removal      legs. does not remember date of procedure  . Appendectomy  1948  . Nose surgery      1970  . Cholecystectomy  12/30/2011    Procedure: LAPAROSCOPIC CHOLECYSTECTOMY WITH INTRAOPERATIVE CHOLANGIOGRAM;  Surgeon: Stark Klein, MD;  Location: [facility];  Service: General;  Laterality: N/A;  . Abdominal hysterectomy   age 84      .  Colonscopy   12/13/2013   . Laparoscopic right hemi colectomy Right 12/19/2013    Procedure: LAPAROSCOPIC RIGHT HEMI COLECTOMY;  Surgeon: Shann Medal, MD;  Location: [facility];  Service: General;  Laterality: Right;    Family History  Problem Relation Age of Onset  . Stroke Mother   . Cancer Sister     breast  . Cancer Brother     lung and thyroid    .   Esophagus cancer                          Brother   .   "Cancer "                                         maternal grandmother  6 siblings, no other family history of cancer  Current outpatient prescriptions:ALPRAZolam (XANAX) 0.25 MG tablet, Take 0.125 mg by mouth 2 (two) times daily. , Disp: , Rfl: ;  Cholecalciferol (VITAMIN D3) 1000 UNITS CAPS, Take 1,000 Units by mouth daily. , Disp: , Rfl: ;  HYDROcodone-acetaminophen (NORCO/VICODIN) 5-325 MG per tablet, Take 1-2 tablets by mouth every 6 (six) hours as needed for moderate pain., Disp: 30 tablet, Rfl: 0 Multiple Vitamin (MULTIVITAMIN WITH MINERALS) TABS tablet, Take 1 tablet by mouth daily., Disp: , Rfl: ;  simvastatin (ZOCOR) 20 MG tablet, Take  20 mg by mouth every evening., Disp: , Rfl: ;  SYNTHROID 88 MCG tablet, Take 88 mcg by mouth daily before breakfast. , Disp: , Rfl:   Allergies: No Known Allergies  Social History: She lives with her husband and [place]. She is a Agricultural engineer. No tobacco or alcohol use. No transfusion history prior to 12/19/2013. No risk factors for HIV or hepatitis.   ROS:   Positives include: confusion when she was treated with antidepressants, hot flashes after stopping antidepressants, chronic constipation, urinary tract infection a few months ago, pruritus for the past 2 years-now better, anxiety   A complete ROS was otherwise negative.  Physical Exam:  Blood pressure 100/53, pulse 56, temperature 97.8 F (36.6 C), temperature source Oral, resp. rate 18, height 5' 4"  (1.626 m), weight 134 lb 4.8 oz (60.918 kg), SpO2 100.00%.  HEENT:  oropharynx without visible mass, neck without mass Lungs:  clear bilaterally  Cardiac:  regular rate and rhythm  Abdomen:  No hepatosplenomegaly, no mass, midline surgical incision with Steri-Strips in place  Vascular:  no leg edema  Lymph nodes:  no cervical, supraclavicular, axillary, or inguinal nodes  Neurologic:  alert and oriented, the motor exam appears intact in the upper and lower extremities Skin:  multiple benign appearing moles over the trunk, no rash  Musculoskeletal:  no spine tenderness   LAB:  CBC  Lab Results  Component Value Date   WBC 10.7* 12/22/2013   HGB 8.9* 12/22/2013   HCT 30.0* 12/22/2013   MCV 74.8* 12/22/2013   PLT 246 12/22/2013   NEUTROABS 8.1* 12/22/2013     CMP      Component Value Date/Time   NA 143 12/22/2013 0600   K 4.4 12/22/2013 0600   CL 107 12/22/2013 0600   CO2 27 12/22/2013 0600   GLUCOSE 101* 12/22/2013 0600   BUN 10 12/22/2013 0600   CREATININE 0.79 12/22/2013 0600   CALCIUM 8.0* 12/22/2013 0600   PROT 6.5 12/15/2013 1130   ALBUMIN 2.7* 12/15/2013 1130  AST 15 12/15/2013 1130   ALT 11 12/15/2013 1130   ALKPHOS 66  12/15/2013 1130   BILITOT 0.2* 12/15/2013 1130   GFRNONAA 75* 12/22/2013 0600   GFRAA 87* 12/22/2013 0600    CEA on 12/15/2013-1.1   Radiology: as per history of present illness     Assessment/Plan:   1  stage IIIB (T3, N1c) adenocarcinoma of the ascending colon, grade 3, status post a laparoscopic assisted right colectomy 12/19/2013  20 benign lymph nodes, one soft tissue venous tumor deposit , Crohn's like response  2. sigmoid colon polyp 12/13/2013   3. Tiny nonspecific pulmonary nodules on a chest CT 12/13/2013  4.  Microcytic anemia-likely iron deficiency  5.  History of anxiety/depression   6. status post cholecystectomy 12/30/2011  7.  type 2 diabetes   Disposition:    She has been diagnosed with stage III colon cancer. I discussed the diagnosis, prognosis, and adjuvant treatment options with Ms.  Anne [name] and her family. We discussed the details of the surgical pathology report.   She may have undergone curative surgery, but she has a significant chance of developing recurrent disease over the next 5 years. This is based on the lymphovascular invasion, histologic grade, and intravenous tumor deposit. I predict her prognosis is better than the average stage III [name], but worse than most stage II patients.  We discussed the expected decrease in the relapse rate with adjuvant 5-fluorouracil-based therapy.  I discussed the potential toxicities associated with capecitabine. She understands the chance for nausea, mucositis, diarrhea, and hematologic toxicity. We discussed the rash, hyperpigmentation, and hand/foot syndrome seen with capecitabine.   The [name] and her family were given reading materials on capecitabine. She will return for an office visit and further discussion next week. She will consider her age and comorbid conditions which she decides on adjuvant therapy. I recommended she begin ferrous sulfate for treatment of the microcytic anemia. We will check the  hemoglobin when she returns next week.  The tumor will be submitted for microsatellite instability and mismatch repair protein testing. Her family understands that regardless of these results they should undergo screening for colon cancer.    [place], [place] 01/05/2014, 5:28 PM

## 2014-01-05 NOTE — Telephone Encounter (Signed)
gv adn printed appt sched anda vs for pt for Feb °

## 2014-01-10 ENCOUNTER — Other Ambulatory Visit (HOSPITAL_BASED_OUTPATIENT_CLINIC_OR_DEPARTMENT_OTHER): Payer: Medicare Other

## 2014-01-10 ENCOUNTER — Telehealth: Payer: Self-pay | Admitting: Oncology

## 2014-01-10 ENCOUNTER — Other Ambulatory Visit (HOSPITAL_COMMUNITY)
Admission: RE | Admit: 2014-01-10 | Discharge: 2014-01-10 | Disposition: A | Discharge status: Home / Self Care | Payer: Medicare Other | Source: Ambulatory Visit | Attending: Oncology | Admitting: Oncology

## 2014-01-10 ENCOUNTER — Ambulatory Visit (HOSPITAL_BASED_OUTPATIENT_CLINIC_OR_DEPARTMENT_OTHER): Payer: Medicare Other | Admitting: Oncology

## 2014-01-10 VITALS — BP 111/61 | HR 66 | Temp 97.6°F | Resp 18 | Ht 64.0 in | Wt 131.9 lb

## 2014-01-10 DIAGNOSIS — C189 Malignant neoplasm of colon, unspecified: Secondary | ICD-10-CM | POA: Insufficient documentation

## 2014-01-10 DIAGNOSIS — D649 Anemia, unspecified: Secondary | ICD-10-CM

## 2014-01-10 LAB — CBC WITH DIFFERENTIAL/PLATELET
BASO%: 0.7 % (ref 0.0–2.0)
Basophils Absolute: 0 10*3/uL (ref 0.0–0.1)
EOS%: 2.3 % (ref 0.0–7.0)
Eosinophils Absolute: 0.1 10*3/uL (ref 0.0–0.5)
HEMATOCRIT: 32.8 % — AB (ref 34.8–46.6)
HEMOGLOBIN: 10.2 g/dL — AB (ref 11.6–15.9)
LYMPH#: 1.4 10*3/uL (ref 0.9–3.3)
LYMPH%: 22.2 % (ref 14.0–49.7)
MCH: 22.7 pg — AB (ref 25.1–34.0)
MCHC: 31 g/dL — AB (ref 31.5–36.0)
MCV: 73.4 fL — AB (ref 79.5–101.0)
MONO#: 0.5 10*3/uL (ref 0.1–0.9)
MONO%: 8 % (ref 0.0–14.0)
NEUT#: 4.1 10*3/uL (ref 1.5–6.5)
NEUT%: 66.8 % (ref 38.4–76.8)
Platelets: 267 10*3/uL (ref 145–400)
RBC: 4.48 10*6/uL (ref 3.70–5.45)
RDW: 23.1 % — AB (ref 11.2–14.5)
WBC: 6.2 10*3/uL (ref 3.9–10.3)

## 2014-01-10 NOTE — Progress Notes (Signed)
Very concerned about taking Xeloda due to being resistant to taking pills. Says "I only want to take one a day". Asks RN "what does the chemo pill taste like?". Family strongly encouraging her to proceed with Xeloda.

## 2014-01-10 NOTE — Telephone Encounter (Signed)
gv and printed appt sched and avs for pt for June... °

## 2014-01-10 NOTE — Progress Notes (Signed)
Met with Anne [name] and her husband. Explained role of nurse navigator. Educational information provided on colon cancer  [facility] resources provided to patient, including SW, support groups,  and dietician information.  Patient declines any referrals at this time as she stated she is not going to pursue treatment.  This RN explained that all resources are available to her despite her treatment choice.  She expressed appreciation but stated she did not need services at this time.  Contact names and phone numbers were provided for entire [facility] team.  Teach back method was used.  No barriers to care identified.

## 2014-01-10 NOTE — Progress Notes (Addendum)
   [place]    OFFICE PROGRESS NOTE   INTERVAL HISTORY:   She returns for scheduled followup of colon cancer. When she was here last week we discussed the option of adjuvant capecitabine. She has discussed this with her family members. She reports family members have encouraged her to proceed with systemic therapy, but she is against this. She is a concerned about difficulty taking "pills "and associated toxicities.  She is taking iron. She reports constipation.  Objective:  Vital signs in last 24 hours:  Blood pressure 111/61, pulse 66, temperature 97.6 F (36.4 C), temperature source Oral, resp. rate 18, height $RemoveBe'5\' 4"'ozMTwKXTz$  (1.626 m), weight 131 lb 14.4 oz (59.829 kg).     GI: the abdominal incision appears healed with a few Steri-Strips remaining. Vascular: no leg edema   Lab Results:  Lab Results  Component Value Date   WBC 6.2 01/10/2014   HGB 10.2* 01/10/2014   HCT 32.8* 01/10/2014   MCV 73.4* 01/10/2014   PLT 267 01/10/2014   NEUTROABS 4.1 01/10/2014      Medications: I have reviewed the patient's current medications.  Assessment/Plan: 1 stage IIIB (T3, N1c) adenocarcinoma of the ascending colon, grade 3, status post a laparoscopic assisted right colectomy 12/19/2013  20 benign lymph nodes, one soft tissue venous tumor deposit , Crohn's like response Immunohistochemical testing confirmed loss of an MLH1 and PMS2 expression 2. sigmoid colon polyp 12/13/2013  3. Tiny nonspecific pulmonary nodules on a chest CT 12/13/2013  4. Microcytic anemia-likely iron deficiency, improved  5. History of anxiety/depression  6. status post cholecystectomy 12/30/2011  7. type 2 diabetes    Disposition:  We had further discussion regarding the risk/benefit of adjuvant capecitabine.She decided against adjuvant therapy. She will return for an office visit in 4 months.  The loss of MLH1 and PMS2 expression is likely related to promoter hypermethylation.BRAF testing has  been submitted. We will followup on this result and contact Ms. Pickrel.  She will continue iron and ask Dr. Noah Delaine to check a followup hemoglobin.  She asked me to contact her son to discuss her case.   Betsy Coder, MD  01/10/2014  3:40 PM  01/23/2014 The colon tumor returned with a BRAF V600E mutation. She therefore does not have hereditary non-polyposis colon cancer.  I discussed the case with her son, Dr. Lyndle Herrlich by telephone. We discussed the chance for developing recurrent colon cancer and the risk/benefit of adjuvant chemotherapy. He is comfortable with her decision against adjuvant therapy. He is aware that her family members should be screened for colon cancer despite the negative HNPCC testing.

## 2014-01-12 ENCOUNTER — Ambulatory Visit (INDEPENDENT_AMBULATORY_CARE_PROVIDER_SITE_OTHER): Payer: Medicare Other | Admitting: Surgery

## 2014-01-12 ENCOUNTER — Encounter (INDEPENDENT_AMBULATORY_CARE_PROVIDER_SITE_OTHER): Payer: Self-pay | Admitting: Surgery

## 2014-01-12 VITALS — BP 118/70 | HR 80 | Temp 98.0°F | Resp 18 | Ht 64.0 in | Wt 131.4 lb

## 2014-01-12 DIAGNOSIS — C189 Malignant neoplasm of colon, unspecified: Secondary | ICD-10-CM

## 2014-01-12 HISTORY — PX: OTHER SURGICAL HISTORY: SHX169

## 2014-01-12 NOTE — Progress Notes (Signed)
Re:   [name] DOB:   Oct 16, 1929 MRN:   [id number]  ASSESSMENT AND PLAN: 1.  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  Right hemicolecotmy - 12/20/2013 - D. Aneya Daddona  Has seen Dr. Jacinto Reap. [place].  Decided against chemotx.  She'll see me back in 6 months.  Plan repeat colonoscopy at 1 year.  2.  Diverticulosis 3.  Thyroid replacement 4.  Hypercholesterolemia 5.  Anxiety  No chief complaint on file.  REFERRING PHYSICIAN: Thressa Sheller, MD/ Meriel Pica, MD  HISTORY OF PRESENT ILLNESS: Anne [name] is a 80 y.o. (DOB: 1929/05/06)  white  female whose primary care physician is [facility], MD and comes to me today for follow up of a right colon cancer. Comes with husband.  She says that she is weak and has lost weight.  But for being only 3 weeks out from surgery, at her age, she looks good. We talked about the chemotx - Xeloda.  She has decided not to take it.  Her calculated recurrence is probably 30-40%.  The Xeloda may provide a 30 to 50% reduction in recurrence, for an absolute benefit of 10 to 20%.  At her age, I think her decision is reasonable.  History of colon cancer: She had some vague abdominal pain a couple of years ago, which was diagnosed as gall bladder disease.  She saw Dr. Barry Dienes at that time and had a lap chole 12/2011.  Over the last 6 months, she says that she has lost 6 lbs.  But she has very non specific abdominal complaints.  Dr. Alyson Ingles followed her Hgb and it dropped from 11.8 to 9.3.  This prompted the colonoscopy.  According to her husband, Dr. Alyson Ingles has been encouraging her to have a colonoscopy for some time, but the patient has refused due to anxiety.  She had a colonoscopy by Dr. Verdia Kuba on 12/13/2013 which revealed an ulcerated circumferential mass in the mid ascending colon. CT scan - 12/13/2013 - 1. Ascending colonic primary with findings suspicious for transmural spread and localized pericolonic nodal  metastasis. 2. No evidence of distant metastasis within the chest, abdomen, or pelvis. 3. Tiny nonspecific scattered pulmonary nodules. These can be re-evaluated at followup.   Past Medical History  Diagnosis Date  . Thyroid disease   . Constipation   . Abdominal pain   . Abdominal distension   . Hypothyroidism   . GERD (gastroesophageal reflux disease)   . Chronic kidney disease     hx uti 1 mo ago was med tx   . Depression   . Anxiety     facial twitch and click , twitch lt shoulder  due to anxiety  . No pertinent past medical history     OCC PAINS TO ABDOMEN AND CHEST , DR BYERLY PLACED PATIENT ON MED FOR GERD FOR 2 WEEKS  . Arthritis     right first finger  . Itching     at times, area varies     Current Outpatient Prescriptions  Medication Sig Dispense Refill  . ALPRAZolam (XANAX) 0.25 MG tablet Take 0.125 mg by mouth 2 (two) times daily.       . Bisacodyl (DULCOLAX PO) Take 2 capsules by mouth at bedtime.      . Cholecalciferol (VITAMIN D3) 1000 UNITS CAPS Take 1,000 Units by mouth daily.       . ferrous sulfate 325 (65 FE) MG tablet Take 325 mg by mouth 2 (  two) times daily with a meal.      . HYDROcodone-acetaminophen (NORCO/VICODIN) 5-325 MG per tablet Take 1-2 tablets by mouth every 6 (six) hours as needed for moderate pain.  30 tablet  0  . Multiple Vitamin (MULTIVITAMIN WITH MINERALS) TABS tablet Take 1 tablet by mouth daily.      . simvastatin (ZOCOR) 20 MG tablet Take 20 mg by mouth every evening.      Marland Kitchen SYNTHROID 88 MCG tablet Take 88 mcg by mouth daily before breakfast.        No current facility-administered medications for this visit.     No Known Allergies  REVIEW OF SYSTEMS: Endocrine:  No diabetes. On thyroid replacement. Gastrointestinal:  No history of stomach disease.  No history of liver disease.  Cholecystectomy - 12/30/2011 - F. Byerly.  No history of pancreas disease.  Appendectomy - 1965.Marland Kitchen GYN:  Hysterectomy - 1987 - R. Neal. Urologic:  No history  of kidney stones.  No history of bladder infections. Musculoskeletal:  No history of joint or back disease. Hematologic:  Anemic according to husband.  I do not have Dr. Noland Fordyce labs at this time. Psycho-social:  History of depression.  History of anxiety.  SOCIAL and FAMILY HISTORY: Married.  Of [place] descent. Georgette Galloway's [phone]) mother.  She is the youngest daughter. Iantha Fallen daughter. Son - [name] Matson [phone]) - , Concha [place], lives in [place].    PHYSICAL EXAM: There were no vitals taken for this visit.  General: WF who is alert.  Somewhat anxious and little hard to understand.  She has thinning hair.   HEENT: Normal. Pupils equal. Lungs: Clear to auscultation and symmetric breath sounds. Heart:  RRR. No murmur or rub. Abdomen: Soft. No mass. No tenderness. No hernia. Normal bowel sounds.  Lower midline scar.  RLQ scar.  The incision looks good.  No mass or tenderness.  DATA REVIEWED: Epic notes   Alphonsa Overall, MD,  [facility], [place] [address]  [place], [place]    [place] Phone:  [phone] FAX:  [phone]

## 2014-01-15 ENCOUNTER — Encounter (HOSPITAL_COMMUNITY): Payer: Self-pay

## 2014-01-18 ENCOUNTER — Encounter (INDEPENDENT_AMBULATORY_CARE_PROVIDER_SITE_OTHER): Payer: Self-pay

## 2014-01-23 ENCOUNTER — Other Ambulatory Visit: Payer: Self-pay | Admitting: Oncology

## 2014-05-01 ENCOUNTER — Telehealth: Payer: Self-pay | Admitting: Oncology

## 2014-05-01 NOTE — Telephone Encounter (Signed)
r/s appt for 6/11 to 6/10 per BS due to call day. s/w pt she is aware.

## 2014-05-09 ENCOUNTER — Other Ambulatory Visit (HOSPITAL_BASED_OUTPATIENT_CLINIC_OR_DEPARTMENT_OTHER): Payer: Medicare Other

## 2014-05-09 ENCOUNTER — Telehealth: Payer: Self-pay | Admitting: Oncology

## 2014-05-09 ENCOUNTER — Ambulatory Visit (HOSPITAL_BASED_OUTPATIENT_CLINIC_OR_DEPARTMENT_OTHER): Payer: Medicare Other | Admitting: Oncology

## 2014-05-09 VITALS — BP 111/69 | HR 54 | Temp 98.3°F | Resp 18 | Ht 64.0 in | Wt 139.1 lb

## 2014-05-09 DIAGNOSIS — C182 Malignant neoplasm of ascending colon: Secondary | ICD-10-CM

## 2014-05-09 DIAGNOSIS — C189 Malignant neoplasm of colon, unspecified: Secondary | ICD-10-CM

## 2014-05-09 DIAGNOSIS — D649 Anemia, unspecified: Secondary | ICD-10-CM

## 2014-05-09 DIAGNOSIS — R911 Solitary pulmonary nodule: Secondary | ICD-10-CM

## 2014-05-09 DIAGNOSIS — E119 Type 2 diabetes mellitus without complications: Secondary | ICD-10-CM

## 2014-05-09 LAB — CBC WITH DIFFERENTIAL/PLATELET
BASO%: 0.8 % (ref 0.0–2.0)
Basophils Absolute: 0 10*3/uL (ref 0.0–0.1)
EOS%: 1 % (ref 0.0–7.0)
Eosinophils Absolute: 0.1 10*3/uL (ref 0.0–0.5)
HCT: 46.3 % (ref 34.8–46.6)
HEMOGLOBIN: 14.9 g/dL (ref 11.6–15.9)
LYMPH#: 1.8 10*3/uL (ref 0.9–3.3)
LYMPH%: 31 % (ref 14.0–49.7)
MCH: 29.4 pg (ref 25.1–34.0)
MCHC: 32.3 g/dL (ref 31.5–36.0)
MCV: 91.2 fL (ref 79.5–101.0)
MONO#: 0.6 10*3/uL (ref 0.1–0.9)
MONO%: 10.4 % (ref 0.0–14.0)
NEUT%: 56.8 % (ref 38.4–76.8)
NEUTROS ABS: 3.2 10*3/uL (ref 1.5–6.5)
Platelets: 160 10*3/uL (ref 145–400)
RBC: 5.08 10*6/uL (ref 3.70–5.45)
RDW: 14.2 % (ref 11.2–14.5)
WBC: 5.7 10*3/uL (ref 3.9–10.3)

## 2014-05-09 NOTE — Progress Notes (Signed)
  Anne [name] OFFICE PROGRESS NOTE   Diagnosis: Colon cancer  INTERVAL HISTORY:   She returns as scheduled. She reports anxiety. She has fullness in the right lower abdomen. Pruritus at the upper back. She takes Dulcolax for constipation. She continues iron.  Objective:  Vital signs in last 24 hours:  Blood pressure 111/69, pulse 54, temperature 98.3 F (36.8 C), temperature source Oral, resp. rate 18, height $RemoveBe'5\' 4"'SNaxNaHMY$  (1.626 m), weight 139 lb 1.6 oz (63.095 kg).    HEENT: Neck without mass Lymphatics: No cervical, supraclavicular, axillary, or inguinal nodes Resp: Lungs clear bilaterally Cardio: Regular rate and rhythm GI: No hepatosplenomegaly, nontender, no mass Vascular: No leg edema  Skin: Benign appearing moles at the upper back. No rash.     Lab Results:  Lab Results  Component Value Date   WBC 5.7 05/09/2014   HGB 14.9 05/09/2014   HCT 46.3 05/09/2014   MCV 91.2 05/09/2014   PLT 160 05/09/2014   NEUTROABS 3.2 05/09/2014    Lab Results  Component Value Date   NA 143 12/22/2013    Lab Results  Component Value Date   CEA 1.1 12/15/2013    Imaging:  No results found.  Medications: I have reviewed the patient's current medications.  Assessment/Plan: 1 stage IIIB (T3, N1c) adenocarcinoma of the ascending colon, grade 3, status post a laparoscopic assisted right colectomy 12/19/2013  20 benign lymph nodes, one soft tissue venous tumor deposit , Crohn's like response  Immunohistochemical testing confirmed loss of an MLH1 and PMS2 expression, BRAF V600E mutation positive 2. sigmoid colon polyp 12/13/2013  3. Tiny nonspecific pulmonary nodules on a chest CT 12/13/2013  4. Microcytic anemia-likely iron deficiency, resolved, she will discontinue iron 5. History of anxiety/depression  6. status post cholecystectomy 12/30/2011  7. type 2 diabetes    Disposition:  Anne [name] is in clinical remission from colon cancer. We will followup on the CEA from  today. She will return for an office visit and CEA in 6 months. She will discontinue iron.  Betsy Coder, MD  05/09/2014  11:34 AM

## 2014-05-09 NOTE — Telephone Encounter (Signed)
gv dn rpinted appt sched and avs for pt for DEC

## 2014-05-10 ENCOUNTER — Other Ambulatory Visit: Payer: Medicare Other

## 2014-05-10 ENCOUNTER — Telehealth: Payer: Self-pay | Admitting: *Deleted

## 2014-05-10 ENCOUNTER — Ambulatory Visit: Payer: Medicare Other | Admitting: Oncology

## 2014-05-10 LAB — CEA: CEA: 1.7 ng/mL (ref 0.0–5.0)

## 2014-05-10 NOTE — Telephone Encounter (Signed)
Notified of normal CEA. 

## 2014-05-10 NOTE — Telephone Encounter (Signed)
Message copied by Tania Ade on Thu May 10, 2014  5:09 PM ------      Message from: Betsy Coder B      Created: Thu May 10, 2014  7:09 AM       Please call patient, cea is normal ------

## 2014-08-29 ENCOUNTER — Encounter (INDEPENDENT_AMBULATORY_CARE_PROVIDER_SITE_OTHER): Payer: Medicare Other | Admitting: Surgery

## 2014-11-02 ENCOUNTER — Emergency Department (HOSPITAL_COMMUNITY): Payer: Medicare Other

## 2014-11-02 ENCOUNTER — Encounter (HOSPITAL_COMMUNITY): Payer: Self-pay

## 2014-11-02 ENCOUNTER — Emergency Department (HOSPITAL_COMMUNITY)
Admission: EM | Admit: 2014-11-02 | Discharge: 2014-11-02 | Disposition: A | Discharge status: Home / Self Care | Payer: Medicare Other | Attending: Emergency Medicine | Admitting: Emergency Medicine

## 2014-11-02 DIAGNOSIS — E039 Hypothyroidism, unspecified: Secondary | ICD-10-CM | POA: Insufficient documentation

## 2014-11-02 DIAGNOSIS — R1013 Epigastric pain: Secondary | ICD-10-CM | POA: Insufficient documentation

## 2014-11-02 DIAGNOSIS — Z79899 Other long term (current) drug therapy: Secondary | ICD-10-CM | POA: Insufficient documentation

## 2014-11-02 DIAGNOSIS — Z9049 Acquired absence of other specified parts of digestive tract: Secondary | ICD-10-CM | POA: Diagnosis not present

## 2014-11-02 DIAGNOSIS — N189 Chronic kidney disease, unspecified: Secondary | ICD-10-CM | POA: Diagnosis not present

## 2014-11-02 DIAGNOSIS — Z87440 Personal history of urinary (tract) infections: Secondary | ICD-10-CM | POA: Diagnosis not present

## 2014-11-02 DIAGNOSIS — R1030 Lower abdominal pain, unspecified: Secondary | ICD-10-CM | POA: Diagnosis not present

## 2014-11-02 DIAGNOSIS — F329 Major depressive disorder, single episode, unspecified: Secondary | ICD-10-CM | POA: Insufficient documentation

## 2014-11-02 DIAGNOSIS — K5900 Constipation, unspecified: Secondary | ICD-10-CM | POA: Diagnosis not present

## 2014-11-02 DIAGNOSIS — F419 Anxiety disorder, unspecified: Secondary | ICD-10-CM | POA: Insufficient documentation

## 2014-11-02 DIAGNOSIS — M1990 Unspecified osteoarthritis, unspecified site: Secondary | ICD-10-CM | POA: Diagnosis not present

## 2014-11-02 DIAGNOSIS — Z90710 Acquired absence of both cervix and uterus: Secondary | ICD-10-CM | POA: Insufficient documentation

## 2014-11-02 DIAGNOSIS — Z85038 Personal history of other malignant neoplasm of large intestine: Secondary | ICD-10-CM | POA: Diagnosis not present

## 2014-11-02 DIAGNOSIS — R079 Chest pain, unspecified: Secondary | ICD-10-CM | POA: Diagnosis not present

## 2014-11-02 LAB — CBC WITH DIFFERENTIAL/PLATELET
BASOS PCT: 0 % (ref 0–1)
Basophils Absolute: 0 10*3/uL (ref 0.0–0.1)
Eosinophils Absolute: 0.1 10*3/uL (ref 0.0–0.7)
Eosinophils Relative: 1 % (ref 0–5)
HCT: 47 % — ABNORMAL HIGH (ref 36.0–46.0)
Hemoglobin: 16 g/dL — ABNORMAL HIGH (ref 12.0–15.0)
Lymphocytes Relative: 32 % (ref 12–46)
Lymphs Abs: 1.8 10*3/uL (ref 0.7–4.0)
MCH: 31.9 pg (ref 26.0–34.0)
MCHC: 34 g/dL (ref 30.0–36.0)
MCV: 93.6 fL (ref 78.0–100.0)
MONOS PCT: 7 % (ref 3–12)
Monocytes Absolute: 0.4 10*3/uL (ref 0.1–1.0)
NEUTROS ABS: 3.4 10*3/uL (ref 1.7–7.7)
Neutrophils Relative %: 60 % (ref 43–77)
Platelets: 168 10*3/uL (ref 150–400)
RBC: 5.02 MIL/uL (ref 3.87–5.11)
RDW: 12.3 % (ref 11.5–15.5)
WBC: 5.6 10*3/uL (ref 4.0–10.5)

## 2014-11-02 LAB — URINALYSIS, ROUTINE W REFLEX MICROSCOPIC
Bilirubin Urine: NEGATIVE
GLUCOSE, UA: NEGATIVE mg/dL
Ketones, ur: NEGATIVE mg/dL
Nitrite: NEGATIVE
Protein, ur: NEGATIVE mg/dL
SPECIFIC GRAVITY, URINE: 1.007 (ref 1.005–1.030)
Urobilinogen, UA: 0.2 mg/dL (ref 0.0–1.0)
pH: 5 (ref 5.0–8.0)

## 2014-11-02 LAB — TROPONIN I: Troponin I: <0.3 ng/mL (ref <0.30)

## 2014-11-02 LAB — COMPREHENSIVE METABOLIC PANEL
ALT: 13 U/L (ref 0–35)
AST: 17 U/L (ref 0–37)
Albumin: 3.9 g/dL (ref 3.5–5.2)
Alkaline Phosphatase: 59 U/L (ref 39–117)
Anion gap: 12 (ref 5–15)
BUN: 20 mg/dL (ref 6–23)
CALCIUM: 9.9 mg/dL (ref 8.4–10.5)
CO2: 28 mEq/L (ref 19–32)
Chloride: 101 mEq/L (ref 96–112)
Creatinine, Ser: 0.92 mg/dL (ref 0.50–1.10)
GFR, EST AFRICAN AMERICAN: 65 mL/min — AB (ref >90)
GFR, EST NON AFRICAN AMERICAN: 56 mL/min — AB (ref >90)
GLUCOSE: 114 mg/dL — AB (ref 70–99)
Potassium: 4.6 mEq/L (ref 3.7–5.3)
Sodium: 141 mEq/L (ref 137–147)
TOTAL PROTEIN: 7.8 g/dL (ref 6.0–8.3)
Total Bilirubin: 0.5 mg/dL (ref 0.3–1.2)

## 2014-11-02 LAB — LIPASE, BLOOD: Lipase: 27 U/L (ref 11–59)

## 2014-11-02 LAB — URINE MICROSCOPIC-ADD ON

## 2014-11-02 NOTE — ED Provider Notes (Addendum)
CSN: [id number]     Arrival date & time 11/02/14  1223 History   First MD Initiated Contact with Patient 11/02/14 1348     Chief Complaint  Patient presents with  . Abdominal Pain  . Chest Pain     Patient is a 80 y.o. female presenting with abdominal pain and chest pain. The history is provided by the patient and the spouse.  Abdominal Pain Associated symptoms: chest pain   Chest Pain Associated symptoms: abdominal pain    Patient presents for evaluation of epigastric abdominal pain and lower abdominal pain. She reports both of these pains up and present for the last 3 years, but they have been more intense in nature over the last several days to weeks. She describes the pain as sharp and intermittent, there is none present currently. She states that she feels bloated when she eats and the numbness goes away. She has nausea but no vomiting. She denies fevers, cough, shortness of breath. She had a colonoscopy on Monday. She states her home Xanax does help some of her pain. Symptoms are moderate, intermitent, worsening. Patient has a history of stage III B colon cancer status post resection 11 months ago.  Past Medical History  Diagnosis Date  . Thyroid disease   . Constipation   . Abdominal pain   . Abdominal distension   . Hypothyroidism   . GERD (gastroesophageal reflux disease)   . Chronic kidney disease     hx uti 1 mo ago was med tx   . Depression   . Anxiety     facial twitch and click , twitch lt shoulder  due to anxiety  . No pertinent past medical history     OCC PAINS TO ABDOMEN AND CHEST , DR BYERLY PLACED PATIENT ON MED FOR GERD FOR 2 WEEKS  . Arthritis     right first finger  . Itching     at times, area varies   Past Surgical History  Procedure Laterality Date  . Bladder tack      Patient does not remember date  . Vericose vein removal      legs. does not remember date of procedure  . Appendectomy  1948  . Nose surgery      1970  . Cholecystectomy   12/30/2011    Procedure: LAPAROSCOPIC CHOLECYSTECTOMY WITH INTRAOPERATIVE CHOLANGIOGRAM;  Surgeon: Stark Klein, MD;  Location: [facility];  Service: General;  Laterality: N/A;  . Abdominal hysterectomy      partial - patient does not remember date  . Colonscopy  01-12-2014  . Laparoscopic right hemi colectomy Right 12/19/2013    Procedure: LAPAROSCOPIC RIGHT HEMI COLECTOMY;  Surgeon: Shann Medal, MD;  Location: [facility];  Service: General;  Laterality: Right;   Family History  Problem Relation Age of Onset  . Stroke Mother   . Cancer Sister     breast  . Cancer Brother     lung and thyroid   History  Substance Use Topics  . Smoking status: Never Smoker   . Smokeless tobacco: Never Used  . Alcohol Use: No   OB History    No data available     Review of Systems  Cardiovascular: Positive for chest pain.  Gastrointestinal: Positive for abdominal pain.  All other systems reviewed and are negative.     Allergies  Review of patient's allergies indicates no known allergies.  Home Medications   Prior to Admission medications   Medication Sig Start Date End Date  Taking? Authorizing Provider  ALPRAZolam (XANAX) 0.25 MG tablet Take 0.125 mg by mouth 2 (two) times daily.     Historical Provider, MD  Bisacodyl (DULCOLAX PO) Take 2 capsules by mouth at bedtime.    Historical Provider, MD  Cholecalciferol (VITAMIN D3) 1000 UNITS CAPS Take 1,000 Units by mouth daily.     Historical Provider, MD  ferrous sulfate 325 (65 FE) MG tablet Take 325 mg by mouth daily with breakfast.     Historical Provider, MD  HYDROcodone-acetaminophen (NORCO/VICODIN) 5-325 MG per tablet Take 1-2 tablets by mouth every 6 (six) hours as needed for moderate pain. 12/25/13   Alphonsa Overall, MD  Multiple Vitamin (MULTIVITAMIN WITH MINERALS) TABS tablet Take 1 tablet by mouth daily.    Historical Provider, MD  simvastatin (ZOCOR) 20 MG tablet Take 20 mg by mouth every evening.    Historical Provider, MD  SYNTHROID 88 MCG  tablet Take 88 mcg by mouth daily before breakfast.  08/17/11   Historical Provider, MD  UNKNOWN TO PATIENT OTC natural supplement for immune system and energy.    Historical Provider, MD   BP 150/76 mmHg  Pulse 60  Temp(Src) 97.6 F (36.4 C) (Oral)  Resp 18  SpO2 99% Physical Exam  Constitutional: She is oriented to person, place, and time. She appears well-developed and well-nourished.  HENT:  Head: Normocephalic and atraumatic.  Cardiovascular: Normal rate and regular rhythm.   No murmur heard. Pulmonary/Chest: Effort normal and breath sounds normal. No respiratory distress.  Abdominal: Soft. There is no rebound and no guarding.  Mild epigastric tenderness  Musculoskeletal: She exhibits no edema or tenderness.  Neurological: She is alert and oriented to person, place, and time.  Skin: Skin is warm and dry.  Psychiatric: She has a normal mood and affect. Her behavior is normal.  Nursing note and vitals reviewed.   ED Course  Procedures (including critical care time) Labs Review Labs Reviewed  COMPREHENSIVE METABOLIC PANEL - Abnormal; Notable for the following:    Glucose, Bld 114 (*)    GFR calc non Af Amer 56 (*)    GFR calc Af Amer 65 (*)    All other components within normal limits  CBC WITH DIFFERENTIAL - Abnormal; Notable for the following:    Hemoglobin 16.0 (*)    HCT 47.0 (*)    All other components within normal limits  URINALYSIS, ROUTINE W REFLEX MICROSCOPIC - Abnormal; Notable for the following:    Hgb urine dipstick MODERATE (*)    Leukocytes, UA MODERATE (*)    All other components within normal limits  LIPASE, BLOOD  TROPONIN I  URINE MICROSCOPIC-ADD ON    Imaging Review Dg Abd Acute W/chest  11/02/2014   CLINICAL DATA:  Abdominal pain radiating up to chest 4 months, nausea, history carcinoma of the colon  EXAM: ACUTE ABDOMEN SERIES (ABDOMEN 2 VIEW & CHEST 1 VIEW)  COMPARISON:  CT chest abdomen pelvis 12/13/2013  FINDINGS: Enlargement of cardiac  silhouette.  Calcified tortuous aorta.  Mediastinal contours and pulmonary vascularity normal.  Minimal subsegmental atelectasis at RIGHT base.  Lungs otherwise clear.  No pleural effusion or pneumothorax.  Normal bowel gas pattern.  No bowel dilatation, bowel wall thickening, or free intraperitoneal air.  Surgical clips RIGHT upper quadrant from prior cholecystectomy.  No urinary tract calcification.  Bones demineralized.  IMPRESSION: Enlargement of cardiac silhouette.  Minimal RIGHT basilar subsegmental atelectasis.  No acute abdominal findings.   Electronically Signed   By: Crist Infante.D.  On: 11/02/2014 16:01     EKG Interpretation None      MDM   Final diagnoses:  Epigastric pain  Patient with history of colon cancer here with episodic epigastric and lower abdominal pain that have been intermittent for three years, there is no current pain in the emergency department. She is anxious and has an appointment on Monday with her physician. Her pain resolves with Xanax.  Clinical picture is not consistent with acute coronary syndrome, bowel obstruction, serious bacterial infection, PE. Offered patient reassurance and discussed close outpatient follow-up. Return precautions were also discussed.    Quintella Reichert, MD 11/02/14 [place], MD 11/02/14 [phone]

## 2014-11-02 NOTE — ED Notes (Signed)
Patient transported to CT 

## 2014-11-02 NOTE — ED Notes (Signed)
Did not start IV. MD states to hold off on IV until blood work results.

## 2014-11-02 NOTE — ED Notes (Addendum)
Hx of colon cancer, but last colonoscopy reports it is clear. Pt's husband at the bedside states pt has appt on Monday with Dr. Benay Spice to follow up again. Pt c/o abdominal bloating and chest pain. States it is worse at times with stress. Has been having this pain for years. Pt's husband reports she is "uptight and always afraid the cancer has returned". Pt admits to having this fear, states "it's only natural". Intermittent nausea, no vomiting. Pt called Gynecologist today and spoke to the nurse telling her about her chest pain, that nurse sent pt to ED.

## 2014-11-02 NOTE — Discharge Instructions (Signed)

## 2014-11-02 NOTE — ED Notes (Signed)
Pt states abdominal pain radiating up into chest for months.  Pt had colonoscopy on Monday.  No bleeding.  Nausea with no vomiting.  Pt recently on antibiotics

## 2014-11-05 ENCOUNTER — Telehealth: Payer: Self-pay | Admitting: Oncology

## 2014-11-05 ENCOUNTER — Ambulatory Visit (HOSPITAL_BASED_OUTPATIENT_CLINIC_OR_DEPARTMENT_OTHER): Payer: Medicare Other | Admitting: Oncology

## 2014-11-05 ENCOUNTER — Other Ambulatory Visit (HOSPITAL_BASED_OUTPATIENT_CLINIC_OR_DEPARTMENT_OTHER): Payer: Medicare Other

## 2014-11-05 VITALS — BP 129/69 | HR 54 | Temp 97.8°F | Resp 18 | Ht 64.0 in | Wt 146.3 lb

## 2014-11-05 DIAGNOSIS — C189 Malignant neoplasm of colon, unspecified: Secondary | ICD-10-CM

## 2014-11-05 DIAGNOSIS — C182 Malignant neoplasm of ascending colon: Secondary | ICD-10-CM

## 2014-11-05 NOTE — Progress Notes (Signed)
  [facility] OFFICE PROGRESS NOTE   Diagnosis: Colon cancer  INTERVAL HISTORY:   She returns as scheduled. Good appetite. She reports undergoing a surveillance colonoscopy by Dr. Collene Mares last week. She has a history of low substernal burning chest discomfort. The pain is intermittent and can last for hours. She was evaluated in the emergency room for the pain last week. The pain has been present intermittently for years and she reports relief after medical treatment by Dr. Collene Mares in the past.  Objective:  Vital signs in last 24 hours:  Blood pressure 129/69, pulse 54, temperature 97.8 F (36.6 C), temperature source Oral, resp. rate 18, height $RemoveBe'5\' 4"'QoHKPALPU$  (1.626 m), weight 146 lb 4.8 oz (66.361 kg), SpO2 100 %.    HEENT: Neck without mass Lymphatics: No cervical, supraclavicular, axillary, or inguinal nodes Resp: Lungs clear bilaterally Cardio: Regular rate and rhythm GI: No hepatomegaly, nontender, no mass Vascular: No leg edema   Lab Results:  Lab Results  Component Value Date   WBC 5.6 11/02/2014   HGB 16.0* 11/02/2014   HCT 47.0* 11/02/2014   MCV 93.6 11/02/2014   PLT 168 11/02/2014   NEUTROABS 3.4 11/02/2014     Lab Results  Component Value Date   CEA 1.7 05/09/2014    Imaging:  Dg Abd Acute W/chest  11/02/2014   CLINICAL DATA:  Abdominal pain radiating up to chest 4 months, nausea, history carcinoma of the colon  EXAM: ACUTE ABDOMEN SERIES (ABDOMEN 2 VIEW & CHEST 1 VIEW)  COMPARISON:  CT chest abdomen pelvis 12/13/2013  FINDINGS: Enlargement of cardiac silhouette.  Calcified tortuous aorta.  Mediastinal contours and pulmonary vascularity normal.  Minimal subsegmental atelectasis at RIGHT base.  Lungs otherwise clear.  No pleural effusion or pneumothorax.  Normal bowel gas pattern.  No bowel dilatation, bowel wall thickening, or free intraperitoneal air.  Surgical clips RIGHT upper quadrant from prior cholecystectomy.  No urinary tract calcification.  Bones  demineralized.  IMPRESSION: Enlargement of cardiac silhouette.  Minimal RIGHT basilar subsegmental atelectasis.  No acute abdominal findings.   Electronically Signed   By: Lavonia Dana M.D.   On: 11/02/2014 16:01    Medications: I have reviewed the patient's current medications.  Assessment/Plan: 1.stage IIIB (T3, N1c) adenocarcinoma of the ascending colon, grade 3, status post a laparoscopic assisted right colectomy 12/19/2013   20 benign lymph nodes, one soft tissue venous tumor deposit , Crohn's like response   Immunohistochemical testing confirmed loss of an MLH1 and PMS2 expression, BRAF V600E mutation positive 2. sigmoid colon polyp 12/13/2013  3. Tiny nonspecific pulmonary nodules on a chest CT 12/13/2013  4. Microcytic anemia-likely iron deficiency, resolved. 5. History of anxiety/depression  6. status post cholecystectomy 12/30/2011  7. type 2 diabetes     Disposition:  She remains in clinical remission from colon cancer. We will follow-up on the CEA from today. She will return for an office visit and CEA in 6 months. I suspect the chest discomfort is related to a benign musculoskeletal condition, anxiety, or reflux. She will try an over-the-counter antiacid and follow-up with Dr. Collene Mares.  Betsy Coder, MD  11/05/2014  4:23 PM

## 2014-11-05 NOTE — Telephone Encounter (Signed)
Pt confirmed labs/ov per 12/07 POF, gave pt AVS... KJ

## 2014-11-06 ENCOUNTER — Telehealth: Payer: Self-pay | Admitting: *Deleted

## 2014-11-06 LAB — CEA: CEA: 1.8 ng/mL (ref 0.0–5.0)

## 2014-11-06 NOTE — Telephone Encounter (Signed)
Per Dr. Benay Spice; notified pt that cea is normal.  Pt verbalized understanding and expressed appreciation for call back.

## 2014-11-06 NOTE — Telephone Encounter (Signed)
-----   Message from Ladell Pier, MD sent at 11/06/2014  9:52 AM EST ----- Please call patient, cea is normal

## 2015-01-02 ENCOUNTER — Telehealth: Payer: Self-pay

## 2015-01-02 NOTE — Telephone Encounter (Signed)
Per Dr. Benay Spice; notified pt that MD instructed her to go to her PCP first re: symptoms.  Pt verbalized understanding and states she would call them.
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
## 2015-02-28 DIAGNOSIS — E785 Hyperlipidemia, unspecified: Secondary | ICD-10-CM | POA: Diagnosis not present

## 2015-02-28 DIAGNOSIS — Z0000 Encounter for general adult medical examination without abnormal findings: Secondary | ICD-10-CM | POA: Diagnosis not present

## 2015-02-28 DIAGNOSIS — Z1389 Encounter for screening for other disorder: Secondary | ICD-10-CM | POA: Diagnosis not present

## 2015-02-28 DIAGNOSIS — Z23 Encounter for immunization: Secondary | ICD-10-CM | POA: Diagnosis not present

## 2015-02-28 DIAGNOSIS — E1329 Other specified diabetes mellitus with other diabetic kidney complication: Secondary | ICD-10-CM | POA: Diagnosis not present

## 2015-02-28 DIAGNOSIS — E559 Vitamin D deficiency, unspecified: Secondary | ICD-10-CM | POA: Diagnosis not present

## 2015-03-07 DIAGNOSIS — E785 Hyperlipidemia, unspecified: Secondary | ICD-10-CM | POA: Diagnosis not present

## 2015-03-07 DIAGNOSIS — E039 Hypothyroidism, unspecified: Secondary | ICD-10-CM | POA: Diagnosis not present

## 2015-03-07 DIAGNOSIS — E1122 Type 2 diabetes mellitus with diabetic chronic kidney disease: Secondary | ICD-10-CM | POA: Diagnosis not present

## 2015-03-07 DIAGNOSIS — N183 Chronic kidney disease, stage 3 (moderate): Secondary | ICD-10-CM | POA: Diagnosis not present

## 2015-04-03 DIAGNOSIS — L989 Disorder of the skin and subcutaneous tissue, unspecified: Secondary | ICD-10-CM | POA: Diagnosis not present

## 2015-05-02 DIAGNOSIS — H2513 Age-related nuclear cataract, bilateral: Secondary | ICD-10-CM | POA: Diagnosis not present

## 2015-05-16 ENCOUNTER — Ambulatory Visit (HOSPITAL_BASED_OUTPATIENT_CLINIC_OR_DEPARTMENT_OTHER): Payer: Medicare Other | Admitting: Oncology

## 2015-05-16 ENCOUNTER — Other Ambulatory Visit (HOSPITAL_BASED_OUTPATIENT_CLINIC_OR_DEPARTMENT_OTHER): Payer: Medicare Other

## 2015-05-16 ENCOUNTER — Telehealth: Payer: Self-pay | Admitting: Oncology

## 2015-05-16 VITALS — BP 120/68 | HR 54 | Temp 98.2°F | Resp 18 | Ht 64.0 in | Wt 145.2 lb

## 2015-05-16 DIAGNOSIS — C182 Malignant neoplasm of ascending colon: Secondary | ICD-10-CM

## 2015-05-16 DIAGNOSIS — C189 Malignant neoplasm of colon, unspecified: Secondary | ICD-10-CM

## 2015-05-16 LAB — CBC WITH DIFFERENTIAL/PLATELET
BASO%: 0.7 % (ref 0.0–2.0)
Basophils Absolute: 0 10*3/uL (ref 0.0–0.1)
EOS%: 1.4 % (ref 0.0–7.0)
Eosinophils Absolute: 0.1 10*3/uL (ref 0.0–0.5)
HEMATOCRIT: 44.4 % (ref 34.8–46.6)
HGB: 14.7 g/dL (ref 11.6–15.9)
LYMPH%: 28.3 % (ref 14.0–49.7)
MCH: 30.9 pg (ref 25.1–34.0)
MCHC: 33.1 g/dL (ref 31.5–36.0)
MCV: 93.1 fL (ref 79.5–101.0)
MONO#: 0.4 10*3/uL (ref 0.1–0.9)
MONO%: 8.2 % (ref 0.0–14.0)
NEUT%: 61.4 % (ref 38.4–76.8)
NEUTROS ABS: 3.4 10*3/uL (ref 1.5–6.5)
PLATELETS: 166 10*3/uL (ref 145–400)
RBC: 4.77 10*6/uL (ref 3.70–5.45)
RDW: 12.6 % (ref 11.2–14.5)
WBC: 5.5 10*3/uL (ref 3.9–10.3)
lymph#: 1.6 10*3/uL (ref 0.9–3.3)

## 2015-05-16 NOTE — Telephone Encounter (Signed)
per pof tos ch pt appt-gave avs

## 2015-05-16 NOTE — Progress Notes (Signed)
  [facility] OFFICE PROGRESS NOTE   Diagnosis: Colon cancer  INTERVAL HISTORY:   [name] returns as scheduled. She has multiple complaints including intermittent discomfort in the low anterior chest, "swelling "in the right abdomen after eating, and intermittent right abdominal pain. She reports undergoing a surveillance colonoscopy by Dr. Collene Mares in November of last year.  Objective:  Vital signs in last 24 hours:  Blood pressure 112/72, pulse 113, temperature 98.2 F (36.8 C), temperature source Oral, resp. rate 18, height $RemoveBe'5\' 4"'zqbiJhtuc$  (1.626 m), weight 145 lb 3.2 oz (65.862 kg), SpO2 100 %.    HEENT: Neck without mass Lymphatics: No cervical, supraclavicular, axillary, or inguinal nodes Resp: Lungs clear bilaterally Cardio: Regular rate and rhythm GI: No hepatosplenomegaly, no mass, mild tenderness in the right and left low abdomen Vascular: No leg edema  Lab Results:  Lab Results  Component Value Date   WBC 5.5 05/16/2015   HGB 14.7 05/16/2015   HCT 44.4 05/16/2015   MCV 93.1 05/16/2015   PLT 166 05/16/2015   NEUTROABS 3.4 05/16/2015    Lab Results  Component Value Date   CEA 1.8 11/05/2014     Medications: I have reviewed the patient's current medications.  Assessment/Plan: 1.stage IIIB (T3, N1c) adenocarcinoma of the ascending colon, grade 3, status post a laparoscopic assisted right colectomy 12/19/2013   20 benign lymph nodes, one soft tissue venous tumor deposit , Crohn's like response   Immunohistochemical testing confirmed loss of an MLH1 and PMS2 expression, BRAF V600E mutation positive 2. sigmoid colon polyp 12/13/2013  3. Tiny nonspecific pulmonary nodules on a chest CT 12/13/2013  4. Microcytic anemia-likely iron deficiency, resolved. 5. History of anxiety/depression  6. status post cholecystectomy 12/30/2011  7. type 2 diabetes     Disposition:  She remains in clinical remission from colon cancer. We will follow-up on the  CEA from today. She will return for an office visit and CEA in 6 months. She will continue surveillance colonoscopies with Dr. Collene Mares.  Betsy Coder, MD  05/16/2015  4:26 PM

## 2015-05-17 ENCOUNTER — Telehealth: Payer: Self-pay | Admitting: *Deleted

## 2015-05-17 LAB — CEA: CEA: 1.5 ng/mL (ref 0.0–5.0)

## 2015-05-17 NOTE — Telephone Encounter (Signed)
Call from pt requesting CEA result. Result given. Normal.

## 2015-05-29 DIAGNOSIS — E1122 Type 2 diabetes mellitus with diabetic chronic kidney disease: Secondary | ICD-10-CM | POA: Diagnosis not present

## 2015-05-29 DIAGNOSIS — E785 Hyperlipidemia, unspecified: Secondary | ICD-10-CM | POA: Diagnosis not present

## 2015-05-29 DIAGNOSIS — E559 Vitamin D deficiency, unspecified: Secondary | ICD-10-CM | POA: Diagnosis not present

## 2015-06-05 DIAGNOSIS — E1122 Type 2 diabetes mellitus with diabetic chronic kidney disease: Secondary | ICD-10-CM | POA: Diagnosis not present

## 2015-06-05 DIAGNOSIS — N183 Chronic kidney disease, stage 3 (moderate): Secondary | ICD-10-CM | POA: Diagnosis not present

## 2015-06-05 DIAGNOSIS — E785 Hyperlipidemia, unspecified: Secondary | ICD-10-CM | POA: Diagnosis not present

## 2015-06-05 DIAGNOSIS — E039 Hypothyroidism, unspecified: Secondary | ICD-10-CM | POA: Diagnosis not present

## 2015-07-24 ENCOUNTER — Telehealth: Payer: Self-pay | Admitting: *Deleted

## 2015-07-24 ENCOUNTER — Encounter: Payer: Self-pay | Admitting: Nurse Practitioner

## 2015-07-24 ENCOUNTER — Other Ambulatory Visit (HOSPITAL_BASED_OUTPATIENT_CLINIC_OR_DEPARTMENT_OTHER): Payer: Medicare Other

## 2015-07-24 ENCOUNTER — Encounter: Payer: Medicare Other | Admitting: Nurse Practitioner

## 2015-07-24 ENCOUNTER — Ambulatory Visit (HOSPITAL_BASED_OUTPATIENT_CLINIC_OR_DEPARTMENT_OTHER): Payer: Medicare Other | Admitting: Nurse Practitioner

## 2015-07-24 ENCOUNTER — Other Ambulatory Visit: Payer: Medicare Other

## 2015-07-24 VITALS — BP 119/75 | HR 60 | Temp 96.9°F | Resp 17 | Ht 64.0 in | Wt 144.1 lb

## 2015-07-24 DIAGNOSIS — N390 Urinary tract infection, site not specified: Secondary | ICD-10-CM

## 2015-07-24 DIAGNOSIS — C189 Malignant neoplasm of colon, unspecified: Secondary | ICD-10-CM

## 2015-07-24 DIAGNOSIS — K219 Gastro-esophageal reflux disease without esophagitis: Secondary | ICD-10-CM | POA: Diagnosis not present

## 2015-07-24 DIAGNOSIS — C182 Malignant neoplasm of ascending colon: Secondary | ICD-10-CM | POA: Diagnosis not present

## 2015-07-24 DIAGNOSIS — R319 Hematuria, unspecified: Principal | ICD-10-CM

## 2015-07-24 LAB — COMPREHENSIVE METABOLIC PANEL (CC13)
ALT: 15 U/L (ref 0–55)
ANION GAP: 10 meq/L (ref 3–11)
AST: 16 U/L (ref 5–34)
Albumin: 3.7 g/dL (ref 3.5–5.0)
Alkaline Phosphatase: 57 U/L (ref 40–150)
BUN: 25.1 mg/dL (ref 7.0–26.0)
CHLORIDE: 106 meq/L (ref 98–109)
CO2: 27 meq/L (ref 22–29)
CREATININE: 1 mg/dL (ref 0.6–1.1)
Calcium: 9.3 mg/dL (ref 8.4–10.4)
EGFR: 53 mL/min/{1.73_m2} — ABNORMAL LOW (ref >90)
Glucose: 103 mg/dl (ref 70–140)
Potassium: 3.7 mEq/L (ref 3.5–5.1)
Sodium: 142 mEq/L (ref 136–145)
Total Bilirubin: 0.66 mg/dL (ref 0.20–1.20)
Total Protein: 7 g/dL (ref 6.4–8.3)

## 2015-07-24 LAB — CBC WITH DIFFERENTIAL/PLATELET
BASO%: 0.4 % (ref 0.0–2.0)
Basophils Absolute: 0 10*3/uL (ref 0.0–0.1)
EOS%: 1.4 % (ref 0.0–7.0)
Eosinophils Absolute: 0.1 10*3/uL (ref 0.0–0.5)
HCT: 44.8 % (ref 34.8–46.6)
HGB: 14.7 g/dL (ref 11.6–15.9)
LYMPH%: 32.9 % (ref 14.0–49.7)
MCH: 30.6 pg (ref 25.1–34.0)
MCHC: 32.7 g/dL (ref 31.5–36.0)
MCV: 93.4 fL (ref 79.5–101.0)
MONO#: 0.5 10*3/uL (ref 0.1–0.9)
MONO%: 9.4 % (ref 0.0–14.0)
NEUT#: 3.2 10*3/uL (ref 1.5–6.5)
NEUT%: 55.9 % (ref 38.4–76.8)
PLATELETS: 165 10*3/uL (ref 145–400)
RBC: 4.8 10*6/uL (ref 3.70–5.45)
RDW: 13.1 % (ref 11.2–14.5)
WBC: 5.7 10*3/uL (ref 3.9–10.3)
lymph#: 1.9 10*3/uL (ref 0.9–3.3)

## 2015-07-24 LAB — URINALYSIS, MICROSCOPIC - CHCC
Bilirubin (Urine): NEGATIVE
Glucose: NEGATIVE mg/dL
Ketones: NEGATIVE mg/dL
NITRITE: NEGATIVE
Protein: NEGATIVE mg/dL
Specific Gravity, Urine: 1.02 (ref 1.003–1.035)
Urobilinogen, UR: 0.2 mg/dL (ref 0.2–1)
pH: 5 (ref 4.6–8.0)

## 2015-07-24 MED ORDER — SUCRALFATE 1 GM/10ML PO SUSP: 1.0000 g | Freq: Three times a day (TID) | ORAL | Status: DC

## 2015-07-24 MED ORDER — CIPROFLOXACIN HCL 500 MG PO TABS: 500.0000 mg | ORAL_TABLET | Freq: Two times a day (BID) | ORAL | Status: DC

## 2015-07-24 NOTE — Assessment & Plan Note (Addendum)
1.stage IIIB (T3, N1c) adenocarcinoma of the ascending colon, grade 3, status post a laparoscopic assisted right colectomy 12/19/2013   20 benign lymph nodes, one soft tissue venous tumor deposit , Crohn's like response   Immunohistochemical testing confirmed loss of an MLH1 and PMS2 expression, BRAF V600E mutation positive 2. sigmoid colon polyp 12/13/2013  3. Tiny nonspecific pulmonary nodules on a chest CT 12/13/2013  4. Microcytic anemia-likely iron deficiency, resolved. 5. History of anxiety/depression  6. status post cholecystectomy 12/30/2011  7. type 2 diabetes   Patient is currently undergoing observation only for her previously diagnosed colon cancer.  Patient's last CEA on 05/16/2015 was 1.5.  Patient continues with complaints of achiness to the right side of her stomach on occasion; her left lateral rib area, and GERD-like symptoms for the past "several years".   Labs obtained today were essentially stable.  However, patient did appear to have a questionable UTI with gross hematuria.  In regards to patient's previous colon cancer-does appear the patient continues in remission.  Patient was advised to follow-up with her primary care provider in regards to her other chronic complaints that are non--cancer related.    Patient is scheduled for labs and follow up visit on 11/15/2015.  Patient is to call or return for any new or worsening symptoms.

## 2015-07-24 NOTE — Telephone Encounter (Signed)
Husband called and wants his wife to be seen by Dr. Benay Spice today.  He states her stomach is swelling and she has pain from her bladder to her head.  Requested that they come in to see Selena Lesser NP who will consult with Dr. Benay Spice if they can come this am.  Husband says he needs a shower and they can not get her until 11:30.  Let them know that we will do lab at 11:30 (be prepared for collection of urine specimen) and then to see Selena Lesser NP.  Asked them to come sooner if at all possible.

## 2015-07-24 NOTE — Progress Notes (Signed)
SYMPTOM MANAGEMENT CLINIC   HPI: Anne [name] 81 y.o. female diagnosed with colon cancer.  Patient is status post right colectomy in the past.  She has been undergoing observation only.   1.stage IIIB (T3, N1c) adenocarcinoma of the ascending colon, grade 3, status post a laparoscopic assisted right colectomy 12/19/2013   20 benign lymph nodes, one soft tissue venous tumor deposit , Crohn's like response   Immunohistochemical testing confirmed loss of an MLH1 and PMS2 expression, BRAF V600E mutation positive 2. sigmoid colon polyp 12/13/2013  3. Tiny nonspecific pulmonary nodules on a chest CT 12/13/2013  4. Microcytic anemia-likely iron deficiency, resolved. 5. History of anxiety/depression  6. status post cholecystectomy 12/30/2011  7. type 2 diabetes   Patient is currently undergoing observation only for her previously diagnosed colon cancer.  Patient's last CEA on 05/16/2015 was 1.5.  Patient continues with complaints of achiness to the right side of her stomach on occasion; her left lateral rib area, and GERD-like symptoms for the past "several years".   HPI  ROS  Past Medical History  Diagnosis Date  . Thyroid disease   . Constipation   . Abdominal pain   . Abdominal distension   . Hypothyroidism   . GERD (gastroesophageal reflux disease)   . Chronic kidney disease     hx uti 1 mo ago was med tx   . Depression   . Anxiety     facial twitch and click , twitch lt shoulder  due to anxiety  . No pertinent past medical history     OCC PAINS TO ABDOMEN AND CHEST , DR BYERLY PLACED PATIENT ON MED FOR GERD FOR 2 WEEKS  . Arthritis     right first finger  . Itching     at times, area varies    Past Surgical History  Procedure Laterality Date  . Bladder tack      Patient does not remember date  . Vericose vein removal      legs. does not remember date of procedure  . Appendectomy  1948  . Nose surgery      1970  . Cholecystectomy  12/30/2011   Procedure: LAPAROSCOPIC CHOLECYSTECTOMY WITH INTRAOPERATIVE CHOLANGIOGRAM;  Surgeon: Stark Klein, MD;  Location: [place];  Service: General;  Laterality: N/A;  . Abdominal hysterectomy      partial - patient does not remember date  . Colonscopy  01-12-2014  . Laparoscopic right hemi colectomy Right 12/19/2013    Procedure: LAPAROSCOPIC RIGHT HEMI COLECTOMY;  Surgeon: Shann Medal, MD;  Location: [facility];  Service: General;  Laterality: Right;    has Dysuria; Cholelithiasis; Colon cancer; Cancer of ascending colon; UTI (urinary tract infection); and GERD (gastroesophageal reflux disease) on her problem list.    has No Known Allergies.    Medication List       This list is accurate as of: 07/24/15  6:12 PM.  Always use your most recent med list.               ALPRAZolam 0.25 MG tablet  Commonly known as:  XANAX  Take 0.125 mg by mouth 2 (two) times daily.     ciprofloxacin 500 MG tablet  Commonly known as:  CIPRO  Take 1 tablet (500 mg total) by mouth 2 (two) times daily.     docusate sodium 100 MG capsule  Commonly known as:  COLACE  Take 100 mg by mouth daily as needed for mild constipation (constipation).  feeding supplement (GLUCERNA SHAKE) Liqd  Take 237 mLs by mouth daily.     multivitamin with minerals Tabs tablet  Take 1 tablet by mouth daily.     polyethylene glycol packet  Commonly known as:  MIRALAX / GLYCOLAX  Take 17 g by mouth daily as needed for mild constipation (constipation).     simvastatin 20 MG tablet  Commonly known as:  ZOCOR  Take 20 mg by mouth every evening.     sucralfate 1 GM/10ML suspension  Commonly known as:  CARAFATE  Take 10 mLs (1 g total) by mouth 4 (four) times daily -  with meals and at bedtime.     SYNTHROID 88 MCG tablet  Generic drug:  levothyroxine  Take 88 mcg by mouth daily before breakfast.     Vitamin D3 1000 UNITS Caps  Take 1,000 Units by mouth daily.         PHYSICAL EXAMINATION  Oncology Vitals 07/24/2015  05/16/2015 05/16/2015 11/05/2014 11/02/2014 11/02/2014 11/02/2014  Height 163 cm - 163 cm 163 cm - - -  Weight 65.363 kg - 65.862 kg 66.361 kg - - -  Weight (lbs) 144 lbs 2 oz - 145 lbs 3 oz 146 lbs 5 oz - - -  BMI (kg/m2) 24.73 kg/m2 - 24.92 kg/m2 25.11 kg/m2 - - -  Temp 96.9 - 98.2 97.8 - 97.6 97.6  Pulse 60 54 113 54 50 45 60  Resp 17 - _0 SpO2 100 - 100 100 99 97 99  BSA (m2) 1.72 m2 - 1.73 m2 1.73 m2 - - -   BP Readings from Last 3 Encounters:  07/24/15 119/75  05/16/15 120/68  11/05/14 129/69    Physical Exam  Constitutional: She is oriented to person, place, and time and well-developed, well-nourished, and in no distress.  HENT:  Head: Normocephalic and atraumatic.  Eyes: Conjunctivae and EOM are normal. Pupils are equal, round, and reactive to light. Right eye exhibits no discharge. Left eye exhibits no discharge. No scleral icterus.  Neck: Normal range of motion.  Pulmonary/Chest: Effort normal. No respiratory distress.  Abdominal: Soft. Bowel sounds are normal. She exhibits no distension and no mass. There is no tenderness. There is no rebound and no guarding.  No flank pain.  Musculoskeletal: Normal range of motion. She exhibits no edema or tenderness.  Neurological: She is alert and oriented to person, place, and time. Gait normal.  Patient observed intermittently popping/clicking with her mouth.  When questioned-patient states that she "has a tic with her mouth that worsens when she is anxious" as baseline.   Skin: Skin is warm and dry. No rash noted. No erythema. No pallor.  Psychiatric: Affect normal.  Nursing note and vitals reviewed.   LABORATORY DATA:. Appointment on 07/24/2015  Component Date Value Ref Range Status  . WBC 07/24/2015 5.7  3.9 - 10.3 10e3/uL Final  . NEUT# 07/24/2015 3.2  1.5 - 6.5 10e3/uL Final  . HGB 07/24/2015 14.7  11.6 - 15.9 g/dL Final  . HCT 07/24/2015 44.8  34.8 - 46.6 % Final  . Platelets 07/24/2015 165  145 - 400 10e3/uL  Final  . MCV 07/24/2015 93.4  79.5 - 101.0 fL Final  . MCH 07/24/2015 30.6  25.1 - 34.0 pg Final  . MCHC 07/24/2015 32.7  31.5 - 36.0 g/dL Final  . RBC 07/24/2015 4.80  3.70 - 5.45 10e6/uL Final  . RDW 07/24/2015 13.1  11.2 - 14.5 % Final  . lymph# 07/24/2015  1.9  0.9 - 3.3 10e3/uL Final  . MONO# 07/24/2015 0.5  0.1 - 0.9 10e3/uL Final  . Eosinophils Absolute 07/24/2015 0.1  0.0 - 0.5 10e3/uL Final  . Basophils Absolute 07/24/2015 0.0  0.0 - 0.1 10e3/uL Final  . NEUT% 07/24/2015 55.9  38.4 - 76.8 % Final  . LYMPH% 07/24/2015 32.9  14.0 - 49.7 % Final  . MONO% 07/24/2015 9.4  0.0 - 14.0 % Final  . EOS% 07/24/2015 1.4  0.0 - 7.0 % Final  . BASO% 07/24/2015 0.4  0.0 - 2.0 % Final  . Sodium 07/24/2015 142  136 - 145 mEq/L Final  . Potassium 07/24/2015 3.7  3.5 - 5.1 mEq/L Final  . Chloride 07/24/2015 106  98 - 109 mEq/L Final  . CO2 07/24/2015 27  22 - 29 mEq/L Final  . Glucose 07/24/2015 103  70 - 140 mg/dl Final  . BUN 07/24/2015 25.1  7.0 - 26.0 mg/dL Final  . Creatinine 07/24/2015 1.0  0.6 - 1.1 mg/dL Final  . Total Bilirubin 07/24/2015 0.66  0.20 - 1.20 mg/dL Final  . Alkaline Phosphatase 07/24/2015 57  40 - 150 U/L Final  . AST 07/24/2015 16  5 - 34 U/L Final  . ALT 07/24/2015 15  0 - 55 U/L Final  . Total Protein 07/24/2015 7.0  6.4 - 8.3 g/dL Final  . Albumin 07/24/2015 3.7  3.5 - 5.0 g/dL Final  . Calcium 07/24/2015 9.3  8.4 - 10.4 mg/dL Final  . Anion Gap 07/24/2015 10  3 - 11 mEq/L Final  . EGFR 07/24/2015 53* >90 ml/min/1.73 m2 Final   eGFR is calculated using the CKD-EPI Creatinine Equation (2009)  . Glucose 07/24/2015 Negative  Negative mg/dL Final  . Bilirubin (Urine) 07/24/2015 Negative  Negative Final  . Ketones 07/24/2015 Negative  Negative mg/dL Final  . Specific Gravity, Urine 07/24/2015 1.020  1.003 - 1.035 Final  . Blood 07/24/2015 Large  Negative Final  . pH 07/24/2015 5.0  4.6 - 8.0 Final  . Protein 07/24/2015 Negative  Negative- <30 mg/dL Final  .  Urobilinogen, UR 07/24/2015 0.2  0.2 - 1 mg/dL Final  . Nitrite 07/24/2015 Negative  Negative Final  . Leukocyte Esterase 07/24/2015 Trace  Negative Final  . RBC / HPF 07/24/2015 7-10  0 - 2 Final  . WBC, UA 07/24/2015 3-6  0 - 2 Final  . Bacteria, UA 07/24/2015 Few  Negative- Trace Final     RADIOGRAPHIC STUDIES: No results found.  ASSESSMENT/PLAN:    Colon cancer 1.stage IIIB (T3, N1c) adenocarcinoma of the ascending colon, grade 3, status post a laparoscopic assisted right colectomy 12/19/2013   20 benign lymph nodes, one soft tissue venous tumor deposit , Crohn's like response   Immunohistochemical testing confirmed loss of an MLH1 and PMS2 expression, BRAF V600E mutation positive 2. sigmoid colon polyp 12/13/2013  3. Tiny nonspecific pulmonary nodules on a chest CT 12/13/2013  4. Microcytic anemia-likely iron deficiency, resolved. 5. History of anxiety/depression  6. status post cholecystectomy 12/30/2011  7. type 2 diabetes   Patient is currently undergoing observation only for her previously diagnosed colon cancer.  Patient's last CEA on 05/16/2015 was 1.5.  Patient continues with complaints of achiness to the right side of her stomach on occasion; her left lateral rib area, and GERD-like symptoms for the past "several years".   Labs obtained today were essentially stable.  However, patient did appear to have a questionable UTI with gross hematuria.  In regards to patient's previous colon  cancer-does appear the patient continues in remission.  Patient was advised to follow-up with her primary care provider in regards to her other chronic complaints that are non--cancer related.    Patient is scheduled for labs and follow up visit on 11/15/2015.  Patient is to call or return for any new or worsening symptoms.    GERD (gastroesophageal reflux disease) Patient reports chronic GERD-like symptoms intermittently.  She states that she occasionally feels burning and  discomfort to her upper central abdomen into her throat.  She states she was taking Prilosec; but discontinued it within the past few weeks.  She denies any active chest pain, chest pressure, shortness of breath, or pain with inspiration.  She also denies any recent fevers or chills.  On discussion with both patient and her husband in regards to possibility that this is GERD.  She was advised to try Prilosec over-the-counter; and will also prescribed Carafate for the patient to try.  Advised patient to follow-up with her primary care provider if symptoms persist or worsen.  She was also advised to go directly to the emergency for any worsening symptoms whatsoever.  UTI (urinary tract infection) Patient reports increased pelvic pressure, urinary frequency, and cloudy urine for the past week.  She denies any flank pain or recent fevers or chills.  Urinalysis obtained today revealed a large amount of blood, nitrite negative, trace leukocyte esterase, WBCs 3-6, and a few bacteria.  Urine culture results pending.  Will prescribe Cipro antibiotics for the patient for probable UTI symptoms; pending urine culture results.  Patient was advised to call/return to go directly to the emergency department for any worsening symptoms whatsoever.  Also, patient was advised she may want to follow-up with her primary care provider if her UTI symptoms do not improve.  She may require further evaluation and management per the urologist if symptoms persist.  Patient stated understanding of all instructions; and was in agreement with this plan of care. The patient knows to call the clinic with any problems, questions or concerns.   This was a shared visit with Dr. Benay Spice today.  Total time spent with patient was 25 minutes;  with greater than 75 percent of that time spent in face to face counseling regarding patient's symptoms,  and coordination of care and follow up.  Disclaimer:This dictation was prepared with  Dragon/digital dictation along with [employer]. Any transcriptional errors that result from this process are unintentional.  Drue Second, NP 07/24/2015   This was a shared visit with Drue Second. Ms. Geathers was interviewed and examined. She will be treated for urinary tract infection. She will follow-up with Dr. Noah Delaine if the urinary symptoms do not improve.  Julieanne Manson, M.D.

## 2015-07-24 NOTE — Assessment & Plan Note (Signed)
Patient reports chronic GERD-like symptoms intermittently.  She states that she occasionally feels burning and discomfort to her upper central abdomen into her throat.  She states she was taking Prilosec; but discontinued it within the past few weeks.  She denies any active chest pain, chest pressure, shortness of breath, or pain with inspiration.  She also denies any recent fevers or chills.  On discussion with both patient and her husband in regards to possibility that this is GERD.  She was advised to try Prilosec over-the-counter; and will also prescribed Carafate for the patient to try.  Advised patient to follow-up with her primary care provider if symptoms persist or worsen.  She was also advised to go directly to the emergency for any worsening symptoms whatsoever.

## 2015-07-24 NOTE — Assessment & Plan Note (Signed)
Patient reports increased pelvic pressure, urinary frequency, and cloudy urine for the past week.  She denies any flank pain or recent fevers or chills.  Urinalysis obtained today revealed a large amount of blood, nitrite negative, trace leukocyte esterase, WBCs 3-6, and a few bacteria.  Urine culture results pending.  Will prescribe Cipro antibiotics for the patient for probable UTI symptoms; pending urine culture results.  Patient was advised to call/return to go directly to the emergency department for any worsening symptoms whatsoever.  Also, patient was advised she may want to follow-up with her primary care provider if her UTI symptoms do not improve.  She may require further evaluation and management per the urologist if symptoms persist.

## 2015-07-25 LAB — URINE CULTURE

## 2015-07-26 ENCOUNTER — Telehealth: Payer: Self-pay | Admitting: *Deleted

## 2015-07-26 NOTE — Telephone Encounter (Signed)
TC to pt to check status- she is starting to feel better with her urinary complaints- pt states the abx sometimes causes her nausea. Advised pt to take medication with food. She asked about taking nausea medication- advised pt it was ok to take as instructed previously. Pt denies any other questions or concerns. She will give [place] a call if she is not improving further.

## 2015-08-07 DIAGNOSIS — N390 Urinary tract infection, site not specified: Secondary | ICD-10-CM | POA: Diagnosis not present

## 2015-08-07 DIAGNOSIS — N760 Acute vaginitis: Secondary | ICD-10-CM | POA: Diagnosis not present

## 2015-08-20 ENCOUNTER — Other Ambulatory Visit: Payer: Self-pay | Admitting: Nurse Practitioner

## 2015-09-15 ENCOUNTER — Telehealth: Payer: Self-pay | Admitting: Oncology

## 2015-09-15 NOTE — Telephone Encounter (Signed)
lvm for pt regarding to 12.16 appt moved to 12.12 due to md on pal....mailed pt appt sched/avs and letter

## 2015-09-19 DIAGNOSIS — R319 Hematuria, unspecified: Secondary | ICD-10-CM | POA: Diagnosis not present

## 2015-09-19 DIAGNOSIS — Z1272 Encounter for screening for malignant neoplasm of vagina: Secondary | ICD-10-CM | POA: Diagnosis not present

## 2015-09-19 DIAGNOSIS — N760 Acute vaginitis: Secondary | ICD-10-CM | POA: Diagnosis not present

## 2015-09-19 DIAGNOSIS — R898 Other abnormal findings in specimens from other organs, systems and tissues: Secondary | ICD-10-CM | POA: Diagnosis not present

## 2015-09-19 DIAGNOSIS — Z6824 Body mass index (BMI) 24.0-24.9, adult: Secondary | ICD-10-CM | POA: Diagnosis not present

## 2015-09-19 DIAGNOSIS — Z1231 Encounter for screening mammogram for malignant neoplasm of breast: Secondary | ICD-10-CM | POA: Diagnosis not present

## 2015-09-19 DIAGNOSIS — Z01419 Encounter for gynecological examination (general) (routine) without abnormal findings: Secondary | ICD-10-CM | POA: Diagnosis not present

## 2015-10-23 DIAGNOSIS — L989 Disorder of the skin and subcutaneous tissue, unspecified: Secondary | ICD-10-CM | POA: Diagnosis not present

## 2015-11-11 ENCOUNTER — Other Ambulatory Visit (HOSPITAL_BASED_OUTPATIENT_CLINIC_OR_DEPARTMENT_OTHER): Payer: Medicare Other

## 2015-11-11 ENCOUNTER — Telehealth: Payer: Self-pay | Admitting: Oncology

## 2015-11-11 ENCOUNTER — Ambulatory Visit (HOSPITAL_BASED_OUTPATIENT_CLINIC_OR_DEPARTMENT_OTHER): Payer: Medicare Other | Admitting: Oncology

## 2015-11-11 VITALS — BP 134/60 | HR 58 | Temp 97.8°F | Resp 18 | Ht 64.0 in | Wt 146.1 lb

## 2015-11-11 DIAGNOSIS — C182 Malignant neoplasm of ascending colon: Secondary | ICD-10-CM

## 2015-11-11 DIAGNOSIS — C189 Malignant neoplasm of colon, unspecified: Secondary | ICD-10-CM | POA: Diagnosis not present

## 2015-11-11 LAB — CEA: CEA: 1.5 ng/mL (ref 0.0–5.0)

## 2015-11-11 NOTE — Telephone Encounter (Signed)
Gave and printed appt sched and avs for pt for June 2017 °

## 2015-11-11 NOTE — Progress Notes (Signed)
  [facility] OFFICE PROGRESS NOTE   Diagnosis: Colon cancer  INTERVAL HISTORY:   [name] returns as scheduled. Good appetite. She has occasional solid dysphagia. She has intermittent discomfort in various sites including the head, chest, and abdomen.  Objective:  Vital signs in last 24 hours:  Blood pressure 134/60, pulse 58, temperature 97.8 F (36.6 C), temperature source Oral, resp. rate 18, height _0  (1.626 m), weight 146 lb 1.6 oz (66.271 kg), SpO2 100 %.    HEENT: Neck without mass  Lymphatics: No cervical, supra-clavicular, axillary, or inguinal nodes Resp: Lungs clear bilaterally Cardio: Regular rate and rhythm GI: No hepatosplenomegaly, no mass, nontender Vascular: No leg edema   Medications: I have reviewed the patient's current medications.  Assessment/Plan: 1. stage IIIB (T3, N1c) adenocarcinoma of the ascending colon, grade 3, status post a laparoscopic assisted right colectomy 12/19/2013   20 benign lymph nodes, one soft tissue venous tumor deposit , Crohn's like response   Immunohistochemical testing confirmed loss of an MLH1 and PMS2 expression, BRAF V600E mutation positive 2. sigmoid colon polyp 12/13/2013  3. Tiny nonspecific pulmonary nodules on a chest CT 12/13/2013  4. Microcytic anemia-likely iron deficiency, resolved. 5. History of anxiety/depression  6. status post cholecystectomy 12/30/2011  7. type 2 diabetes      Disposition:  She remains in clinical remission from colon cancer. We will follow-up on the CEA from today. Anne [name] will return for an office visit in 6 months.  Betsy Coder, MD  11/11/2015  11:19 AM

## 2015-11-12 ENCOUNTER — Telehealth: Payer: Self-pay | Admitting: *Deleted

## 2015-11-12 NOTE — Telephone Encounter (Signed)
-----   Message from Ladell Pier, MD sent at 11/11/2015  5:37 PM EST ----- Please call patient, cea is normal

## 2015-11-12 NOTE — Telephone Encounter (Signed)
Left message for pt to call office for normal lab result. CEA 1.5.

## 2015-11-15 ENCOUNTER — Other Ambulatory Visit: Payer: Medicare Other

## 2015-11-15 ENCOUNTER — Ambulatory Visit: Payer: Medicare Other | Admitting: Oncology

## 2015-12-06 DIAGNOSIS — N183 Chronic kidney disease, stage 3 (moderate): Secondary | ICD-10-CM | POA: Diagnosis not present

## 2015-12-06 DIAGNOSIS — E1122 Type 2 diabetes mellitus with diabetic chronic kidney disease: Secondary | ICD-10-CM | POA: Diagnosis not present

## 2015-12-06 DIAGNOSIS — E559 Vitamin D deficiency, unspecified: Secondary | ICD-10-CM | POA: Diagnosis not present

## 2015-12-06 DIAGNOSIS — E785 Hyperlipidemia, unspecified: Secondary | ICD-10-CM | POA: Diagnosis not present

## 2016-01-06 DIAGNOSIS — H6122 Impacted cerumen, left ear: Secondary | ICD-10-CM | POA: Diagnosis not present

## 2016-01-14 DIAGNOSIS — E039 Hypothyroidism, unspecified: Secondary | ICD-10-CM | POA: Diagnosis not present

## 2016-01-14 DIAGNOSIS — N183 Chronic kidney disease, stage 3 (moderate): Secondary | ICD-10-CM | POA: Diagnosis not present

## 2016-01-14 DIAGNOSIS — E1122 Type 2 diabetes mellitus with diabetic chronic kidney disease: Secondary | ICD-10-CM | POA: Diagnosis not present

## 2016-01-14 DIAGNOSIS — E785 Hyperlipidemia, unspecified: Secondary | ICD-10-CM | POA: Diagnosis not present

## 2016-02-06 ENCOUNTER — Other Ambulatory Visit: Payer: Self-pay | Admitting: Gastroenterology

## 2016-02-06 DIAGNOSIS — R1310 Dysphagia, unspecified: Secondary | ICD-10-CM | POA: Diagnosis not present

## 2016-02-06 DIAGNOSIS — K5900 Constipation, unspecified: Secondary | ICD-10-CM | POA: Diagnosis not present

## 2016-02-06 DIAGNOSIS — Z85038 Personal history of other malignant neoplasm of large intestine: Secondary | ICD-10-CM | POA: Diagnosis not present

## 2016-02-06 DIAGNOSIS — K219 Gastro-esophageal reflux disease without esophagitis: Secondary | ICD-10-CM | POA: Diagnosis not present

## 2016-02-07 ENCOUNTER — Other Ambulatory Visit: Payer: Self-pay

## 2016-02-13 ENCOUNTER — Ambulatory Visit
Admission: RE | Admit: 2016-02-13 | Discharge: 2016-02-13 | Disposition: A | Discharge status: Home / Self Care | Payer: Medicare Other | Source: Ambulatory Visit | Attending: Gastroenterology | Admitting: Gastroenterology

## 2016-02-13 DIAGNOSIS — R1310 Dysphagia, unspecified: Secondary | ICD-10-CM | POA: Diagnosis not present

## 2016-03-03 DIAGNOSIS — N760 Acute vaginitis: Secondary | ICD-10-CM | POA: Diagnosis not present

## 2016-03-03 DIAGNOSIS — N390 Urinary tract infection, site not specified: Secondary | ICD-10-CM | POA: Diagnosis not present

## 2016-03-12 DIAGNOSIS — N760 Acute vaginitis: Secondary | ICD-10-CM | POA: Diagnosis not present

## 2016-03-12 DIAGNOSIS — N390 Urinary tract infection, site not specified: Secondary | ICD-10-CM | POA: Diagnosis not present

## 2016-04-23 DIAGNOSIS — Z85038 Personal history of other malignant neoplasm of large intestine: Secondary | ICD-10-CM | POA: Diagnosis not present

## 2016-05-15 ENCOUNTER — Telehealth: Payer: Self-pay | Admitting: Oncology

## 2016-05-15 ENCOUNTER — Other Ambulatory Visit (HOSPITAL_BASED_OUTPATIENT_CLINIC_OR_DEPARTMENT_OTHER): Payer: Medicare Other

## 2016-05-15 ENCOUNTER — Ambulatory Visit (HOSPITAL_BASED_OUTPATIENT_CLINIC_OR_DEPARTMENT_OTHER): Payer: Medicare Other | Admitting: Oncology

## 2016-05-15 VITALS — BP 144/82 | HR 56 | Temp 98.1°F | Resp 20 | Ht 64.0 in | Wt 143.5 lb

## 2016-05-15 DIAGNOSIS — R1310 Dysphagia, unspecified: Secondary | ICD-10-CM | POA: Diagnosis not present

## 2016-05-15 DIAGNOSIS — D649 Anemia, unspecified: Secondary | ICD-10-CM | POA: Diagnosis not present

## 2016-05-15 DIAGNOSIS — C182 Malignant neoplasm of ascending colon: Secondary | ICD-10-CM

## 2016-05-15 DIAGNOSIS — K5900 Constipation, unspecified: Secondary | ICD-10-CM | POA: Diagnosis not present

## 2016-05-15 NOTE — Progress Notes (Signed)
  [facility] OFFICE PROGRESS NOTE   Diagnosis: Colon cancer  INTERVAL HISTORY:   Anne [name] returns as scheduled. She has chest discomfort when she is anxious. She has intermittent constipation. She continues to report difficulty swallowing.  Objective:  Vital signs in last 24 hours:  Blood pressure 144/82, pulse 56, temperature 98.1 F (36.7 C), temperature source Oral, resp. rate 20, height _0  (1.626 m), weight 143 lb 8 oz (65.091 kg), SpO2 99 %.    HEENT: Neck without mass Lymphatics: No cervical, supraclavicular, axillary, or inguinal nodes Resp: Lungs clear bilaterally Cardio: Regular rate and rhythm GI: No hepatosplenomegaly, no mass, nontender Vascular: No leg edemaeuro:     Lab Results:   CEA pending  Medications: I have reviewed the patient's current medications.  Assessment/Plan: 1. stage IIIB (T3, N1c) adenocarcinoma of the ascending colon, grade 3, status post a laparoscopic assisted right colectomy 12/19/2013   20 benign lymph nodes, one soft tissue venous tumor deposit , Crohn's like response   1Immunohistochemical testing confirmed loss of an MLH1 and PMS2 expression, BRAF V600E mutation positive 2. sigmoid colon polyp 12/13/2013  3. Tiny nonspecific pulmonary nodules on a chest CT 12/13/2013  4. Microcytic anemia-likely iron deficiency, resolved. 5. History of anxiety/depression  6. status post cholecystectomy 12/30/2011  7. type 2 diabetes    Disposition:   She remains in clinical remission from colon cancer. We will follow-up on the CEA from today. She will return for an office visit and CEA in 6 months.  Anne Coder, MD  05/15/2016  12:20 PM

## 2016-05-15 NOTE — Telephone Encounter (Signed)
Gave and printed appts ched and avs for pt for DEC  °

## 2016-05-16 LAB — CEA: CEA: 2.6 ng/mL (ref 0.0–4.7)

## 2016-05-16 LAB — CEA (PARALLEL TESTING): CEA: 1.2 ng/mL

## 2016-05-18 ENCOUNTER — Telehealth: Payer: Self-pay | Admitting: *Deleted

## 2016-05-18 NOTE — Telephone Encounter (Signed)
Message from pt's husband requesting CEA result. Informed him of normal result, he voiced understanding.

## 2016-05-21 ENCOUNTER — Encounter: Payer: Self-pay | Admitting: *Deleted

## 2016-05-21 NOTE — Progress Notes (Signed)
Colonoscopy report received via fax from Dr. Lorie Apley office.

## 2016-05-21 NOTE — Progress Notes (Signed)
Call placed to Dr. Lorie Apley office to receive most current colonoscopy report.

## 2016-07-09 DIAGNOSIS — E1122 Type 2 diabetes mellitus with diabetic chronic kidney disease: Secondary | ICD-10-CM | POA: Diagnosis not present

## 2016-07-09 DIAGNOSIS — Z23 Encounter for immunization: Secondary | ICD-10-CM | POA: Diagnosis not present

## 2016-07-09 DIAGNOSIS — E559 Vitamin D deficiency, unspecified: Secondary | ICD-10-CM | POA: Diagnosis not present

## 2016-07-09 DIAGNOSIS — Z1389 Encounter for screening for other disorder: Secondary | ICD-10-CM | POA: Diagnosis not present

## 2016-07-09 DIAGNOSIS — Z0000 Encounter for general adult medical examination without abnormal findings: Secondary | ICD-10-CM | POA: Diagnosis not present

## 2016-07-09 DIAGNOSIS — E785 Hyperlipidemia, unspecified: Secondary | ICD-10-CM | POA: Diagnosis not present

## 2016-07-16 DIAGNOSIS — E1122 Type 2 diabetes mellitus with diabetic chronic kidney disease: Secondary | ICD-10-CM | POA: Diagnosis not present

## 2016-07-16 DIAGNOSIS — I700 Atherosclerosis of aorta: Secondary | ICD-10-CM | POA: Diagnosis not present

## 2016-07-16 DIAGNOSIS — N183 Chronic kidney disease, stage 3 (moderate): Secondary | ICD-10-CM | POA: Diagnosis not present

## 2016-07-16 DIAGNOSIS — E785 Hyperlipidemia, unspecified: Secondary | ICD-10-CM | POA: Diagnosis not present

## 2016-07-16 DIAGNOSIS — H00022 Hordeolum internum right lower eyelid: Secondary | ICD-10-CM | POA: Diagnosis not present

## 2016-07-30 DIAGNOSIS — E119 Type 2 diabetes mellitus without complications: Secondary | ICD-10-CM | POA: Diagnosis not present

## 2016-09-21 DIAGNOSIS — Z01419 Encounter for gynecological examination (general) (routine) without abnormal findings: Secondary | ICD-10-CM | POA: Diagnosis not present

## 2016-09-21 DIAGNOSIS — Z1231 Encounter for screening mammogram for malignant neoplasm of breast: Secondary | ICD-10-CM | POA: Diagnosis not present

## 2016-11-09 DIAGNOSIS — N390 Urinary tract infection, site not specified: Secondary | ICD-10-CM | POA: Diagnosis not present

## 2016-11-12 ENCOUNTER — Telehealth: Payer: Self-pay | Admitting: Oncology

## 2016-11-12 ENCOUNTER — Ambulatory Visit (HOSPITAL_BASED_OUTPATIENT_CLINIC_OR_DEPARTMENT_OTHER): Payer: Medicare Other | Admitting: Oncology

## 2016-11-12 ENCOUNTER — Other Ambulatory Visit (HOSPITAL_BASED_OUTPATIENT_CLINIC_OR_DEPARTMENT_OTHER): Payer: Medicare Other

## 2016-11-12 VITALS — BP 122/69 | HR 64 | Temp 98.0°F | Resp 17 | Wt 147.2 lb

## 2016-11-12 DIAGNOSIS — C182 Malignant neoplasm of ascending colon: Secondary | ICD-10-CM

## 2016-11-12 DIAGNOSIS — E119 Type 2 diabetes mellitus without complications: Secondary | ICD-10-CM | POA: Diagnosis not present

## 2016-11-12 DIAGNOSIS — K5900 Constipation, unspecified: Secondary | ICD-10-CM

## 2016-11-12 LAB — CEA (IN HOUSE-CHCC): CEA (CHCC-In House): 2.87 ng/mL (ref 0.00–5.00)

## 2016-11-12 NOTE — Progress Notes (Signed)
  [facility] OFFICE PROGRESS NOTE   Diagnosis: Colon cancer  INTERVAL HISTORY:  Ms. Bultman returns as scheduled. She took a trip to [place] this fall. Her husband reports that she is feeling well. She complains of constipation. She is currently being treated for a urinary tract infection. Her symptoms have improved.  Objective:  Vital signs in last 24 hours:  Blood pressure 122/69, pulse 64, temperature 98 F (36.7 C), temperature source Oral, resp. rate 17, weight 147 lb 3.2 oz (66.8 kg), SpO2 100 %.    HEENT: Neck without mass Lymphatics: No cervical, supraclavicular, axillary, or inguinal nodes Resp: Lungs clear bilaterally Cardio: Regular rate and rhythm GI: No hepatosplenomegaly, no mass, nontender Vascular: No leg edema   Lab Results:  CEA pending  Medications: I have reviewed the patient's current medications.  Assessment/Plan: 1. 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 sigmoid colon polyp 12/13/2013  3. Tiny nonspecific pulmonary nodules on a chest CT 12/13/2013  4. Microcytic anemia-likely iron deficiency, resolved. 5. History of anxiety/depression  6. status post cholecystectomy 12/30/2011  7. type 2 diabetes     Disposition:  She remains in clinical remission from colon cancer. She would like to continue every 6 month follow-up at the [facility]. We will follow-up on the CEA from today. She reports last undergoing a colonoscopy in 2016. A three-year follow-up was planned. She can discuss the indication for colonoscopy surveillance with Dr. Collene Mares.  Betsy Coder, MD  11/12/2016  12:17 PM

## 2016-11-12 NOTE — Progress Notes (Signed)
error 

## 2016-11-12 NOTE — Telephone Encounter (Signed)
Appointments scheduled per 11/12/16 los. Patient was given a copy of the AVS report and appointment schedule, per 11/12/16 los. °

## 2016-11-13 ENCOUNTER — Telehealth: Payer: Self-pay | Admitting: *Deleted

## 2016-11-13 LAB — CEA: CEA1: 3.2 ng/mL (ref 0.0–4.7)

## 2016-11-13 NOTE — Telephone Encounter (Signed)
-----   Message from Anne Pier, MD sent at 11/13/2016  7:37 AM EST ----- Please call patient, cea is normal, f/u as scheduled

## 2016-11-13 NOTE — Telephone Encounter (Signed)
Spoke with pt's husband. Informed him of normal CEA result. He voiced understanding.

## 2017-01-01 DIAGNOSIS — J029 Acute pharyngitis, unspecified: Secondary | ICD-10-CM | POA: Diagnosis not present

## 2017-01-01 DIAGNOSIS — J159 Unspecified bacterial pneumonia: Secondary | ICD-10-CM | POA: Diagnosis not present

## 2017-01-01 DIAGNOSIS — R6889 Other general symptoms and signs: Secondary | ICD-10-CM | POA: Diagnosis not present

## 2017-01-03 IMAGING — RF DG ESOPHAGUS
9 of 11 series · 19 of 24 positions shown · non-contrast
Comparison: None.

CLINICAL DATA: Dysphagia

EXAM:
ESOPHOGRAM/BARIUM SWALLOW
TECHNIQUE: Single contrast examination was performed using  thin Pahernik.
FLUOROSCOPY TIME:  Radiation Exposure Index (as provided by the
fluoroscopic device): 37-is a grade per square cm
If the device does not provide the exposure index:
Fluoroscopy Time:  1 minutes 32 seconds
Number of Acquired Images:

[Series 1: run · 6 of 10 slices shown (1 of 9)]
[im 1/10]
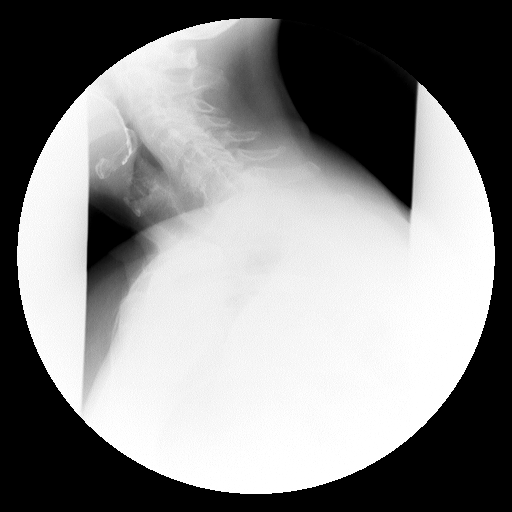
[im 2/10]
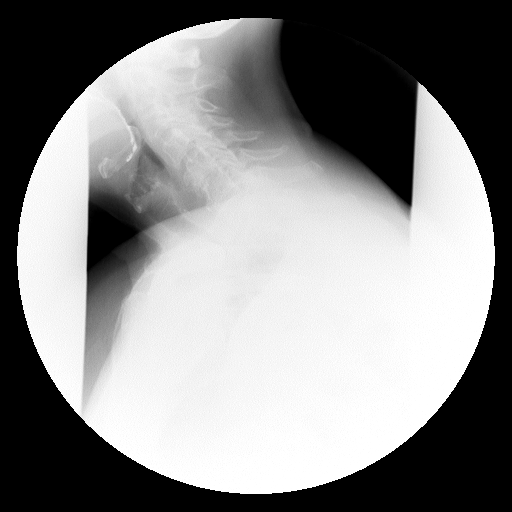
[im 5/10]
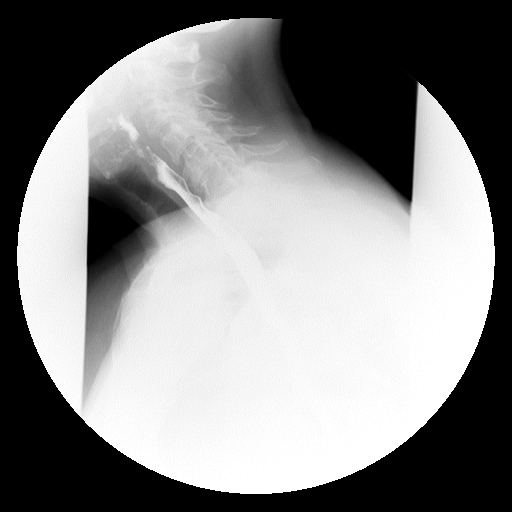
[im 7/10]
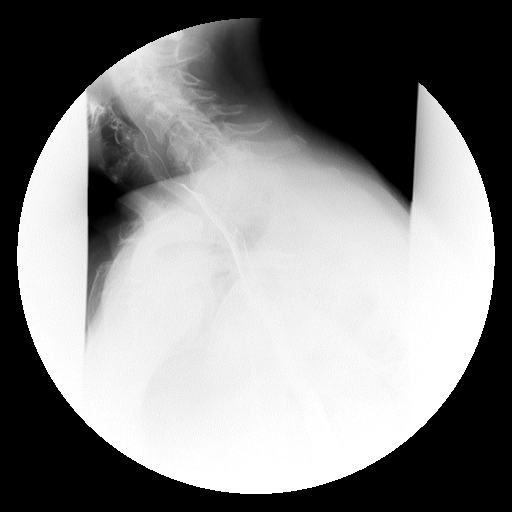
[im 8/10]
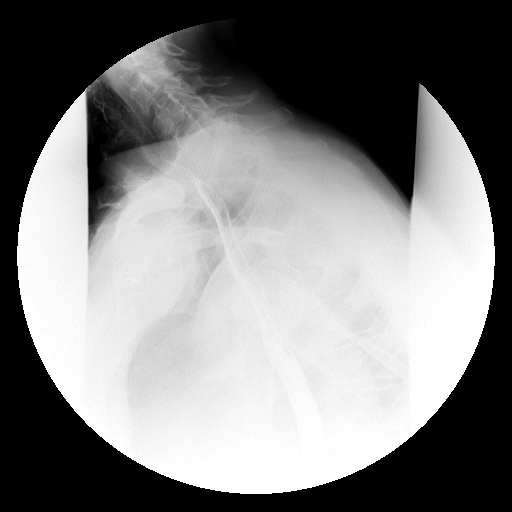
[im 10/10]
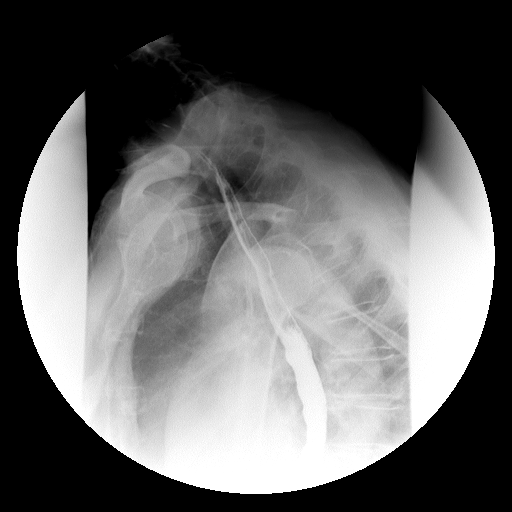

[Series 2: run · 6 of 12 slices shown (2 of 9)]
[im 2/12]
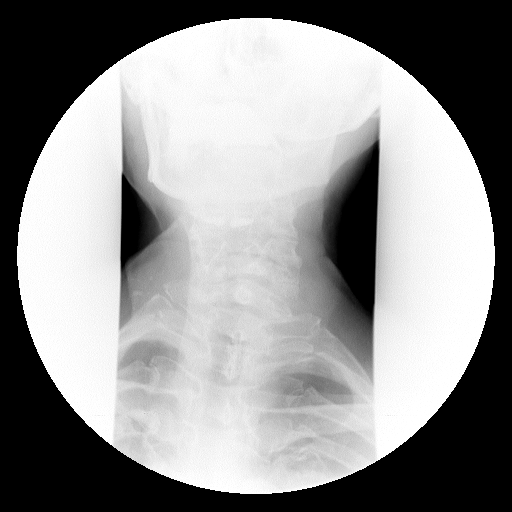
[im 4/12]
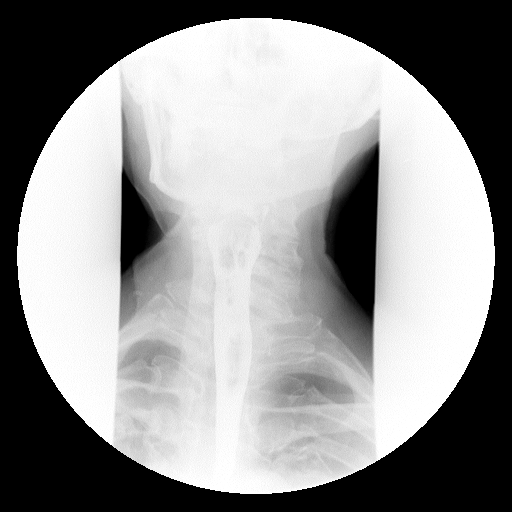
[im 5/12]
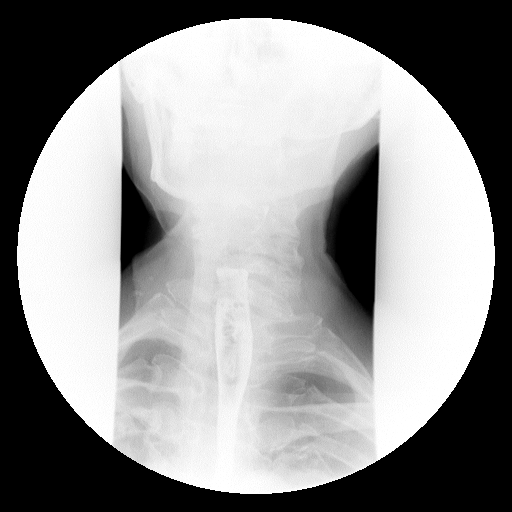
[im 8/12]
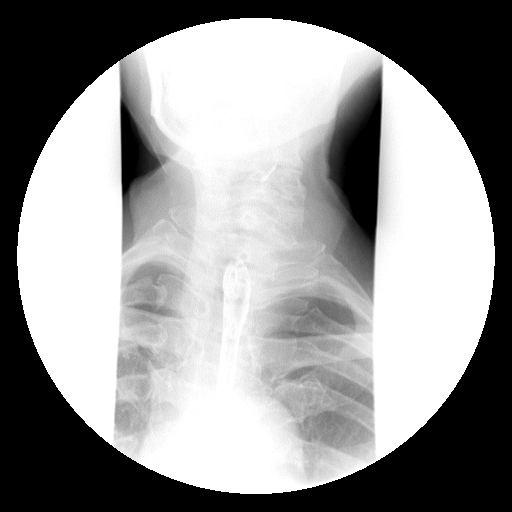
[im 10/12]
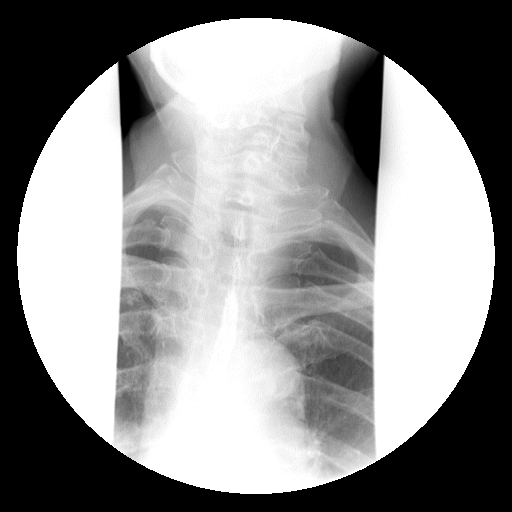
[im 12/12]
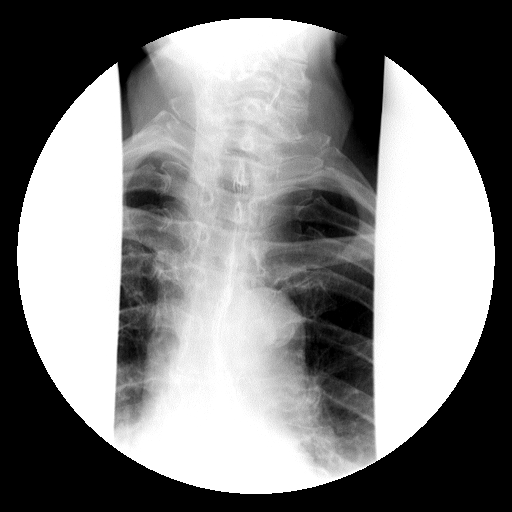

[Series 3: run · 1 of 1 slices shown (3 of 9)]
[im 1/1]
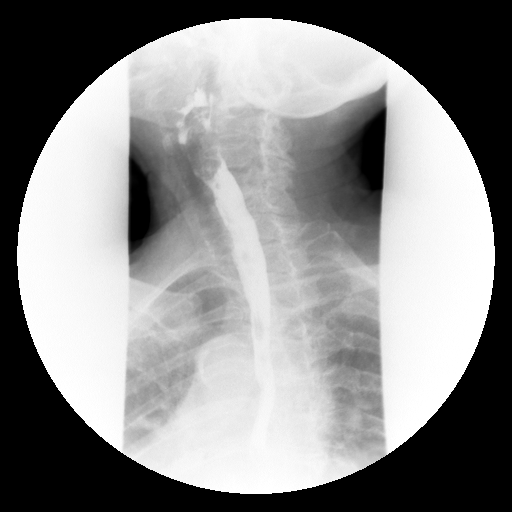

[Series 5: run · 1 of 1 slices shown (4 of 9)]
[im 1/1]
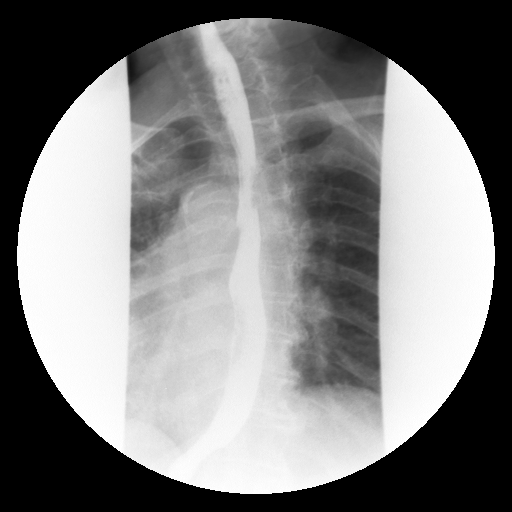

[Series 6: run · 1 of 1 slices shown (5 of 9)]
[im 1/1]
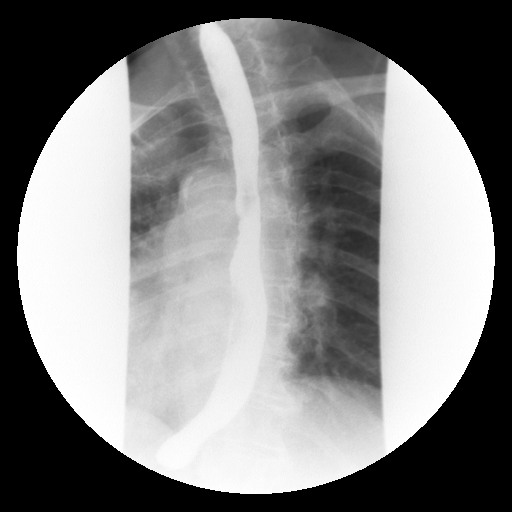

[Series 7: run · 1 of 1 slices shown (6 of 9)]
[im 1/1]
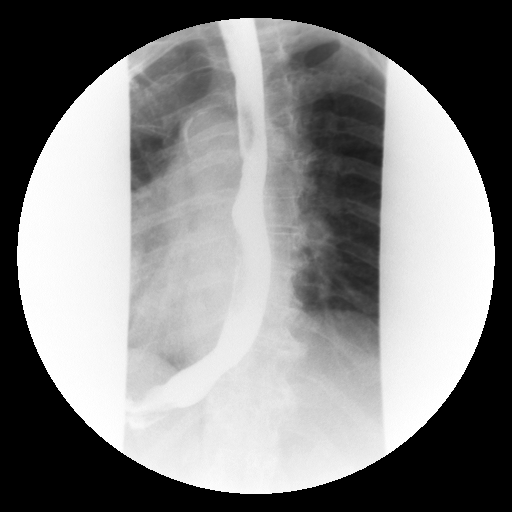

[Series 8: run · 1 of 1 slices shown (7 of 9)]
[im 1/1]
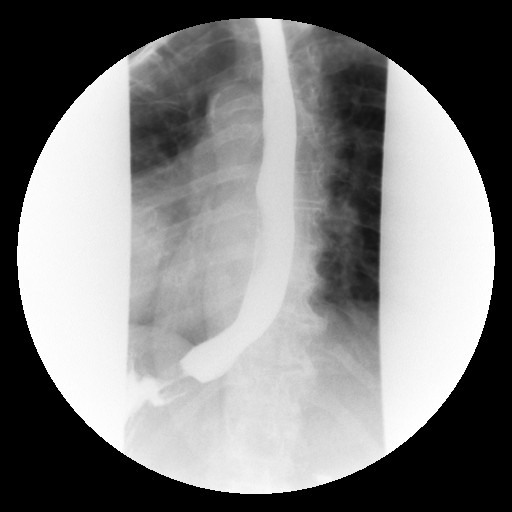

[Series 10: run · 1 of 1 slices shown (8 of 9)]
[im 1/1]
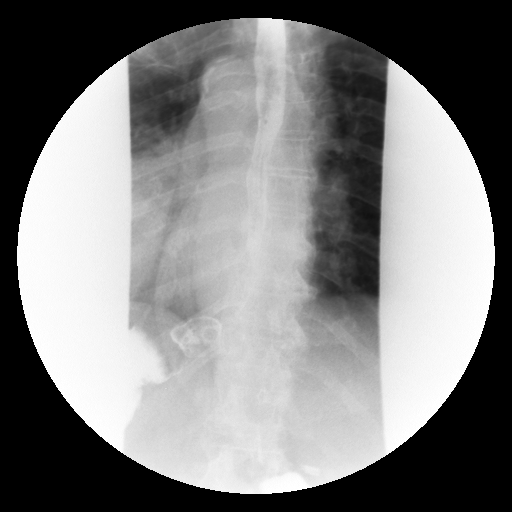

[Series 11: run · 1 of 1 slices shown (9 of 9)]
[im 1/1]
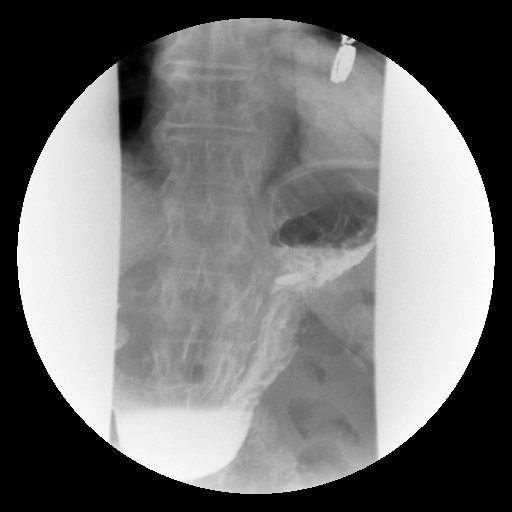

[19 of 24 positions shown; findings below may reference images not displayed]

FINDINGS: A single contrast barium swallow was performed. Rapid sequence spot
films of the cervical esophagus show no evidence of aspiration or
penetration. The swallowing mechanism is unremarkable. Mild pooling
is noted in the vallecula. Minimal tertiary contractions are present
in the distal esophagus. No hiatal hernia is seen. No
gastroesophageal reflux could be elicited. A barium pill was given
at the end of the study which passed into the stomach without delay.
IMPRESSION: Negative single-contrast barium swallow. Mild pooling of barium is
noted in the vallecula.

## 2017-01-04 DIAGNOSIS — R110 Nausea: Secondary | ICD-10-CM | POA: Diagnosis not present

## 2017-01-11 DIAGNOSIS — E559 Vitamin D deficiency, unspecified: Secondary | ICD-10-CM | POA: Diagnosis not present

## 2017-01-11 DIAGNOSIS — E1122 Type 2 diabetes mellitus with diabetic chronic kidney disease: Secondary | ICD-10-CM | POA: Diagnosis not present

## 2017-01-11 DIAGNOSIS — E785 Hyperlipidemia, unspecified: Secondary | ICD-10-CM | POA: Diagnosis not present

## 2017-01-18 DIAGNOSIS — I700 Atherosclerosis of aorta: Secondary | ICD-10-CM | POA: Diagnosis not present

## 2017-01-18 DIAGNOSIS — E1122 Type 2 diabetes mellitus with diabetic chronic kidney disease: Secondary | ICD-10-CM | POA: Diagnosis not present

## 2017-01-18 DIAGNOSIS — E785 Hyperlipidemia, unspecified: Secondary | ICD-10-CM | POA: Diagnosis not present

## 2017-01-18 DIAGNOSIS — N183 Chronic kidney disease, stage 3 (moderate): Secondary | ICD-10-CM | POA: Diagnosis not present

## 2017-01-18 DIAGNOSIS — R05 Cough: Secondary | ICD-10-CM | POA: Diagnosis not present

## 2017-01-18 DIAGNOSIS — R911 Solitary pulmonary nodule: Secondary | ICD-10-CM | POA: Diagnosis not present

## 2017-04-28 DIAGNOSIS — Z85038 Personal history of other malignant neoplasm of large intestine: Secondary | ICD-10-CM | POA: Diagnosis not present

## 2017-05-13 ENCOUNTER — Ambulatory Visit (HOSPITAL_BASED_OUTPATIENT_CLINIC_OR_DEPARTMENT_OTHER): Payer: Medicare Other | Admitting: Oncology

## 2017-05-13 ENCOUNTER — Other Ambulatory Visit (HOSPITAL_BASED_OUTPATIENT_CLINIC_OR_DEPARTMENT_OTHER): Payer: Medicare Other

## 2017-05-13 VITALS — BP 126/75 | HR 58 | Temp 97.9°F | Resp 18 | Ht 64.0 in | Wt 145.0 lb

## 2017-05-13 DIAGNOSIS — K5900 Constipation, unspecified: Secondary | ICD-10-CM | POA: Diagnosis not present

## 2017-05-13 DIAGNOSIS — E119 Type 2 diabetes mellitus without complications: Secondary | ICD-10-CM

## 2017-05-13 DIAGNOSIS — C182 Malignant neoplasm of ascending colon: Secondary | ICD-10-CM

## 2017-05-13 LAB — CEA (IN HOUSE-CHCC): CEA (CHCC-IN HOUSE): 3.2 ng/mL (ref 0.00–5.00)

## 2017-05-13 NOTE — Progress Notes (Signed)
  [facility] OFFICE PROGRESS NOTE   Diagnosis: Colon cancer  INTERVAL HISTORY:   Ms. Fogarty returns as scheduled. She has multiple complaints. She has intermittent constipation. No bleeding. She reports a poor appetite, but she is eating. She feels "dizzy "in the mornings and has left-sided chest discomfort that resolves with Xanax. She has pain in her feet with prolonged standing. Objective:  Vital signs in last 24 hours:  Blood pressure 126/75, pulse (!) 58, temperature 97.9 F (36.6 C), temperature source Oral, resp. rate 18, height '5\' 4"'$  (1.626 m), weight 145 lb (65.8 kg).    HEENT: Neck without mass Lymphatics: No cervical, supraclavicular, axillary, or inguinal nodes Resp: Lungs with end inspiratory rhonchi at the left base, no respiratory distress Cardio: Regular rate and rhythm GI: No hepatosplenomegaly, no mass, mild tenderness in the right lower abdomen Vascular: No leg edema   Lab Results:   Lab Results  Component Value Date   CEA1 3.20 05/13/2017     Medications: I have reviewed the patient's current medications.  Assessment/Plan: 1. 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 sigmoid colon polyp 12/13/2013  3. Tiny nonspecific pulmonary nodules on a chest CT 12/13/2013  4. Microcytic anemia-likely iron deficiency, resolved. 5. History of anxiety/depression  6. status post cholecystectomy 12/30/2011  7. type 2 diabetes     Disposition:  She remains in clinical remission from colon cancer. We will follow-up on the CEA from today. She would like to continue clinical follow-up at [facility].  She will discuss the indication for another colonoscopy with Dr. Collene Mares.  She is not scheduled for a follow-up appointment at the [facility]. I am available to see her in the future as needed.  15 minutes were spent with the patient today. The majority of the time was used for counseling and coordination of care.  Donneta Romberg, MD  05/13/2017  1:49 PM

## 2017-05-14 ENCOUNTER — Telehealth: Payer: Self-pay | Admitting: *Deleted

## 2017-05-14 LAB — CEA: CEA: 3.3 ng/mL (ref 0.0–4.7)

## 2017-05-14 NOTE — Telephone Encounter (Signed)
-----   Message from Ladell Pier, MD sent at 05/14/2017  6:42 AM EDT ----- Please call patient, cea is normal

## 2017-05-14 NOTE — Telephone Encounter (Signed)
Notified pt of normal CEA. She voiced appreciation for call. 

## 2017-05-17 ENCOUNTER — Telehealth: Payer: Self-pay | Admitting: Cardiology

## 2017-05-17 DIAGNOSIS — R072 Precordial pain: Secondary | ICD-10-CM

## 2017-05-17 NOTE — Telephone Encounter (Signed)
That's ok.

## 2017-05-17 NOTE — Telephone Encounter (Signed)
Pt gave verbal permission to speak with husband.  Husband states he was here this past Friday seeing Dr. Meda Coffee and they mentioned pt's chest discomfort and low HR to her.  Husband states Dr. Meda Coffee said pt should come in to see her soon and possibly have a monitor placed.  Advised Dr. Meda Coffee and her nurse are out of the office today but I will send message to Dr. Meda Coffee to see if she would like to go ahead and schedule pt to have monitor, if so, event or holter?

## 2017-05-17 NOTE — Telephone Encounter (Signed)
Pt's husband calling to say while he was seeing Dr. Meda Coffee 05-14-17 , his wife was complaining of chest discomfort for 2-3 months, per husband Dr. Meda Coffee said she needed an appt with her right away and need a heart monitor-nothing "right away" with  Meda Coffee, does she want to work her in or just order monitor

## 2017-05-18 DIAGNOSIS — R079 Chest pain, unspecified: Secondary | ICD-10-CM | POA: Insufficient documentation

## 2017-05-18 NOTE — Telephone Encounter (Signed)
48 hour holter, thank you

## 2017-05-18 NOTE — Telephone Encounter (Signed)
Pt is scheduled for her 48 hour holter monitor on 7/2 and follow-up appt with Anne Husk PA-C on 8/20 at 0945.  Pt made aware of appt date and times by [facility] scheduling.

## 2017-05-18 NOTE — Telephone Encounter (Signed)
Notified the pt and spouse that per Dr Meda Coffee, we will order for the pt to have a 48 hour holter monitor placed for chest pain, and schedule her to see Dr Meda Coffee at her next available appt.  Informed both parties that I will place the order in the system, and have a [facility] scheduler call them back to arrange 48 hour holter monitor appt, and an appt with Dr Meda Coffee at her next available.  Both verbalized understanding and agrees with this plan.

## 2017-05-18 NOTE — Telephone Encounter (Signed)
Dr Meda Coffee, do you want an event monitor or regular holter monitor for this pt?  This is a new pt you must have talked to. Schedule new pt appt with APP?

## 2017-05-24 DIAGNOSIS — N390 Urinary tract infection, site not specified: Secondary | ICD-10-CM | POA: Diagnosis not present

## 2017-05-31 ENCOUNTER — Ambulatory Visit (INDEPENDENT_AMBULATORY_CARE_PROVIDER_SITE_OTHER): Payer: Medicare Other

## 2017-05-31 ENCOUNTER — Other Ambulatory Visit: Payer: Self-pay | Admitting: Cardiology

## 2017-05-31 DIAGNOSIS — R001 Bradycardia, unspecified: Secondary | ICD-10-CM

## 2017-05-31 DIAGNOSIS — R072 Precordial pain: Secondary | ICD-10-CM

## 2017-05-31 DIAGNOSIS — R079 Chest pain, unspecified: Secondary | ICD-10-CM | POA: Diagnosis not present

## 2017-06-16 ENCOUNTER — Ambulatory Visit (INDEPENDENT_AMBULATORY_CARE_PROVIDER_SITE_OTHER): Payer: Medicare Other | Admitting: Internal Medicine

## 2017-06-16 ENCOUNTER — Encounter (INDEPENDENT_AMBULATORY_CARE_PROVIDER_SITE_OTHER): Payer: Self-pay

## 2017-06-16 ENCOUNTER — Institutional Professional Consult (permissible substitution): Payer: Medicare Other | Admitting: Internal Medicine

## 2017-06-16 ENCOUNTER — Encounter: Payer: Self-pay | Admitting: Internal Medicine

## 2017-06-16 VITALS — BP 132/80 | HR 74 | Ht 64.0 in | Wt 144.0 lb

## 2017-06-16 DIAGNOSIS — R42 Dizziness and giddiness: Secondary | ICD-10-CM | POA: Diagnosis not present

## 2017-06-16 DIAGNOSIS — R001 Bradycardia, unspecified: Secondary | ICD-10-CM | POA: Diagnosis not present

## 2017-06-16 NOTE — Progress Notes (Signed)
ELECTROPHYSIOLOGY CONSULT NOTE  Patient ID: Anne [name], MRN: [id number], DOB/AGE: 06-19-1929 83 y.o. Admit date: (Not on file) Date of Consult: 06/16/2017  Primary Physician: Thressa Sheller, MD Primary Cardiologist: new   Anne [name] is being seen today for the evaluation of bradycardia at the request of  Thressa Sheller, MD.   HPI Anne [name] is a 83 y.o. female  Married [place] woman whose husband is very quick to interject  She was noted by her PCP To have a slow heart rate. Her husband is cared for by Dr. Meda Coffee and so at visit she asked for assessment.  Holder was recommended she is referred because of the findings  Her history is inconsistent. She initially describes dizziness upon climbing stairs and then is not sure. She describes episodes of presyncope and is not sure. In response to any positive symptoms her husband interjects that that has never happened.  She denies chest pain or shortness of breath. Does not have peripheral edema. She has some palpitations. They're intermittent. They may or may not be associated with dizziness described above.    Holter reviewed personally     Past Medical History:  Diagnosis Date  . Abdominal distension   . Abdominal pain   . Anxiety    facial twitch and click , twitch lt shoulder  due to anxiety  . Arthritis    right first finger  . Chronic kidney disease    hx uti 1 mo ago was med tx   . Constipation   . Depression   . GERD (gastroesophageal reflux disease)   . Hypothyroidism   . Itching    at times, area varies  . No pertinent past medical history    OCC PAINS TO ABDOMEN AND CHEST , DR BYERLY PLACED PATIENT ON MED FOR GERD FOR 2 WEEKS  . Thyroid disease       Surgical History:  Past Surgical History:  Procedure Laterality Date  . ABDOMINAL HYSTERECTOMY     partial - patient does not remember date  . APPENDECTOMY  1948  . bladder tack     Patient does not remember date  .  CHOLECYSTECTOMY  12/30/2011   Procedure: LAPAROSCOPIC CHOLECYSTECTOMY WITH INTRAOPERATIVE CHOLANGIOGRAM;  Surgeon: Stark Klein, MD;  Location: [facility];  Service: General;  Laterality: N/A;  . colonscopy  01-12-2014  . LAPAROSCOPIC RIGHT HEMI COLECTOMY Right 12/19/2013   Procedure: LAPAROSCOPIC RIGHT HEMI COLECTOMY;  Surgeon: Shann Medal, MD;  Location: [facility];  Service: General;  Laterality: Right;  . NOSE SURGERY     1970  . vericose vein removal     legs. does not remember date of procedure     Home Meds: Prior to Admission medications   Medication Sig Start Date End Date Taking? Authorizing Provider  ALPRAZolam (XANAX) 0.25 MG tablet Take 0.125 mg by mouth 4 (four) times daily.    Yes [provider]  Cholecalciferol (VITAMIN D3) 1000 UNITS CAPS Take 1,000 Units by mouth daily.    Yes [provider]  docusate sodium (COLACE) 100 MG capsule Take 100 mg by mouth daily as needed for mild constipation (constipation).   Yes [provider]  ELDERBERRY PO Take 5 mLs by mouth. "boost immune system"   Yes [provider]  feeding supplement, GLUCERNA SHAKE, (GLUCERNA SHAKE) LIQD Take 237 mLs by mouth daily.   Yes [provider]  Multiple Vitamin (MULTIVITAMIN WITH MINERALS) TABS tablet Take 1 tablet by mouth  every other day.    Yes [provider]  polyethylene glycol (MIRALAX / GLYCOLAX) packet Take 17 g by mouth daily as needed for mild constipation (constipation).   Yes [provider]  simvastatin (ZOCOR) 20 MG tablet Take 20 mg by mouth every evening.   Yes [provider]  SYNTHROID 88 MCG tablet Take 88 mcg by mouth daily before breakfast.  08/17/11  Yes [provider]    Allergies: No Known Allergies  Social History   Social History  . Marital status: Married    Spouse name: N/A  . Number of children: 3  . Years of education: N/A   Occupational History  . Not on file.   Social History Main  Topics  . Smoking status: Never Smoker  . Smokeless tobacco: Never Used  . Alcohol use No  . Drug use: No  . Sexual activity: Not on file   Other Topics Concern  . Not on file   Social History Narrative  . No narrative on file     Family History  Problem Relation Age of Onset  . Stroke Mother   . Cancer Sister        breast  . Cancer Brother        lung and thyroid    ROS:  Please see the history of present illness.     All other systems reviewed and negative.    Physical Exam: Blood pressure 132/80, pulse 74, height 5\' 4"  (1.626 m), weight 144 lb (65.3 kg), SpO2 98 %. General: Well developed, well nourished female in no acute distress. Head: Normocephalic, atraumatic, sclera non-icteric, no xanthomas, nares are without discharge. EENT: normal  Lymph Nodes:  none Neck: Negative for carotid bruits. JVD not elevated. Back:without scoliosis kyphosis  Lungs: Clear bilaterally to auscultation without wheezes, rales, or rhonchi. Breathing is unlabored. Heart:  mostly RRR with S1 S2.  2/6 systolic  murmur . No rubs, or gallops appreciated. Abdomen: Soft, non-tender, non-distended with normoactive bowel sounds. No hepatomegaly. No rebound/guarding. No obvious abdominal masses. Msk:  Strength and tone appear normal for age. Extremities: No clubbing or cyanosis. No edema.  Distal pedal pulses are 2+ and equal bilaterally. Skin: Warm and Dry Neuro: Alert and oriented X 3. CN III-XII intact Grossly normal sensory and motor function . Psych:  Responds to questions appropriately with a normal affect.      Labs: Cardiac Enzymes No results for input(s): CKTOTAL, CKMB, TROPONINI in the last 72 hours. CBC Lab Results  Component Value Date   WBC 5.7 07/24/2015   HGB 14.7 07/24/2015   HCT 44.8 07/24/2015   MCV 93.4 07/24/2015   PLT 165 07/24/2015   PROTIME: No results for input(s): LABPROT, INR in the last 72 hours. Chemistry No results for input(s): NA, K, CL, CO2, BUN,  CREATININE, CALCIUM, PROT, BILITOT, ALKPHOS, ALT, AST, GLUCOSE in the last 168 hours.  Invalid input(s): LABALBU Lipids No results found for: CHOL, HDL, LDLCALC, TRIG BNP No results found for: PROBNP Thyroid Function Tests: No results for input(s): TSH, T4TOTAL, T3FREE, THYROIDAB in the last 72 hours.  Invalid input(s): FREET3 Miscellaneous No results found for: DDIMER  Radiology/Studies:  No results found.  EKG:She had 2 ECGs 11:38:35 demonstrates junctional rhythm with occasional sinus beat. Junctional rate was 63 11:38:55 sinus at 66 Intervals 21/07/46   Holter was personally reviewed Junctional rhythm was noted bradycardia was noted at 07 40. She is normally asleep at this time.   sinus bradycardia was noted  with rates in the high 40s.  Assessment and Plan:  Sinus node dysfunction  Dizziness  The patient has evidence of sinus node dysfunction but it is not clearly associated with symptoms. The dizziness that she has remained not any real in her ability or willingness to give a cogent consistent history is lacking. I emphasized to her and her husband that, while she didn't want a pacemaker, and this was evident that she started crying when I told her she didn't need it, and that these were tears of joy, and even more than not wanting a pacemaker she did not want to fall.  I have tried to emphasize to her and her husband that symptoms of dizziness are concerning in light of her Holter monitor could represent a presyncopal event. She might benefit from an implantable loop recorder.     Virl Axe

## 2017-06-16 NOTE — Patient Instructions (Signed)
Medication Instructions: Your physician recommends that you continue on your current medications as directed. Please refer to the Current Medication list given to you today.   Labwork: None Ordered  Procedures/Testing: None Ordered  Follow-Up: Your physician wants you to follow-up in 6 MONTHS with Dr. Klein. You will receive a reminder letter in the mail two months in advance. If you don't receive a letter, please call our office to schedule the follow-up appointment.   Any Additional Special Instructions Will Be Listed Below (If Applicable).     If you need a refill on your cardiac medications before your next appointment, please call your pharmacy.   

## 2017-07-13 DIAGNOSIS — E1122 Type 2 diabetes mellitus with diabetic chronic kidney disease: Secondary | ICD-10-CM | POA: Diagnosis not present

## 2017-07-13 DIAGNOSIS — E559 Vitamin D deficiency, unspecified: Secondary | ICD-10-CM | POA: Diagnosis not present

## 2017-07-13 DIAGNOSIS — N390 Urinary tract infection, site not specified: Secondary | ICD-10-CM | POA: Diagnosis not present

## 2017-07-13 DIAGNOSIS — Z0000 Encounter for general adult medical examination without abnormal findings: Secondary | ICD-10-CM | POA: Diagnosis not present

## 2017-07-13 DIAGNOSIS — Z136 Encounter for screening for cardiovascular disorders: Secondary | ICD-10-CM | POA: Diagnosis not present

## 2017-07-19 ENCOUNTER — Ambulatory Visit: Payer: Medicare Other | Admitting: Physician Assistant

## 2017-07-20 DIAGNOSIS — N189 Chronic kidney disease, unspecified: Secondary | ICD-10-CM | POA: Diagnosis not present

## 2017-07-20 DIAGNOSIS — Z0000 Encounter for general adult medical examination without abnormal findings: Secondary | ICD-10-CM | POA: Diagnosis not present

## 2017-07-20 DIAGNOSIS — E119 Type 2 diabetes mellitus without complications: Secondary | ICD-10-CM | POA: Diagnosis not present

## 2017-07-20 DIAGNOSIS — R319 Hematuria, unspecified: Secondary | ICD-10-CM | POA: Diagnosis not present

## 2017-07-20 DIAGNOSIS — E7800 Pure hypercholesterolemia, unspecified: Secondary | ICD-10-CM | POA: Diagnosis not present

## 2017-08-03 DIAGNOSIS — R300 Dysuria: Secondary | ICD-10-CM | POA: Diagnosis not present

## 2017-08-09 DIAGNOSIS — N760 Acute vaginitis: Secondary | ICD-10-CM | POA: Diagnosis not present

## 2017-08-09 DIAGNOSIS — N390 Urinary tract infection, site not specified: Secondary | ICD-10-CM | POA: Diagnosis not present

## 2017-09-09 DIAGNOSIS — R311 Benign essential microscopic hematuria: Secondary | ICD-10-CM | POA: Diagnosis not present

## 2017-09-16 DIAGNOSIS — R311 Benign essential microscopic hematuria: Secondary | ICD-10-CM | POA: Diagnosis not present

## 2017-09-22 DIAGNOSIS — Z01419 Encounter for gynecological examination (general) (routine) without abnormal findings: Secondary | ICD-10-CM | POA: Diagnosis not present

## 2017-09-22 DIAGNOSIS — Z1231 Encounter for screening mammogram for malignant neoplasm of breast: Secondary | ICD-10-CM | POA: Diagnosis not present

## 2017-09-27 DIAGNOSIS — R3121 Asymptomatic microscopic hematuria: Secondary | ICD-10-CM | POA: Diagnosis not present

## 2017-09-27 DIAGNOSIS — R311 Benign essential microscopic hematuria: Secondary | ICD-10-CM | POA: Diagnosis not present

## 2017-10-04 DIAGNOSIS — R3121 Asymptomatic microscopic hematuria: Secondary | ICD-10-CM | POA: Diagnosis not present

## 2017-10-04 DIAGNOSIS — N952 Postmenopausal atrophic vaginitis: Secondary | ICD-10-CM | POA: Diagnosis not present

## 2017-11-02 DIAGNOSIS — Z1211 Encounter for screening for malignant neoplasm of colon: Secondary | ICD-10-CM | POA: Diagnosis not present

## 2017-11-02 DIAGNOSIS — K219 Gastro-esophageal reflux disease without esophagitis: Secondary | ICD-10-CM | POA: Diagnosis not present

## 2017-11-02 DIAGNOSIS — Z85038 Personal history of other malignant neoplasm of large intestine: Secondary | ICD-10-CM | POA: Diagnosis not present

## 2017-11-02 DIAGNOSIS — K5904 Chronic idiopathic constipation: Secondary | ICD-10-CM | POA: Diagnosis not present

## 2017-11-29 DIAGNOSIS — Z1211 Encounter for screening for malignant neoplasm of colon: Secondary | ICD-10-CM | POA: Diagnosis not present

## 2017-11-29 DIAGNOSIS — K635 Polyp of colon: Secondary | ICD-10-CM | POA: Diagnosis not present

## 2017-11-29 DIAGNOSIS — D123 Benign neoplasm of transverse colon: Secondary | ICD-10-CM | POA: Diagnosis not present

## 2017-11-29 DIAGNOSIS — Z85038 Personal history of other malignant neoplasm of large intestine: Secondary | ICD-10-CM | POA: Diagnosis not present

## 2018-01-13 DIAGNOSIS — E119 Type 2 diabetes mellitus without complications: Secondary | ICD-10-CM | POA: Diagnosis not present

## 2018-01-13 DIAGNOSIS — E039 Hypothyroidism, unspecified: Secondary | ICD-10-CM | POA: Diagnosis not present

## 2018-01-13 DIAGNOSIS — E7800 Pure hypercholesterolemia, unspecified: Secondary | ICD-10-CM | POA: Diagnosis not present

## 2018-01-20 DIAGNOSIS — K219 Gastro-esophageal reflux disease without esophagitis: Secondary | ICD-10-CM | POA: Diagnosis not present

## 2018-01-20 DIAGNOSIS — E039 Hypothyroidism, unspecified: Secondary | ICD-10-CM | POA: Diagnosis not present

## 2018-01-20 DIAGNOSIS — E7800 Pure hypercholesterolemia, unspecified: Secondary | ICD-10-CM | POA: Diagnosis not present

## 2018-02-21 DIAGNOSIS — E119 Type 2 diabetes mellitus without complications: Secondary | ICD-10-CM | POA: Diagnosis not present

## 2018-02-24 DIAGNOSIS — E119 Type 2 diabetes mellitus without complications: Secondary | ICD-10-CM | POA: Diagnosis not present

## 2018-03-15 DIAGNOSIS — H353132 Nonexudative age-related macular degeneration, bilateral, intermediate dry stage: Secondary | ICD-10-CM | POA: Diagnosis not present

## 2018-03-15 DIAGNOSIS — E119 Type 2 diabetes mellitus without complications: Secondary | ICD-10-CM | POA: Diagnosis not present

## 2018-03-15 DIAGNOSIS — H25813 Combined forms of age-related cataract, bilateral: Secondary | ICD-10-CM | POA: Diagnosis not present

## 2018-05-05 DIAGNOSIS — N390 Urinary tract infection, site not specified: Secondary | ICD-10-CM | POA: Diagnosis not present

## 2018-05-05 DIAGNOSIS — E039 Hypothyroidism, unspecified: Secondary | ICD-10-CM | POA: Diagnosis not present

## 2018-05-05 DIAGNOSIS — A499 Bacterial infection, unspecified: Secondary | ICD-10-CM | POA: Diagnosis not present

## 2018-05-05 DIAGNOSIS — R1084 Generalized abdominal pain: Secondary | ICD-10-CM | POA: Diagnosis not present

## 2018-07-11 DIAGNOSIS — M25552 Pain in left hip: Secondary | ICD-10-CM | POA: Diagnosis not present

## 2018-07-11 DIAGNOSIS — W19XXXA Unspecified fall, initial encounter: Secondary | ICD-10-CM | POA: Diagnosis not present

## 2018-07-11 DIAGNOSIS — M25551 Pain in right hip: Secondary | ICD-10-CM | POA: Diagnosis not present

## 2018-07-11 DIAGNOSIS — M545 Low back pain: Secondary | ICD-10-CM | POA: Diagnosis not present

## 2018-07-19 DIAGNOSIS — Z0001 Encounter for general adult medical examination with abnormal findings: Secondary | ICD-10-CM | POA: Diagnosis not present

## 2018-07-19 DIAGNOSIS — E1122 Type 2 diabetes mellitus with diabetic chronic kidney disease: Secondary | ICD-10-CM | POA: Diagnosis not present

## 2018-07-19 DIAGNOSIS — Z0000 Encounter for general adult medical examination without abnormal findings: Secondary | ICD-10-CM | POA: Diagnosis not present

## 2018-07-19 DIAGNOSIS — E785 Hyperlipidemia, unspecified: Secondary | ICD-10-CM | POA: Diagnosis not present

## 2018-07-19 DIAGNOSIS — E039 Hypothyroidism, unspecified: Secondary | ICD-10-CM | POA: Diagnosis not present

## 2018-07-26 DIAGNOSIS — I771 Stricture of artery: Secondary | ICD-10-CM | POA: Diagnosis not present

## 2018-07-26 DIAGNOSIS — N189 Chronic kidney disease, unspecified: Secondary | ICD-10-CM | POA: Diagnosis not present

## 2018-07-26 DIAGNOSIS — E7800 Pure hypercholesterolemia, unspecified: Secondary | ICD-10-CM | POA: Diagnosis not present

## 2018-07-26 DIAGNOSIS — N390 Urinary tract infection, site not specified: Secondary | ICD-10-CM | POA: Diagnosis not present

## 2018-07-26 DIAGNOSIS — E119 Type 2 diabetes mellitus without complications: Secondary | ICD-10-CM | POA: Diagnosis not present

## 2018-07-26 DIAGNOSIS — Z0000 Encounter for general adult medical examination without abnormal findings: Secondary | ICD-10-CM | POA: Diagnosis not present

## 2018-08-08 DIAGNOSIS — N952 Postmenopausal atrophic vaginitis: Secondary | ICD-10-CM | POA: Diagnosis not present

## 2018-08-08 DIAGNOSIS — R8279 Other abnormal findings on microbiological examination of urine: Secondary | ICD-10-CM | POA: Diagnosis not present

## 2018-08-29 DIAGNOSIS — N760 Acute vaginitis: Secondary | ICD-10-CM | POA: Diagnosis not present

## 2018-10-13 DIAGNOSIS — R311 Benign essential microscopic hematuria: Secondary | ICD-10-CM | POA: Diagnosis not present

## 2018-10-13 DIAGNOSIS — N952 Postmenopausal atrophic vaginitis: Secondary | ICD-10-CM | POA: Diagnosis not present

## 2019-01-19 DIAGNOSIS — N390 Urinary tract infection, site not specified: Secondary | ICD-10-CM | POA: Diagnosis not present

## 2019-01-19 DIAGNOSIS — E1122 Type 2 diabetes mellitus with diabetic chronic kidney disease: Secondary | ICD-10-CM | POA: Diagnosis not present

## 2019-01-19 DIAGNOSIS — E039 Hypothyroidism, unspecified: Secondary | ICD-10-CM | POA: Diagnosis not present

## 2019-01-19 DIAGNOSIS — E785 Hyperlipidemia, unspecified: Secondary | ICD-10-CM | POA: Diagnosis not present

## 2019-01-25 DIAGNOSIS — R110 Nausea: Secondary | ICD-10-CM | POA: Diagnosis not present

## 2019-01-25 DIAGNOSIS — M533 Sacrococcygeal disorders, not elsewhere classified: Secondary | ICD-10-CM | POA: Diagnosis not present

## 2019-01-25 DIAGNOSIS — N3001 Acute cystitis with hematuria: Secondary | ICD-10-CM | POA: Diagnosis not present

## 2019-04-25 ENCOUNTER — Telehealth: Payer: Self-pay

## 2019-04-25 NOTE — Telephone Encounter (Signed)
I called and left patient a message about setting up telehealth visit. Patient is on recall list and Dr. Caryl Comes has several openings tomorrow.

## 2019-05-05 NOTE — Telephone Encounter (Signed)
Patient did not call back and schedule follow up appointment. 

## 2019-05-19 DIAGNOSIS — E039 Hypothyroidism, unspecified: Secondary | ICD-10-CM | POA: Diagnosis not present

## 2019-05-19 DIAGNOSIS — N189 Chronic kidney disease, unspecified: Secondary | ICD-10-CM | POA: Diagnosis not present

## 2019-05-19 DIAGNOSIS — E785 Hyperlipidemia, unspecified: Secondary | ICD-10-CM | POA: Diagnosis not present

## 2019-05-19 DIAGNOSIS — E1122 Type 2 diabetes mellitus with diabetic chronic kidney disease: Secondary | ICD-10-CM | POA: Diagnosis not present

## 2019-05-26 DIAGNOSIS — Z0000 Encounter for general adult medical examination without abnormal findings: Secondary | ICD-10-CM | POA: Diagnosis not present

## 2019-05-26 DIAGNOSIS — K219 Gastro-esophageal reflux disease without esophagitis: Secondary | ICD-10-CM | POA: Diagnosis not present

## 2019-05-26 DIAGNOSIS — E039 Hypothyroidism, unspecified: Secondary | ICD-10-CM | POA: Diagnosis not present

## 2019-05-26 DIAGNOSIS — R079 Chest pain, unspecified: Secondary | ICD-10-CM | POA: Diagnosis not present

## 2019-05-26 DIAGNOSIS — E559 Vitamin D deficiency, unspecified: Secondary | ICD-10-CM | POA: Diagnosis not present

## 2019-05-26 DIAGNOSIS — E1122 Type 2 diabetes mellitus with diabetic chronic kidney disease: Secondary | ICD-10-CM | POA: Diagnosis not present

## 2019-08-21 DIAGNOSIS — E785 Hyperlipidemia, unspecified: Secondary | ICD-10-CM | POA: Diagnosis not present

## 2019-08-21 DIAGNOSIS — E559 Vitamin D deficiency, unspecified: Secondary | ICD-10-CM | POA: Diagnosis not present

## 2019-08-21 DIAGNOSIS — E1122 Type 2 diabetes mellitus with diabetic chronic kidney disease: Secondary | ICD-10-CM | POA: Diagnosis not present

## 2019-08-24 DIAGNOSIS — E1122 Type 2 diabetes mellitus with diabetic chronic kidney disease: Secondary | ICD-10-CM | POA: Diagnosis not present

## 2019-08-24 DIAGNOSIS — R51 Headache: Secondary | ICD-10-CM | POA: Diagnosis not present

## 2019-08-24 DIAGNOSIS — E785 Hyperlipidemia, unspecified: Secondary | ICD-10-CM | POA: Diagnosis not present

## 2019-08-29 ENCOUNTER — Other Ambulatory Visit: Payer: Self-pay | Admitting: Internal Medicine

## 2019-08-29 DIAGNOSIS — R519 Headache, unspecified: Secondary | ICD-10-CM

## 2019-09-05 ENCOUNTER — Other Ambulatory Visit: Payer: Self-pay

## 2019-09-05 ENCOUNTER — Ambulatory Visit
Admission: RE | Admit: 2019-09-05 | Discharge: 2019-09-05 | Disposition: A | Discharge status: Home / Self Care | Payer: Medicare Other | Source: Ambulatory Visit | Attending: Internal Medicine | Admitting: Internal Medicine

## 2019-09-05 DIAGNOSIS — I6782 Cerebral ischemia: Secondary | ICD-10-CM | POA: Diagnosis not present

## 2019-09-05 DIAGNOSIS — R519 Headache, unspecified: Secondary | ICD-10-CM

## 2019-10-11 DIAGNOSIS — Z6824 Body mass index (BMI) 24.0-24.9, adult: Secondary | ICD-10-CM | POA: Diagnosis not present

## 2019-10-11 DIAGNOSIS — Z124 Encounter for screening for malignant neoplasm of cervix: Secondary | ICD-10-CM | POA: Diagnosis not present

## 2019-12-18 DIAGNOSIS — E1122 Type 2 diabetes mellitus with diabetic chronic kidney disease: Secondary | ICD-10-CM | POA: Diagnosis not present

## 2019-12-25 DIAGNOSIS — Z0000 Encounter for general adult medical examination without abnormal findings: Secondary | ICD-10-CM | POA: Diagnosis not present

## 2019-12-25 DIAGNOSIS — E785 Hyperlipidemia, unspecified: Secondary | ICD-10-CM | POA: Diagnosis not present

## 2019-12-25 DIAGNOSIS — E1122 Type 2 diabetes mellitus with diabetic chronic kidney disease: Secondary | ICD-10-CM | POA: Diagnosis not present

## 2019-12-25 DIAGNOSIS — E039 Hypothyroidism, unspecified: Secondary | ICD-10-CM | POA: Diagnosis not present

## 2019-12-25 DIAGNOSIS — E559 Vitamin D deficiency, unspecified: Secondary | ICD-10-CM | POA: Diagnosis not present

## 2019-12-29 ENCOUNTER — Ambulatory Visit: Payer: Medicare Other

## 2020-01-15 ENCOUNTER — Ambulatory Visit: Payer: Medicare Other

## 2020-02-26 DIAGNOSIS — M25561 Pain in right knee: Secondary | ICD-10-CM | POA: Diagnosis not present

## 2020-03-14 DIAGNOSIS — D225 Melanocytic nevi of trunk: Secondary | ICD-10-CM | POA: Diagnosis not present

## 2020-03-14 DIAGNOSIS — L821 Other seborrheic keratosis: Secondary | ICD-10-CM | POA: Diagnosis not present

## 2020-03-14 DIAGNOSIS — D1801 Hemangioma of skin and subcutaneous tissue: Secondary | ICD-10-CM | POA: Diagnosis not present

## 2020-05-20 DIAGNOSIS — E1122 Type 2 diabetes mellitus with diabetic chronic kidney disease: Secondary | ICD-10-CM | POA: Diagnosis not present

## 2020-05-20 DIAGNOSIS — Z79899 Other long term (current) drug therapy: Secondary | ICD-10-CM | POA: Diagnosis not present

## 2020-05-20 DIAGNOSIS — E039 Hypothyroidism, unspecified: Secondary | ICD-10-CM | POA: Diagnosis not present

## 2020-05-20 DIAGNOSIS — Z0000 Encounter for general adult medical examination without abnormal findings: Secondary | ICD-10-CM | POA: Diagnosis not present

## 2020-05-20 DIAGNOSIS — E559 Vitamin D deficiency, unspecified: Secondary | ICD-10-CM | POA: Diagnosis not present

## 2020-05-20 DIAGNOSIS — E785 Hyperlipidemia, unspecified: Secondary | ICD-10-CM | POA: Diagnosis not present

## 2020-05-29 DIAGNOSIS — E039 Hypothyroidism, unspecified: Secondary | ICD-10-CM | POA: Diagnosis not present

## 2020-05-29 DIAGNOSIS — E785 Hyperlipidemia, unspecified: Secondary | ICD-10-CM | POA: Diagnosis not present

## 2020-05-29 DIAGNOSIS — K219 Gastro-esophageal reflux disease without esophagitis: Secondary | ICD-10-CM | POA: Diagnosis not present

## 2020-05-29 DIAGNOSIS — Z0000 Encounter for general adult medical examination without abnormal findings: Secondary | ICD-10-CM | POA: Diagnosis not present

## 2020-11-12 DIAGNOSIS — H6123 Impacted cerumen, bilateral: Secondary | ICD-10-CM | POA: Diagnosis not present

## 2020-11-12 DIAGNOSIS — H9190 Unspecified hearing loss, unspecified ear: Secondary | ICD-10-CM | POA: Diagnosis not present

## 2020-11-12 DIAGNOSIS — Z789 Other specified health status: Secondary | ICD-10-CM | POA: Diagnosis not present

## 2020-12-11 DIAGNOSIS — E039 Hypothyroidism, unspecified: Secondary | ICD-10-CM | POA: Diagnosis not present

## 2020-12-11 DIAGNOSIS — E559 Vitamin D deficiency, unspecified: Secondary | ICD-10-CM | POA: Diagnosis not present

## 2020-12-11 DIAGNOSIS — E785 Hyperlipidemia, unspecified: Secondary | ICD-10-CM | POA: Diagnosis not present

## 2020-12-11 DIAGNOSIS — E1122 Type 2 diabetes mellitus with diabetic chronic kidney disease: Secondary | ICD-10-CM | POA: Diagnosis not present

## 2020-12-23 DIAGNOSIS — E039 Hypothyroidism, unspecified: Secondary | ICD-10-CM | POA: Diagnosis not present

## 2020-12-23 DIAGNOSIS — K219 Gastro-esophageal reflux disease without esophagitis: Secondary | ICD-10-CM | POA: Diagnosis not present

## 2020-12-23 DIAGNOSIS — E785 Hyperlipidemia, unspecified: Secondary | ICD-10-CM | POA: Diagnosis not present

## 2020-12-23 DIAGNOSIS — E559 Vitamin D deficiency, unspecified: Secondary | ICD-10-CM | POA: Diagnosis not present

## 2020-12-23 DIAGNOSIS — E1122 Type 2 diabetes mellitus with diabetic chronic kidney disease: Secondary | ICD-10-CM | POA: Diagnosis not present

## 2021-01-01 DIAGNOSIS — H353132 Nonexudative age-related macular degeneration, bilateral, intermediate dry stage: Secondary | ICD-10-CM | POA: Diagnosis not present

## 2021-01-01 DIAGNOSIS — H25813 Combined forms of age-related cataract, bilateral: Secondary | ICD-10-CM | POA: Diagnosis not present

## 2021-01-01 DIAGNOSIS — E119 Type 2 diabetes mellitus without complications: Secondary | ICD-10-CM | POA: Diagnosis not present

## 2021-02-05 DIAGNOSIS — I517 Cardiomegaly: Secondary | ICD-10-CM | POA: Diagnosis not present

## 2021-02-05 DIAGNOSIS — R059 Cough, unspecified: Secondary | ICD-10-CM | POA: Diagnosis not present

## 2021-02-05 DIAGNOSIS — R0989 Other specified symptoms and signs involving the circulatory and respiratory systems: Secondary | ICD-10-CM | POA: Diagnosis not present

## 2021-03-12 DIAGNOSIS — R634 Abnormal weight loss: Secondary | ICD-10-CM | POA: Diagnosis not present

## 2021-03-12 DIAGNOSIS — E1122 Type 2 diabetes mellitus with diabetic chronic kidney disease: Secondary | ICD-10-CM | POA: Diagnosis not present

## 2021-03-12 DIAGNOSIS — E039 Hypothyroidism, unspecified: Secondary | ICD-10-CM | POA: Diagnosis not present

## 2021-03-12 DIAGNOSIS — R197 Diarrhea, unspecified: Secondary | ICD-10-CM | POA: Diagnosis not present

## 2021-03-17 DIAGNOSIS — R197 Diarrhea, unspecified: Secondary | ICD-10-CM | POA: Diagnosis not present

## 2021-03-17 DIAGNOSIS — C189 Malignant neoplasm of colon, unspecified: Secondary | ICD-10-CM | POA: Diagnosis not present

## 2021-04-02 DIAGNOSIS — E039 Hypothyroidism, unspecified: Secondary | ICD-10-CM | POA: Diagnosis not present

## 2021-04-30 DIAGNOSIS — E039 Hypothyroidism, unspecified: Secondary | ICD-10-CM | POA: Diagnosis not present

## 2021-05-12 DIAGNOSIS — E039 Hypothyroidism, unspecified: Secondary | ICD-10-CM | POA: Diagnosis not present

## 2021-06-04 DIAGNOSIS — E1122 Type 2 diabetes mellitus with diabetic chronic kidney disease: Secondary | ICD-10-CM | POA: Diagnosis not present

## 2021-06-11 DIAGNOSIS — E039 Hypothyroidism, unspecified: Secondary | ICD-10-CM | POA: Diagnosis not present

## 2021-06-11 DIAGNOSIS — N183 Chronic kidney disease, stage 3 unspecified: Secondary | ICD-10-CM | POA: Diagnosis not present

## 2021-06-11 DIAGNOSIS — I700 Atherosclerosis of aorta: Secondary | ICD-10-CM | POA: Diagnosis not present

## 2021-06-11 DIAGNOSIS — E785 Hyperlipidemia, unspecified: Secondary | ICD-10-CM | POA: Diagnosis not present

## 2021-06-11 DIAGNOSIS — K219 Gastro-esophageal reflux disease without esophagitis: Secondary | ICD-10-CM | POA: Diagnosis not present

## 2021-06-11 DIAGNOSIS — C189 Malignant neoplasm of colon, unspecified: Secondary | ICD-10-CM | POA: Diagnosis not present

## 2021-06-11 DIAGNOSIS — Z0000 Encounter for general adult medical examination without abnormal findings: Secondary | ICD-10-CM | POA: Diagnosis not present

## 2021-06-11 DIAGNOSIS — E559 Vitamin D deficiency, unspecified: Secondary | ICD-10-CM | POA: Diagnosis not present

## 2021-06-11 DIAGNOSIS — E1122 Type 2 diabetes mellitus with diabetic chronic kidney disease: Secondary | ICD-10-CM | POA: Diagnosis not present

## 2021-08-28 ENCOUNTER — Emergency Department (HOSPITAL_COMMUNITY): Payer: Medicare Other

## 2021-08-28 ENCOUNTER — Inpatient Hospital Stay (HOSPITAL_COMMUNITY)
Admission: EM | Admit: 2021-08-28 | Discharge: 2021-09-03 | DRG: 062 | Disposition: A | Discharge status: Skilled Nursing Facility | Payer: Medicare Other | Attending: Neurology | Admitting: Neurology

## 2021-08-28 DIAGNOSIS — R001 Bradycardia, unspecified: Secondary | ICD-10-CM | POA: Insufficient documentation

## 2021-08-28 DIAGNOSIS — N179 Acute kidney failure, unspecified: Secondary | ICD-10-CM | POA: Diagnosis present

## 2021-08-28 DIAGNOSIS — Z20822 Contact with and (suspected) exposure to covid-19: Secondary | ICD-10-CM | POA: Diagnosis present

## 2021-08-28 DIAGNOSIS — R29705 NIHSS score 5: Secondary | ICD-10-CM | POA: Diagnosis not present

## 2021-08-28 DIAGNOSIS — I471 Supraventricular tachycardia: Secondary | ICD-10-CM | POA: Diagnosis present

## 2021-08-28 DIAGNOSIS — R29810 Facial weakness: Secondary | ICD-10-CM | POA: Diagnosis present

## 2021-08-28 DIAGNOSIS — R739 Hyperglycemia, unspecified: Secondary | ICD-10-CM | POA: Diagnosis not present

## 2021-08-28 DIAGNOSIS — I10 Essential (primary) hypertension: Secondary | ICD-10-CM | POA: Diagnosis not present

## 2021-08-28 DIAGNOSIS — I63411 Cerebral infarction due to embolism of right middle cerebral artery: Secondary | ICD-10-CM | POA: Diagnosis not present

## 2021-08-28 DIAGNOSIS — E785 Hyperlipidemia, unspecified: Secondary | ICD-10-CM | POA: Diagnosis present

## 2021-08-28 DIAGNOSIS — Z85038 Personal history of other malignant neoplasm of large intestine: Secondary | ICD-10-CM | POA: Diagnosis not present

## 2021-08-28 DIAGNOSIS — I6350 Cerebral infarction due to unspecified occlusion or stenosis of unspecified cerebral artery: Secondary | ICD-10-CM | POA: Diagnosis not present

## 2021-08-28 DIAGNOSIS — N183 Chronic kidney disease, stage 3 unspecified: Secondary | ICD-10-CM | POA: Diagnosis present

## 2021-08-28 DIAGNOSIS — Z8673 Personal history of transient ischemic attack (TIA), and cerebral infarction without residual deficits: Secondary | ICD-10-CM | POA: Diagnosis not present

## 2021-08-28 DIAGNOSIS — I129 Hypertensive chronic kidney disease with stage 1 through stage 4 chronic kidney disease, or unspecified chronic kidney disease: Secondary | ICD-10-CM | POA: Diagnosis present

## 2021-08-28 DIAGNOSIS — I639 Cerebral infarction, unspecified: Secondary | ICD-10-CM | POA: Diagnosis present

## 2021-08-28 DIAGNOSIS — I6523 Occlusion and stenosis of bilateral carotid arteries: Secondary | ICD-10-CM | POA: Diagnosis not present

## 2021-08-28 DIAGNOSIS — F32A Depression, unspecified: Secondary | ICD-10-CM | POA: Diagnosis not present

## 2021-08-28 DIAGNOSIS — E1165 Type 2 diabetes mellitus with hyperglycemia: Secondary | ICD-10-CM | POA: Diagnosis not present

## 2021-08-28 DIAGNOSIS — Z743 Need for continuous supervision: Secondary | ICD-10-CM | POA: Diagnosis not present

## 2021-08-28 DIAGNOSIS — R471 Dysarthria and anarthria: Secondary | ICD-10-CM | POA: Diagnosis present

## 2021-08-28 DIAGNOSIS — Z823 Family history of stroke: Secondary | ICD-10-CM | POA: Diagnosis not present

## 2021-08-28 DIAGNOSIS — E1122 Type 2 diabetes mellitus with diabetic chronic kidney disease: Secondary | ICD-10-CM | POA: Diagnosis not present

## 2021-08-28 DIAGNOSIS — I6522 Occlusion and stenosis of left carotid artery: Secondary | ICD-10-CM | POA: Diagnosis present

## 2021-08-28 DIAGNOSIS — K5900 Constipation, unspecified: Secondary | ICD-10-CM | POA: Diagnosis not present

## 2021-08-28 DIAGNOSIS — R4781 Slurred speech: Secondary | ICD-10-CM | POA: Diagnosis not present

## 2021-08-28 DIAGNOSIS — K219 Gastro-esophageal reflux disease without esophagitis: Secondary | ICD-10-CM | POA: Diagnosis not present

## 2021-08-28 DIAGNOSIS — Z9049 Acquired absence of other specified parts of digestive tract: Secondary | ICD-10-CM

## 2021-08-28 DIAGNOSIS — G319 Degenerative disease of nervous system, unspecified: Secondary | ICD-10-CM | POA: Diagnosis not present

## 2021-08-28 DIAGNOSIS — Z9282 Status post administration of tPA (rtPA) in a different facility within the last 24 hours prior to admission to current facility: Secondary | ICD-10-CM | POA: Diagnosis not present

## 2021-08-28 DIAGNOSIS — R6889 Other general symptoms and signs: Secondary | ICD-10-CM | POA: Diagnosis not present

## 2021-08-28 DIAGNOSIS — E1159 Type 2 diabetes mellitus with other circulatory complications: Secondary | ICD-10-CM | POA: Diagnosis not present

## 2021-08-28 DIAGNOSIS — I6389 Other cerebral infarction: Secondary | ICD-10-CM | POA: Diagnosis not present

## 2021-08-28 DIAGNOSIS — Z79890 Hormone replacement therapy (postmenopausal): Secondary | ICD-10-CM

## 2021-08-28 DIAGNOSIS — E119 Type 2 diabetes mellitus without complications: Secondary | ICD-10-CM | POA: Diagnosis not present

## 2021-08-28 DIAGNOSIS — R531 Weakness: Secondary | ICD-10-CM | POA: Diagnosis not present

## 2021-08-28 DIAGNOSIS — Z7401 Bed confinement status: Secondary | ICD-10-CM | POA: Diagnosis not present

## 2021-08-28 DIAGNOSIS — Z79899 Other long term (current) drug therapy: Secondary | ICD-10-CM

## 2021-08-28 DIAGNOSIS — I493 Ventricular premature depolarization: Secondary | ICD-10-CM | POA: Diagnosis present

## 2021-08-28 DIAGNOSIS — F419 Anxiety disorder, unspecified: Secondary | ICD-10-CM | POA: Diagnosis present

## 2021-08-28 DIAGNOSIS — E039 Hypothyroidism, unspecified: Secondary | ICD-10-CM | POA: Diagnosis present

## 2021-08-28 DIAGNOSIS — I161 Hypertensive emergency: Secondary | ICD-10-CM | POA: Diagnosis not present

## 2021-08-28 DIAGNOSIS — R52 Pain, unspecified: Secondary | ICD-10-CM | POA: Diagnosis not present

## 2021-08-28 DIAGNOSIS — I6501 Occlusion and stenosis of right vertebral artery: Secondary | ICD-10-CM | POA: Diagnosis not present

## 2021-08-28 DIAGNOSIS — Z7901 Long term (current) use of anticoagulants: Secondary | ICD-10-CM | POA: Diagnosis not present

## 2021-08-28 LAB — DIFFERENTIAL
Abs Immature Granulocytes: 0.02 10*3/uL (ref 0.00–0.07)
Basophils Absolute: 0 10*3/uL (ref 0.0–0.1)
Basophils Relative: 1 %
Eosinophils Absolute: 0.1 10*3/uL (ref 0.0–0.5)
Eosinophils Relative: 1 %
Immature Granulocytes: 0 %
Lymphocytes Relative: 32 %
Lymphs Abs: 1.8 10*3/uL (ref 0.7–4.0)
Monocytes Absolute: 0.4 10*3/uL (ref 0.1–1.0)
Monocytes Relative: 6 %
Neutro Abs: 3.4 10*3/uL (ref 1.7–7.7)
Neutrophils Relative %: 60 %

## 2021-08-28 LAB — COMPREHENSIVE METABOLIC PANEL
ALT: 13 U/L (ref 0–44)
AST: 17 U/L (ref 15–41)
Albumin: 3.4 g/dL — ABNORMAL LOW (ref 3.5–5.0)
Alkaline Phosphatase: 49 U/L (ref 38–126)
Anion gap: 7 (ref 5–15)
BUN: 19 mg/dL (ref 8–23)
CO2: 24 mmol/L (ref 22–32)
Calcium: 8.8 mg/dL — ABNORMAL LOW (ref 8.9–10.3)
Chloride: 108 mmol/L (ref 98–111)
Creatinine, Ser: 1.16 mg/dL — ABNORMAL HIGH (ref 0.44–1.00)
GFR, Estimated: 45 mL/min — ABNORMAL LOW (ref >60)
Glucose, Bld: 188 mg/dL — ABNORMAL HIGH (ref 70–99)
Potassium: 3.8 mmol/L (ref 3.5–5.1)
Sodium: 139 mmol/L (ref 135–145)
Total Bilirubin: 0.8 mg/dL (ref 0.3–1.2)
Total Protein: 6.4 g/dL — ABNORMAL LOW (ref 6.5–8.1)

## 2021-08-28 LAB — I-STAT CHEM 8, ED
BUN: 21 mg/dL (ref 8–23)
Calcium, Ion: 1.1 mmol/L — ABNORMAL LOW (ref 1.15–1.40)
Chloride: 106 mmol/L (ref 98–111)
Creatinine, Ser: 0.9 mg/dL (ref 0.44–1.00)
Glucose, Bld: 180 mg/dL — ABNORMAL HIGH (ref 70–99)
HCT: 44 % (ref 36.0–46.0)
Hemoglobin: 15 g/dL (ref 12.0–15.0)
Potassium: 3.8 mmol/L (ref 3.5–5.1)
Sodium: 142 mmol/L (ref 135–145)
TCO2: 24 mmol/L (ref 22–32)

## 2021-08-28 LAB — RAPID URINE DRUG SCREEN, HOSP PERFORMED
Amphetamines: NOT DETECTED
Barbiturates: NOT DETECTED
Benzodiazepines: NOT DETECTED
Cocaine: NOT DETECTED
Opiates: NOT DETECTED
Tetrahydrocannabinol: NOT DETECTED

## 2021-08-28 LAB — CBC
HCT: 43.9 % (ref 36.0–46.0)
Hemoglobin: 14.4 g/dL (ref 12.0–15.0)
MCH: 31.4 pg (ref 26.0–34.0)
MCHC: 32.8 g/dL (ref 30.0–36.0)
MCV: 95.9 fL (ref 80.0–100.0)
Platelets: 131 10*3/uL — ABNORMAL LOW (ref 150–400)
RBC: 4.58 MIL/uL (ref 3.87–5.11)
RDW: 12.2 % (ref 11.5–15.5)
WBC: 5.6 10*3/uL (ref 4.0–10.5)
nRBC: 0 % (ref 0.0–0.2)

## 2021-08-28 LAB — APTT: aPTT: 24 seconds (ref 24–36)

## 2021-08-28 LAB — MRSA NEXT GEN BY PCR, NASAL: MRSA by PCR Next Gen: NOT DETECTED

## 2021-08-28 LAB — URINALYSIS, COMPLETE (UACMP) WITH MICROSCOPIC
Bilirubin Urine: NEGATIVE
Glucose, UA: 50 mg/dL — AB
Ketones, ur: NEGATIVE mg/dL
Leukocytes,Ua: NEGATIVE
Nitrite: NEGATIVE
Protein, ur: NEGATIVE mg/dL
Specific Gravity, Urine: 1.021 (ref 1.005–1.030)
pH: 7 (ref 5.0–8.0)

## 2021-08-28 LAB — ETHANOL: Alcohol, Ethyl (B): <10 mg/dL (ref <10)

## 2021-08-28 LAB — CBG MONITORING, ED: Glucose-Capillary: 176 mg/dL — ABNORMAL HIGH (ref 70–99)

## 2021-08-28 LAB — PROTIME-INR
INR: 1.1 (ref 0.8–1.2)
Prothrombin Time: 14 seconds (ref 11.4–15.2)

## 2021-08-28 MED ORDER — IOHEXOL 350 MG/ML SOLN
75.0000 mL | Freq: Once | INTRAVENOUS | Status: AC | PRN
  Administered 2021-08-28: 75 mL via INTRAVENOUS

## 2021-08-28 MED ORDER — SODIUM CHLORIDE 0.9% FLUSH
3.0000 mL | Freq: Once | INTRAVENOUS | Status: DC
Start: 2021-08-28 — End: 2021-09-03

## 2021-08-28 MED ORDER — SODIUM CHLORIDE 0.9 % IV SOLN: INTRAVENOUS | Status: DC

## 2021-08-28 MED ORDER — ACETAMINOPHEN 160 MG/5ML PO SOLN: 650.0000 mg | ORAL | Status: DC | PRN

## 2021-08-28 MED ORDER — STROKE: EARLY STAGES OF RECOVERY BOOK
Freq: Once | Status: DC
Start: 2021-08-28 — End: 2021-09-03
  Filled 2021-08-28 (×3): qty 1

## 2021-08-28 MED ORDER — CHLORHEXIDINE GLUCONATE CLOTH 2 % EX PADS
6.0000 | MEDICATED_PAD | Freq: Every day | CUTANEOUS | Status: DC
  Administered 2021-08-29 – 2021-08-30 (×2): 6 via TOPICAL

## 2021-08-28 MED ORDER — ACETAMINOPHEN 650 MG RE SUPP: 650.0000 mg | RECTAL | Status: DC | PRN

## 2021-08-28 MED ORDER — TENECTEPLASE FOR STROKE
0.2500 mg/kg | PACK | Freq: Once | INTRAVENOUS | Status: AC
  Administered 2021-08-28: 16 mg via INTRAVENOUS
  Filled 2021-08-28: qty 3.2

## 2021-08-28 MED ORDER — SENNOSIDES-DOCUSATE SODIUM 8.6-50 MG PO TABS: 1.0000 | ORAL_TABLET | Freq: Every evening | ORAL | Status: DC | PRN

## 2021-08-28 MED ORDER — PANTOPRAZOLE SODIUM 40 MG IV SOLR
40.0000 mg | Freq: Every day | INTRAVENOUS | Status: DC
  Administered 2021-08-28 – 2021-08-29 (×2): 40 mg via INTRAVENOUS
  Filled 2021-08-28 (×2): qty 40

## 2021-08-28 MED ORDER — LABETALOL HCL 5 MG/ML IV SOLN
10.0000 mg | Freq: Once | INTRAVENOUS | Status: AC
  Administered 2021-08-28: 10 mg via INTRAVENOUS

## 2021-08-28 MED ORDER — CLEVIDIPINE BUTYRATE 0.5 MG/ML IV EMUL
0.0000 mg/h | INTRAVENOUS | Status: DC
  Administered 2021-08-28: 1 mg/h via INTRAVENOUS

## 2021-08-28 MED ORDER — ACETAMINOPHEN 325 MG PO TABS: 650.0000 mg | ORAL_TABLET | ORAL | Status: DC | PRN

## 2021-08-28 NOTE — Progress Notes (Signed)
PHARMACIST CODE STROKE RESPONSE  Notified to mix TNK at 1735 by Dr. Leonel Ramsay Delivered TNK to RN at 1737  TNK dose = 16 mg IV over 5 seconds.   Issues/delays encountered (if applicable): BP control requiring labetalol, also contacting family for consent  Bertis Ruddy 08/28/21 5:50 PM

## 2021-08-28 NOTE — ED Triage Notes (Signed)
BIB EMS from home. Granddaughter states that she last saw her well today at 1530. C/o slurred speech and a little aphagia.

## 2021-08-28 NOTE — Code Documentation (Signed)
Stroke Response Nurse Documentation Code Documentation  Anne [name] is a 87 y.o. female arriving to [facility] ED via [facility] on 08/28/21 with past medical hx of CKD, Hypothyroidism, anxiety, GERD, arthritis, depression. She states she has been dizzy and week lately. On No antithrombotic. Code stroke was activated by [facility].   Patient from home with husband where she was LKW at 1530 and now complaining of slurred speech, and facial droop. Patient at home with a sudden onset of facial droop and slurred speech at 1530. Family called other members to come see her and they determined they should call 911. [facility] arrived and activated the code stroke.   Stroke team at the bedside on patient arrival. Labs drawn and patient cleared for CT by Dr. Alvino Chapel. Patient to CT with team. NIHSS 5, see documentation for details and code stroke times. Patient with disoriented, left facial droop, and dysarthria  on exam. The following imaging was completed:  CT/CTA. Patient is a candidate for IV Thrombolytic due to within the window and no other exclusion criteria. 10mg  Labetalol given prior to start and Clevidipine gtt started after TNK given. Patient is not a candidate for IR due to no LVO per MD.   Care/Plan: Q15x2 hrs, Q30x6 hrs, Q1x16 hours, stroke workup, BP<180/105.    Bedside handoff with ED RN Mitzi Hansen.    Dewaun Kinzler, Rande Brunt  Stroke Response RN

## 2021-08-28 NOTE — H&P (Signed)
Neurology History & Physical  Reason for Consult: Code Stroke, slurred speech, left-sided facial droop Referring Physician: Dr.   CC: Slurred speech  History is obtained from: EMS, chart review, patient- limited due to significant dysarthria  HPI: Anne [name] is a 87 y.o. female with a medical history significant for type 2 diabetes mellitus with diabetic chronic kidney disease stage III, depression, GERD, and colon cancer s/p hemicolectomy in 2015 in remission who presented to the ED today via EMS for evaluation of left facial droop and slurred speech.  Patient had family visiting with her today and she was at her baseline status when they left just after 15:00. At 15:30 patient's husband noted that Anne [name] had slurred speech and left-sided facial droop. He called his family that had just left from visiting to come and check on her and EMS was subsequently activated. CBG for EMS was 272 and patient was hypertensive with a systolic blood pressure in the 190's. Patient was able to communicate with providers that she has been feeling dizzy lately and has been under an increased amount of stress.   LKW: 15:00 TNK given?: yes. CTH was reviewed by neurologist without evidence of hemorrhage, risks and benefits of treatment were discussed with patient and family and TNK was administered at 1739 after administering labetalol for blood pressure control. Blood pressure prior to administration was 180/73. IR Thrombectomy? No, presentation is not consistent with LVO, vessel imaging reviewed without LVO.  Modified Rankin Scale: 0-Completely asymptomatic and back to baseline post- stroke  ROS: Unable to obtain a robust ROS due to patient with significant dysarthria with near unintelligible speech.   Past Medical History:  Diagnosis Date   Abdominal distension    Abdominal pain    Anxiety    facial twitch and click , twitch lt shoulder  due to anxiety   Arthritis    right first finger    Chronic kidney disease    hx uti 1 mo ago was med tx    Constipation    Depression    GERD (gastroesophageal reflux disease)    Hypothyroidism    Itching    at times, area varies   No pertinent past medical history    OCC PAINS TO ABDOMEN AND CHEST , DR BYERLY PLACED PATIENT ON MED FOR GERD FOR 2 WEEKS   Thyroid disease    Past Surgical History:  Procedure Laterality Date   ABDOMINAL HYSTERECTOMY     partial - patient does not remember date   APPENDECTOMY  1948   bladder tack     Patient does not remember date   CHOLECYSTECTOMY  12/30/2011   Procedure: LAPAROSCOPIC CHOLECYSTECTOMY WITH INTRAOPERATIVE CHOLANGIOGRAM;  Surgeon: Stark Klein, MD;  Location: [facility];  Service: General;  Laterality: N/A;   colonscopy  01-12-2014   LAPAROSCOPIC RIGHT HEMI COLECTOMY Right 12/19/2013   Procedure: LAPAROSCOPIC RIGHT HEMI COLECTOMY;  Surgeon: Shann Medal, MD;  Location: [facility];  Service: General;  Laterality: Right;   NOSE SURGERY     1970   vericose vein removal     legs. does not remember date of procedure   Family History  Problem Relation Age of Onset   Stroke Mother    Cancer Sister        breast   Cancer Brother        lung and thyroid   Social History:   reports that she has never smoked. She has never used smokeless tobacco. She reports that she does  not drink alcohol and does not use drugs.  Medications  Current Facility-Administered Medications:     stroke: mapping our early stages of recovery book, , Does not apply, Once, Toberman, Stevi W, NP   0.9 %  sodium chloride infusion, , Intravenous, Continuous, Toberman, Stevi W, NP   acetaminophen (TYLENOL) tablet 650 mg, 650 mg, Oral, Q4H PRN **OR** acetaminophen (TYLENOL) 160 MG/5ML solution 650 mg, 650 mg, Per Tube, Q4H PRN **OR** acetaminophen (TYLENOL) suppository 650 mg, 650 mg, Rectal, Q4H PRN, Rikki Spearing, NP   clevidipine (CLEVIPREX) infusion 0.5 mg/mL, 0-32 mg/hr, Intravenous, Continuous, Nanavati, Ankit, MD,  Last Rate: 2 mL/hr at 08/28/21 1818, 1 mg/hr at 08/28/21 1818   pantoprazole (PROTONIX) injection 40 mg, 40 mg, Intravenous, QHS, Toberman, Stevi W, NP   senna-docusate (Senokot-S) tablet 1 tablet, 1 tablet, Oral, QHS PRN, Rikki Spearing, NP   sodium chloride flush (NS) 0.9 % injection 3 mL, 3 mL, Intravenous, Once, Davonna Belling, MD  Current Outpatient Medications:    ALPRAZolam (XANAX) 0.25 MG tablet, Take 0.125 mg by mouth 4 (four) times daily. , Disp: , Rfl:    Cholecalciferol (VITAMIN D3) 1000 UNITS CAPS, Take 1,000 Units by mouth daily. , Disp: , Rfl:    docusate sodium (COLACE) 100 MG capsule, Take 100 mg by mouth daily as needed for mild constipation (constipation)., Disp: , Rfl:    ELDERBERRY PO, Take 5 mLs by mouth. "boost immune system", Disp: , Rfl:    feeding supplement, GLUCERNA SHAKE, (GLUCERNA SHAKE) LIQD, Take 237 mLs by mouth daily., Disp: , Rfl:    Multiple Vitamin (MULTIVITAMIN WITH MINERALS) TABS tablet, Take 1 tablet by mouth every other day. , Disp: , Rfl:    polyethylene glycol (MIRALAX / GLYCOLAX) packet, Take 17 g by mouth daily as needed for mild constipation (constipation)., Disp: , Rfl:    simvastatin (ZOCOR) 20 MG tablet, Take 20 mg by mouth every evening., Disp: , Rfl:    SYNTHROID 88 MCG tablet, Take 88 mcg by mouth daily before breakfast. , Disp: , Rfl:   Exam: Current vital signs: BP (!) 142/59   Pulse (!) 58   Temp 98.2 F (36.8 C)   Resp (!) 22   Ht 5\' 4"  (1.626 m)   Wt 62.9 kg   SpO2 98%   BMI 23.80 kg/m  Vital signs in last 24 hours: Temp:  [98.1 F (36.7 C)-98.2 F (36.8 C)] 98.2 F (36.8 C) (09/29 1810) Pulse Rate:  [51-60] 58 (09/29 1815) Resp:  [12-22] 22 (09/29 1815) BP: (142-190)/(59-99) 142/59 (09/29 1815) SpO2:  [98 %] 98 % (09/29 1810) Weight:  [62.9 kg] 62.9 kg (09/29 1809)  GENERAL: Awake, alert, in no acute distress Psych: Patient appears anxious but she is cooperative with examination Head: Normocephalic and  atraumatic, without obvious abnormality EENT: Normal conjunctivae, dry mucous membranes, no OP obstruction LUNGS: Normal respiratory effort. Non-labored breathing on room air CV: Regular rate and rhythm on telemetry ABDOMEN: Soft, non-tender, non-distended Extremities: warm, well perfused, without obvious deformity  NEURO:  Mental Status: Awake, alert, and oriented to person and is able to provide some details regarding her presentation though her speech is severely dysarthric and mostly unintelligible.  She states her age correctly but incorrectly states that the current month is July.  She also provides a different timeline of events than family who were with her today prior to the onset of symptoms.  Speech/Language: speech is significantly dysarthric and at times is unintelligible.   Naming and  repetition are intact without evidence of aphasia. No neglect is noted Cranial Nerves:  II: PERRL 3 mm/brisk. Visual fields full.  III, IV, VI: EOMI without gaze preference or nystagmus.  V: Sensation is intact to light touch and symmetrical to face.  VII: She has a partial left facial palsy VIII: Hearing is intact to voice IX, X: Palate elevation is symmetric. Phonation normal.  XI: Normal sternocleidomastoid and trapezius muscle strength XII: Tongue protrudes midline without fasciculations.   Motor: 5/5 strength is all muscle groups without vertical drift.   Tone is normal. Bulk is normal.  Sensation: Patients reporting varies and she may not be reporting reliably. When asked if her sensation is the same to light touch on BUE and BLE she states "I think so". Coordination: FTN intact bilaterally. HKS intact bilaterally.   DTRs: 2+ and symmetric biceps and patellae Plantars: Toes downgoing bilaterally  Gait: Deferred  NIHSS: 1a Level of Conscious.: 0 1b LOC Questions: 1 1c LOC Commands: 0 2 Best Gaze: 0 3 Visual: 0 4 Facial Palsy: 2 5a Motor Arm - left: 0 5b Motor Arm - Right: 0 6a  Motor Leg - Left: 0 6b Motor Leg - Right: 0 7 Limb Ataxia: 0 8 Sensory: 0 9 Best Language: 0 10 Dysarthria: 2 11 Extinct. and Inatten.: 0 TOTAL: 5  Labs I have reviewed labs in epic and the results pertinent to this consultation are: CBC    Component Value Date/Time   WBC 5.6 08/28/2021 1720   RBC 4.58 08/28/2021 1720   HGB 15.0 08/28/2021 1728   HGB 14.7 07/24/2015 1044   HCT 44.0 08/28/2021 1728   HCT 44.8 07/24/2015 1044   PLT 131 (L) 08/28/2021 1720   PLT 165 07/24/2015 1044   MCV 95.9 08/28/2021 1720   MCV 93.4 07/24/2015 1044   MCH 31.4 08/28/2021 1720   MCHC 32.8 08/28/2021 1720   RDW 12.2 08/28/2021 1720   RDW 13.1 07/24/2015 1044   LYMPHSABS 1.8 08/28/2021 1720   LYMPHSABS 1.9 07/24/2015 1044   MONOABS 0.4 08/28/2021 1720   MONOABS 0.5 07/24/2015 1044   EOSABS 0.1 08/28/2021 1720   EOSABS 0.1 07/24/2015 1044   BASOSABS 0.0 08/28/2021 1720   BASOSABS 0.0 07/24/2015 1044   CMP     Component Value Date/Time   NA 142 08/28/2021 1728   NA 142 07/24/2015 1044   K 3.8 08/28/2021 1728   K 3.7 07/24/2015 1044   CL 106 08/28/2021 1728   CO2 24 08/28/2021 1720   CO2 27 07/24/2015 1044   GLUCOSE 180 (H) 08/28/2021 1728   GLUCOSE 103 07/24/2015 1044   BUN 21 08/28/2021 1728   BUN 25.1 07/24/2015 1044   CREATININE 0.90 08/28/2021 1728   CREATININE 1.0 07/24/2015 1044   CALCIUM 8.8 (L) 08/28/2021 1720   CALCIUM 9.3 07/24/2015 1044   PROT 6.4 (L) 08/28/2021 1720   PROT 7.0 07/24/2015 1044   ALBUMIN 3.4 (L) 08/28/2021 1720   ALBUMIN 3.7 07/24/2015 1044   AST 17 08/28/2021 1720   AST 16 07/24/2015 1044   ALT 13 08/28/2021 1720   ALT 15 07/24/2015 1044   ALKPHOS 49 08/28/2021 1720   ALKPHOS 57 07/24/2015 1044   BILITOT 0.8 08/28/2021 1720   BILITOT 0.66 07/24/2015 1044   GFRNONAA 45 (L) 08/28/2021 1720   GFRAA 65 (L) 11/02/2014 1421   Lipid Panel  No results found for: CHOL, TRIG, HDL, CHOLHDL, VLDL, LDLCALC, LDLDIRECT No results found for:  HGBA1C  Imaging I have reviewed  the images obtained:  CT-scan of the brain 08/28/2021: 1. No evidence of acute large vascular territory infarct or acute hemorrhage. ASPECTS is 10. 2. Similar atrophy and chronic microvascular ischemic disease.  CT angio head and neck 08/28/2021: 1. No large vessel occlusion. 2. Moderate left paraclinoid ICA stenosis. 3. Mild-to-moderate narrowing of the dominant right intradural vertebral artery and mild narrowing of the distal basilar artery. 4. Streak artifact from refluxed venous contrast in the neck obscures the proximal left vertebral artery. 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 The small left vertebral artery in the upper neck is opacified, but irregularly narrowed intradurally, likely terminating as PICA.  Assessment: 87 y.o. female who presented to the ED via EMS 9/29 for evaluation of slurred speech and left facial droop with LKW of 15:00 today. After reviewing the [facility] without evidence of hemorrhage, risks and benefits of TNK therapy were discussed with patient's family who wished to proceed with thrombolytic therapy. Delay in administration due to difficulty getting in touch with family for collateral information and blood pressure management. Patient required labetalol for hypertension prior to TNK.  - Stroke risk factors include patient's advanced age, CKD, HTN, and DM2.   Plan: Acute Ischemic Stroke  Acuity: Acute Current Suspected Etiology: Likely small subcortical Continue Evaluation:  -Admit to: ICU -Hold Aspirin until 24 hour post TNK neuroimaging is stable and without evidence of bleeding -Blood pressure control, goal of SYS < 180/105 -MRI/ECHO/A1C/Lipid panel. -Hyperglycemia management per SSI to maintain glucose 140-180mg /dL. -PT/OT/ST therapies and recommendations when  able  CNS Acute ischemic stroke s/p TNK -Close neuro monitoring  Dysarthria -NPO until cleared by speech -ST -Advance diet as tolerated  RESP Maintain SpO2 > 94% -supplemental oxygen as needed  CV Essential (primary) hypertension Hypertensive Emergency -Aggressive BP control, goal SBP < 180/105 -Cleviprex gtt for blood pressure control  Hyperlipidemia, unspecified  -Lipid panel pending - Statin for goal LDL < 70  HEME AM CBC -Monitor -Transfuse for hgb < 7  ENDO Type 2 diabetes mellitus with hyperglycemia  -SSI -goal HgbA1c < 7%  GI/GU CKD Stage 3 (GFR 30-59) -Gentle hydration -avoid nephrotoxic agents  Fluid/Electrolyte Disorders AM CMP -Replete -Repeat labs -Trend  ID AM CBC -Trend fever and WBC -UA pending -CXR  Prophylaxis DVT:  SCDs GI: PPI Bowel: Docusate / Senna  Diet: NPO until cleared by speech  Code Status: Full Code    THE FOLLOWING WERE PRESENT ON ADMISSION: CNS -  Acute Ischemic Stroke Respiratory - N/A Cardiovascular - Hypertensive Urgency Infectious - N/A GI - History of colon cancer in remission Renal -  CKD Heme-  N/A Cancer - history of colon cancer s/p hemicolectomy, in remission Trauma - N/A  Anibal Henderson, AGAC-NP [facility] Pager: [phone]   I have seen the patient reviewed the above note.  I suspect that she has a small subcortical infarct, no other etiology is possible.  Given that she has a disabling deficit, after discussing with the patient's POA (son-in-law) we made the decision to proceed with IV thrombolysis.  She will need the ICU level monitoring given that she has been started on Cleviprex, therefore is not a candidate for optimist.  This patient is critically ill and at significant risk of neurological worsening, death and care requires constant monitoring of vital signs, hemodynamics,respiratory and cardiac monitoring, neurological assessment, discussion with family, other specialists  and medical  decision making of high complexity. I spent 50 minutes of neurocritical care time  in the care of  this patient. This was time spent independent of any time provided by nurse practitioner or PA.  Roland Rack, MD [facility] [phone]  If 7pm- 7am, please page neurology on call as listed in [place]. 08/28/2021  7:46 PM

## 2021-08-28 NOTE — ED Provider Notes (Signed)
[facility] EMERGENCY DEPARTMENT Provider Note   CSN: [id number] Arrival date & time: 08/28/21  1713     History No chief complaint on file.   Anne [name] is a 87 y.o. female.  HPI This is a 87 year old female with past medical history as listed below who presents with slurred speech.  Patient was reportedly with her granddaughter until around 51 when she was reportedly normal without weakness or slurred speech.  Denies any weakness or difficulty ambulating.  No recent sick symptoms including fever, vomiting, diarrhea, cough, chest pain, shortness of breath.  On further history is difficult to obtain as patient's speech is difficult to understand.  Able to reach family who denies any known history of GI bleeding or intracranial bleeding and denies use of blood thinner medications.    Past Medical History:  Diagnosis Date   Abdominal distension    Abdominal pain    Anxiety    facial twitch and click , twitch lt shoulder  due to anxiety   Arthritis    right first finger   Chronic kidney disease    hx uti 1 mo ago was med tx    Constipation    Depression    GERD (gastroesophageal reflux disease)    Hypothyroidism    Itching    at times, area varies   No pertinent past medical history    OCC PAINS TO ABDOMEN AND CHEST , DR BYERLY PLACED PATIENT ON MED FOR GERD FOR 2 WEEKS   Thyroid disease     Patient Active Problem List   Diagnosis Date Noted   Ischemic stroke ([place]) 08/28/2021   Chest pain 05/18/2017   UTI (urinary tract infection) 07/24/2015   GERD (gastroesophageal reflux disease) 07/24/2015   Cancer of ascending colon ([place]) 12/19/2013   Colon cancer ([place]) 12/14/2013   Cholelithiasis 10/05/2011   Dysuria 09/18/2011    Past Surgical History:  Procedure Laterality Date   ABDOMINAL HYSTERECTOMY     partial - patient does not remember date   APPENDECTOMY  1948   bladder tack     Patient does not remember date   CHOLECYSTECTOMY   12/30/2011   Procedure: LAPAROSCOPIC CHOLECYSTECTOMY WITH INTRAOPERATIVE CHOLANGIOGRAM;  Surgeon: Stark Klein, MD;  Location: [facility];  Service: General;  Laterality: N/A;   colonscopy  01-12-2014   LAPAROSCOPIC RIGHT HEMI COLECTOMY Right 12/19/2013   Procedure: LAPAROSCOPIC RIGHT HEMI COLECTOMY;  Surgeon: Shann Medal, MD;  Location: [facility];  Service: General;  Laterality: Right;   NOSE SURGERY     1970   vericose vein removal     legs. does not remember date of procedure     OB History   No obstetric history on file.     Family History  Problem Relation Age of Onset   Stroke Mother    Cancer Sister        breast   Cancer Brother        lung and thyroid    Social History   Tobacco Use   Smoking status: Never   Smokeless tobacco: Never  Substance Use Topics   Alcohol use: No   Drug use: No    Home Medications Prior to Admission medications   Medication Sig Start Date End Date Taking? Authorizing Provider  ALPRAZolam (XANAX) 0.25 MG tablet Take 0.125 mg by mouth 4 (four) times daily.     [provider]  Cholecalciferol (VITAMIN D3) 1000 UNITS CAPS Take 1,000 Units by mouth daily.  [provider]  docusate sodium (COLACE) 100 MG capsule Take 100 mg by mouth daily as needed for mild constipation (constipation).    [provider]  ELDERBERRY PO Take 5 mLs by mouth. "boost immune system"    [provider]  feeding supplement, GLUCERNA SHAKE, (GLUCERNA SHAKE) LIQD Take 237 mLs by mouth daily.    [provider]  Multiple Vitamin (MULTIVITAMIN WITH MINERALS) TABS tablet Take 1 tablet by mouth every other day.     [provider]  polyethylene glycol (MIRALAX / GLYCOLAX) packet Take 17 g by mouth daily as needed for mild constipation (constipation).    [provider]  simvastatin (ZOCOR) 20 MG tablet Take 20 mg by mouth every evening.    [provider]  SYNTHROID 88 MCG tablet Take 88 mcg by mouth  daily before breakfast.  08/17/11   [provider]    Allergies    Patient has no known allergies.  Review of Systems   Review of Systems  Unable to perform ROS: Other , unintelligible speech.   Physical Exam Updated Vital Signs BP (!) 180/96   Wt 62.9 kg   BMI 23.80 kg/m   Physical Exam Vitals and nursing note reviewed.  Constitutional:      General: She is not in acute distress.    Appearance: She is well-developed.  HENT:     Head: Normocephalic and atraumatic.  Eyes:     Conjunctiva/sclera: Conjunctivae normal.  Cardiovascular:     Rate and Rhythm: Normal rate and regular rhythm.     Heart sounds: No murmur heard. Pulmonary:     Effort: Pulmonary effort is normal. No respiratory distress.     Breath sounds: Normal breath sounds.  Abdominal:     Palpations: Abdomen is soft.     Tenderness: There is no abdominal tenderness.  Musculoskeletal:     Cervical back: Neck supple.  Skin:    General: Skin is warm and dry.  Neurological:     Mental Status: She is alert.     Cranial Nerves: Cranial nerve deficit present.     Sensory: No sensory deficit.     Motor: No weakness.     Coordination: Coordination normal.     Comments: L facial droop with dysarthria     ED Results / Procedures / Treatments   Labs (all labs ordered are listed, but only abnormal results are displayed) Labs Reviewed  CBC - Abnormal; Notable for the following components:      Result Value   Platelets 131 (*)    All other components within normal limits  COMPREHENSIVE METABOLIC PANEL - Abnormal; Notable for the following components:   Glucose, Bld 188 (*)    Creatinine, Ser 1.16 (*)    Calcium 8.8 (*)    Total Protein 6.4 (*)    Albumin 3.4 (*)    GFR, Estimated 45 (*)    All other components within normal limits  CBG MONITORING, ED - Abnormal; Notable for the following components:   Glucose-Capillary 176 (*)    All other components within normal limits  I-STAT CHEM 8, ED -  Abnormal; Notable for the following components:   Glucose, Bld 180 (*)    Calcium, Ion 1.10 (*)    All other components within normal limits  PROTIME-INR  APTT  DIFFERENTIAL  HEMOGLOBIN A1C  LIPID PANEL  CBC  COMPREHENSIVE METABOLIC PANEL  RAPID URINE DRUG SCREEN, HOSP PERFORMED  URINALYSIS, COMPLETE (UACMP) WITH MICROSCOPIC  ETHANOL  CBG MONITORING, ED    EKG None  Radiology CT HEAD CODE STROKE WO CONTRAST  Result Date: 08/28/2021 CLINICAL DATA:  Code stroke.  Neuro deficit, acute, stroke suspected EXAM: CT HEAD WITHOUT CONTRAST TECHNIQUE: Contiguous axial images were obtained from the base of the skull through the vertex without intravenous contrast. COMPARISON:  CT Head September 05, 2019. FINDINGS: Brain: No evidence of acute infarction, hemorrhage, hydrocephalus, extra-axial collection or mass lesion/mass effect. Similar mild patchy white matter hypodensities, nonspecific but compatible with chronic microvascular ischemic disease. Similar moderate atrophy with prominence of the extra-axial spaces. Vascular: No hyperdense vessel identified. Calcific intracranial atherosclerosis. Skull: No acute fracture. Sinuses/Orbits: Visualized sinuses are clear.  Unremarkable orbits. Other: No mastoid effusions. ASPECTS [facility] Stroke Program Early CT Score) total score (0-10 with 10 being normal): 10. IMPRESSION: 1. No evidence of acute large vascular territory infarct or acute hemorrhage. ASPECTS is 10. 2. Similar atrophy and chronic microvascular ischemic disease. Code stroke imaging results were communicated on 08/28/2021 at 5:34 pm to provider Dr. Leonel Ramsay via secure text paging. Electronically Signed   By: Margaretha Sheffield M.D.   On: 08/28/2021 17:35    Procedures Procedures   Medications Ordered in ED Medications  sodium chloride flush (NS) 0.9 % injection 3 mL (has no administration in time range)  clevidipine (CLEVIPREX) infusion 0.5 mg/mL (1 mg/hr Intravenous New Bag/Given 08/28/21  1753)   stroke: mapping our early stages of recovery book (has no administration in time range)  0.9 %  sodium chloride infusion (has no administration in time range)  acetaminophen (TYLENOL) tablet 650 mg (has no administration in time range)    Or  acetaminophen (TYLENOL) 160 MG/5ML solution 650 mg (has no administration in time range)    Or  acetaminophen (TYLENOL) suppository 650 mg (has no administration in time range)  senna-docusate (Senokot-S) tablet 1 tablet (has no administration in time range)  pantoprazole (PROTONIX) injection 40 mg (has no administration in time range)  tenecteplase (TNKASE) injection for Stroke 16 mg (16 mg Intravenous Given 08/28/21 1739)  iohexol (OMNIPAQUE) 350 MG/ML injection 75 mL (75 mLs Intravenous Contrast Given 08/28/21 1734)  labetalol (NORMODYNE) injection 10 mg (10 mg Intravenous Given 08/28/21 1738)    ED Course  I have reviewed the triage vital signs and the nursing notes.  Pertinent labs & imaging results that were available during my care of the patient were reviewed by me and considered in my medical decision making (see chart for details).    MDM Rules/Calculators/A&P                          On arrival patient is hemodynamically stable and well-appearing.  Neurology at the bedside on arrival for tier 1 code stroke.  Has left-sided facial droop with dysarthria, but no focal weakness or sensory deficits appreciated.  NIH stroke scale 5.  Low suspicion for large vessel occlusion given this exam.  No evidence of large vessel occlusion on initial CT or CTA. Last known normal less than 3 hours ago.  Patient is a candidate for lytics.  Hypertension to upper 093O systolic on arrival, given 10 mg of labetalol IV with improvement.  Consent given over the phone for lytics by patient's family.  Given tenecteplase.  Patient admitted to neurology ICU for close monitoring. Family en route.   Final Clinical Impression(s) / ED Diagnoses Final diagnoses:   Cerebrovascular accident (CVA) due to occlusion of cerebral artery ([place])    Rx /  DC Orders ED Discharge Orders     None        Coralee Pesa, MD 08/28/21 2056    Davonna Belling, MD 08/28/21 2228

## 2021-08-29 ENCOUNTER — Inpatient Hospital Stay (HOSPITAL_COMMUNITY): Payer: Medicare Other

## 2021-08-29 DIAGNOSIS — I6389 Other cerebral infarction: Secondary | ICD-10-CM | POA: Diagnosis not present

## 2021-08-29 LAB — LIPID PANEL
Cholesterol: 168 mg/dL (ref 0–200)
HDL: 58 mg/dL (ref >40)
LDL Cholesterol: 99 mg/dL (ref 0–99)
Total CHOL/HDL Ratio: 2.9 RATIO
Triglycerides: 57 mg/dL (ref <150)
VLDL: 11 mg/dL (ref 0–40)

## 2021-08-29 LAB — CBC
HCT: 42 % (ref 36.0–46.0)
Hemoglobin: 14.1 g/dL (ref 12.0–15.0)
MCH: 31.5 pg (ref 26.0–34.0)
MCHC: 33.6 g/dL (ref 30.0–36.0)
MCV: 93.8 fL (ref 80.0–100.0)
Platelets: 133 10*3/uL — ABNORMAL LOW (ref 150–400)
RBC: 4.48 MIL/uL (ref 3.87–5.11)
RDW: 12.1 % (ref 11.5–15.5)
WBC: 6.4 10*3/uL (ref 4.0–10.5)
nRBC: 0 % (ref 0.0–0.2)

## 2021-08-29 LAB — COMPREHENSIVE METABOLIC PANEL
ALT: 11 U/L (ref 0–44)
AST: 15 U/L (ref 15–41)
Albumin: 3.2 g/dL — ABNORMAL LOW (ref 3.5–5.0)
Alkaline Phosphatase: 45 U/L (ref 38–126)
Anion gap: 8 (ref 5–15)
BUN: 14 mg/dL (ref 8–23)
CO2: 25 mmol/L (ref 22–32)
Calcium: 8.7 mg/dL — ABNORMAL LOW (ref 8.9–10.3)
Chloride: 109 mmol/L (ref 98–111)
Creatinine, Ser: 0.84 mg/dL (ref 0.44–1.00)
GFR, Estimated: >60 mL/min (ref >60)
Glucose, Bld: 120 mg/dL — ABNORMAL HIGH (ref 70–99)
Potassium: 3.6 mmol/L (ref 3.5–5.1)
Sodium: 142 mmol/L (ref 135–145)
Total Bilirubin: 0.7 mg/dL (ref 0.3–1.2)
Total Protein: 6.1 g/dL — ABNORMAL LOW (ref 6.5–8.1)

## 2021-08-29 LAB — RESP PANEL BY RT-PCR (FLU A&B, COVID) ARPGX2
Influenza A by PCR: NEGATIVE
Influenza B by PCR: NEGATIVE
SARS Coronavirus 2 by RT PCR: NEGATIVE

## 2021-08-29 LAB — GLUCOSE, CAPILLARY
Glucose-Capillary: 119 mg/dL — ABNORMAL HIGH (ref 70–99)
Glucose-Capillary: 156 mg/dL — ABNORMAL HIGH (ref 70–99)

## 2021-08-29 LAB — ECHOCARDIOGRAM COMPLETE
Area-P 1/2: 2.22 cm2
Height: 64 in
MV VTI: 6.95 cm2
P 1/2 time: 872 msec
S' Lateral: 3.4 cm
Weight: 2218.71 oz

## 2021-08-29 LAB — HEMOGLOBIN A1C
Hgb A1c MFr Bld: 5.9 % — ABNORMAL HIGH (ref 4.8–5.6)
Mean Plasma Glucose: 122.63 mg/dL

## 2021-08-29 MED ORDER — ASPIRIN EC 81 MG PO TBEC
81.0000 mg | DELAYED_RELEASE_TABLET | Freq: Every day | ORAL | Status: DC
  Administered 2021-08-29 – 2021-09-03 (×6): 81 mg via ORAL
  Filled 2021-08-29 (×6): qty 1

## 2021-08-29 MED ORDER — SIMVASTATIN 20 MG PO TABS: 20.0000 mg | ORAL_TABLET | Freq: Every day | ORAL | Status: DC

## 2021-08-29 MED ORDER — ONDANSETRON HCL 4 MG/2ML IJ SOLN
4.0000 mg | Freq: Four times a day (QID) | INTRAMUSCULAR | Status: DC | PRN
  Administered 2021-08-29: 4 mg via INTRAVENOUS
  Filled 2021-08-29: qty 2

## 2021-08-29 MED ORDER — LEVOTHYROXINE SODIUM 88 MCG PO TABS
88.0000 ug | ORAL_TABLET | Freq: Every day | ORAL | Status: DC
  Administered 2021-08-30: 88 ug via ORAL
  Filled 2021-08-29: qty 1

## 2021-08-29 MED ORDER — SIMVASTATIN 20 MG PO TABS
40.0000 mg | ORAL_TABLET | Freq: Every day | ORAL | Status: DC
  Administered 2021-08-30: 40 mg via ORAL
  Filled 2021-08-29: qty 2

## 2021-08-29 MED ORDER — ORAL CARE MOUTH RINSE
15.0000 mL | Freq: Two times a day (BID) | OROMUCOSAL | Status: DC
  Administered 2021-08-30 – 2021-09-03 (×8): 15 mL via OROMUCOSAL

## 2021-08-29 MED ORDER — CHLORHEXIDINE GLUCONATE 0.12 % MT SOLN
15.0000 mL | Freq: Two times a day (BID) | OROMUCOSAL | Status: DC
  Administered 2021-08-29 – 2021-09-03 (×10): 15 mL via OROMUCOSAL
  Filled 2021-08-29 (×9): qty 15

## 2021-08-29 MED ORDER — HALOPERIDOL LACTATE 5 MG/ML IJ SOLN
INTRAMUSCULAR | Status: AC
  Filled 2021-08-29: qty 1

## 2021-08-29 MED ORDER — INSULIN ASPART 100 UNIT/ML IJ SOLN
0.0000 [IU] | Freq: Three times a day (TID) | INTRAMUSCULAR | Status: DC
  Administered 2021-08-30 – 2021-08-31 (×3): 1 [IU] via SUBCUTANEOUS
  Administered 2021-09-01: 2 [IU] via SUBCUTANEOUS
  Administered 2021-09-02: 1 [IU] via SUBCUTANEOUS

## 2021-08-29 MED ORDER — HALOPERIDOL LACTATE 5 MG/ML IJ SOLN
0.5000 mg | Freq: Once | INTRAMUSCULAR | Status: AC
  Administered 2021-08-29: 0.5 mg via INTRAVENOUS

## 2021-08-29 NOTE — TOC CAGE-AID Note (Signed)
Transition of Care [facility]) - CAGE-AID Screening   Patient Details  Name: Anne [name] MRN: [id number] Date of Birth: [birth year]  Transition of Care [facility]) CM/SW Contact:    Karlis Cregg C Tarpley-Carter, [place] Phone Number: 08/29/2021, 12:18 PM   Clinical Narrative: Pt is unable to participate in Cage Aid.   Yanixan Mellinger Tarpley-Carter, MSW, LCSW-A Pronouns:  She/Her/Hers [facility] Clinical Social Worker Direct Number:  [phone] [email or web address]   CAGE-AID Screening: Substance Abuse Screening unable to be completed due to: : Patient unable to participate             Substance Abuse Education Offered: No

## 2021-08-29 NOTE — Progress Notes (Signed)
STROKE TEAM PROGRESS NOTE   INTERVAL HISTORY No acute events No visitors at bedside.  VSS with asymptomatic bradycardia (HR 40s-50s), Tmax 98.2, BP moderately elevated overnight to 160s/100s. Cleviprex weaned offf. On RA with oxygen saturations high 90s.   Today she is alert and interactive. Speaking with examiners with dysarthric but mostly comprehensible speech. She is following commands. No new concerns.   We discussed her history, ongoing work up and plan of care. Her questions were answered.  CT head showed no acute abnormalities.  CT angiogram showed no large central stenosis or occlusion.  LDL cholesterol 99 mg percent.  Hemoglobin A1c 5.9.  Urine drug screen negative.  MRI and echocardiogram are pending. Vitals:   08/29/21 0600 08/29/21 0700 08/29/21 0728 08/29/21 0800  BP: (!) 163/76 (!) 168/87  (!) 148/82  Pulse: (!) 47 68  (!) 52  Resp: (!) 25 19  (!) 21  Temp:   97.6 F (36.4 C)   TempSrc:   Oral   SpO2: 97% 96%  97%  Weight:      Height:       CBC:  Recent Labs  Lab 08/28/21 1720 08/28/21 1728 08/29/21 0218  WBC 5.6  --  6.4  NEUTROABS 3.4  --   --   HGB 14.4 15.0 14.1  HCT 43.9 44.0 42.0  MCV 95.9  --  93.8  PLT 131*  --  253*   Basic Metabolic Panel:  Recent Labs  Lab 08/28/21 1720 08/28/21 1728 08/29/21 0218  NA 139 142 142  K 3.8 3.8 3.6  CL 108 106 109  CO2 24  --  25  GLUCOSE 188* 180* 120*  BUN 19 21 14   CREATININE 1.16* 0.90 0.84  CALCIUM 8.8*  --  8.7*   Lipid Panel:  Recent Labs  Lab 08/29/21 0218  CHOL 168  TRIG 57  HDL 58  CHOLHDL 2.9  VLDL 11  LDLCALC 99   HgbA1c:  Recent Labs  Lab 08/29/21 0218  HGBA1C 5.9*   Urine Drug Screen:  Recent Labs  Lab 08/28/21 2034  LABOPIA NONE DETECTED  COCAINSCRNUR NONE DETECTED  LABBENZ NONE DETECTED  AMPHETMU NONE DETECTED  THCU NONE DETECTED  LABBARB NONE DETECTED    Alcohol Level  Recent Labs  Lab 08/28/21 1941  ETH <10    IMAGING past 24 hours CT HEAD CODE STROKE WO  CONTRAST  Result Date: 08/28/2021 CLINICAL DATA:  Code stroke.  Neuro deficit, acute, stroke suspected EXAM: CT HEAD WITHOUT CONTRAST TECHNIQUE: Contiguous axial images were obtained from the base of the skull through the vertex without intravenous contrast. COMPARISON:  CT Head September 05, 2019. FINDINGS: Brain: No evidence of acute infarction, hemorrhage, hydrocephalus, extra-axial collection or mass lesion/mass effect. Similar mild patchy white matter hypodensities, nonspecific but compatible with chronic microvascular ischemic disease. Similar moderate atrophy with prominence of the extra-axial spaces. Vascular: No hyperdense vessel identified. Calcific intracranial atherosclerosis. Skull: No acute fracture. Sinuses/Orbits: Visualized sinuses are clear.  Unremarkable orbits. Other: No mastoid effusions. ASPECTS [facility] Stroke Program Early CT Score) total score (0-10 with 10 being normal): 10. IMPRESSION: 1. No evidence of acute large vascular territory infarct or acute hemorrhage. ASPECTS is 10. 2. Similar atrophy and chronic microvascular ischemic disease. Code stroke imaging results were communicated on 08/28/2021 at 5:34 pm to provider Dr. Leonel Ramsay via secure text paging. Electronically Signed   By: Margaretha Sheffield M.D.   On: 08/28/2021 17:35   CT ANGIO HEAD NECK W WO CM (CODE STROKE)  Result Date: 08/28/2021 CLINICAL DATA:  Focal neuro deficit, > 6 hrs, stroke suspected EXAM: CT ANGIOGRAPHY HEAD AND NECK TECHNIQUE: Multidetector CT imaging of the head and neck was performed using the standard protocol during bolus administration of intravenous contrast. Multiplanar CT image reconstructions and MIPs were obtained to evaluate the vascular anatomy. Carotid stenosis measurements (when applicable) are obtained utilizing NASCET criteria, using the distal internal carotid diameter as the denominator. CONTRAST:  51mL OMNIPAQUE IOHEXOL 350 MG/ML SOLN COMPARISON:  None. FINDINGS: CTA NECK FINDINGS Aortic  arch: Great vessel origins are patent. Right carotid system: No evidence of dissection, stenosis (50% or greater) or occlusion. Mild atherosclerosis at the carotid bifurcation. Tortuous Left carotid system: No evidence of dissection, stenosis (50% or greater) or occlusion. Mild atherosclerosis of the ICA. Tortuous. Vertebral arteries: Streak artifact from refluxed venous contrast in the neck obscures the left vertebral artery proximally. The left transverse foramen is poorly visualized in the right vertebral artery is prominent/dominant. Evaluation of the right vertebral artery is also limited proximally; however, the right vertebral artery remains patent without evidence of high-grade stenosis. Skeleton: Moderate multilevel degenerative change of the spine. Other neck: No acute findings. Upper chest: No consolidation in the visualized lung apices. Review of the MIP images confirms the above findings CTA HEAD FINDINGS Anterior circulation: Calcific atherosclerosis of bilateral intracranial ICAs with moderate stenosis of the left paraclinoid ICA. Bilateral M1 MCAs are patent. Bilateral proximal M2 MCAs are patent. Left A1 ACA is hypoplastic or absent, likely congenital. Otherwise, the ACAs are patent without proximal high-grade stenosis. No aneurysm identified. Posterior circulation: Small/non dominant left vertebral artery, which is narrowed intradurally and appears to terminate as PICA. The right intradural vertebral artery is patent with mild-to-moderate narrowing. Patent basilar artery with mild distal narrowing. Bilateral PCAs are patent without proximal hemodynamically significant stenosis. No aneurysm identified. Venous sinuses: Poorly evaluated due to arterial timing. Anatomic variants: See above Review of the MIP images confirms the above findings IMPRESSION: 1. No large vessel occlusion. 2. Moderate left paraclinoid ICA stenosis. 3. Mild-to-moderate narrowing of the dominant right intradural vertebral  artery and mild narrowing of the distal basilar artery. 4. Streak artifact from refluxed venous contrast in the neck obscures the proximal left vertebral artery. 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 The small left vertebral artery in the upper neck is opacified, but irregularly narrowed intradurally, likely terminating as PICA. Electronically Signed   By: Margaretha Sheffield M.D.   On: 08/28/2021 18:06    PHYSICAL EXAM  Pleasant elderly Caucasian lady not in distress. . Afebrile. Head is nontraumatic. Neck is supple without bruit.    Cardiac exam no murmur or gallop. Lungs are clear to auscultation. Distal pulses are well felt.  Neurological Exam : Awake alert oriented to time place and person.  Severe dysarthria but can be understood with some difficulty.  No aphasia.  Follows commands well.  Extraocular movements are full range without nystagmus.  Blinks to threat bilaterally.  Moderate left lower facial weakness.  Tongue midline.  Motor system exam shows no upper or lower extremity drift verts trace weakness of left grip intrinsic hand muscles and left hip flexors.  Orbits right over left upper extremity.  Sensation is preserved bilaterally.  Reflexes are symmetric.  Plantars are downgoing.  Gait not tested. ASSESSMENT/PLAN Anne [name] is a 87 y.o. female with a medical history significant  for type 2 diabetes mellitus with diabetic chronic kidney disease stage III, depression, GERD, and colon cancer s/p hemicolectomy in 2015 in remission who presented to the ED today via EMS for evaluation of left facial droop and slurred speech.  Patient had family visiting with her today and she was at her baseline status when they left just after 15:00. At 15:30 patient's husband noted that Anne [name] had slurred speech and left-sided facial droop. He called  his family that had just left from visiting to come and check on her and EMS was subsequently activated. CBG for EMS was 272 and patient was hypertensive with a systolic blood pressure in the 190's. Patient was able to communicate with providers that she has been feeling dizzy lately and has been under an increased amount of stress. BP was treated. CTH without evidence of hemorrhage, risks and benefits of treatment were discussed with patient and family and TNK was administered at 1739. No LVO was identified. She was admitted to the neurologic ICU for pos thrombolytic care and further work up.   Suspect right brain subcortical infarct due to small vessel disease.  Treated with IV thrombolysis TNK with mild improvement Code Stroke CT head  No acute abnormality. CTA head & neck  1. No large vessel occlusion. 2. Moderate left paraclinoid ICA stenosis. 3. Mild-to-moderate narrowing of the dominant right intradural vertebral artery and mild narrowing of the distal basilar artery. 4. Streak artifact from refluxed venous contrast in the neck obscures the proximal left vertebral artery. 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 The small left vertebral artery in the upper neck is opacified, but irregularly narrowed intradurally, likely terminating as PICA. MRI  PENDING 2D Echo PENDING LDL 99 HgbA1c 5.9 VTE prophylaxis - SCDs    Diet   Diet NPO time specified   No anticoagulant or antiplatelet prior to admission Hold anticoagulant and antiplatelet post tNK 24hrs with stable imaging Obtain stat HCT for neurologic decline  Therapy recommendations: Pending Disposition: Pending  Hypertension Home meds:  None  Stable, cleviprex off at present  BP goal less than 180/105 post tNK Long-term BP goal normotensive  Hyperlipidemia Home meds: Zocor 20mg   LDL  99, goal < 70 High intensity statin increase home dose of Zocor to 40 mg Continue statin at discharge        Diabetes type II Controlled Home meds:  None  HgbA1c 5.9, goal < 7.0 CBGs Recent Labs    08/28/21 1719  GLUCAP 176*    SSI protocol        Asymptomatic bradycardia Continue cardiac monitoring       CKD Cre 1.16->0.84 Monitor  Daily labs   Other Stroke Risk Factors Advanced Age >/= 49  Family hx stroke (Mother)  Other Active Problems Hypothyroidism, continue home dose of synthroid   Hospital day # 1  This patient was seen and evaluated with Dr. Leonie Man. He directed the plan of care.  Charlene Brooke, NP-C   STROKE MD NOTE : I have personally obtained history,examined this patient, reviewed notes, independently viewed imaging studies, participated in medical decision making and plan of care.ROS completed by me personally and pertinent positives fully documented  I have made any additions or clarifications directly to the above note. Agree with note above.  Patient presented with sudden onset of dysarthria and left facial weakness due to suspected right subcortical infarct  and received IV thrombolysis with only mild improvement.  Continue close neurological monitoring and strict blood pressure control as per postthrombolytic protocol.  Check MRI scan of the brain later today.  Speech therapy for swallow eval.  Mobilize out of bed.  Therapy consults.  Continue ongoing stroke work-up and aggressive risk factor modification.  No family available at the bedside for discussion. This patient is critically ill and at significant risk of neurological worsening, death and care requires constant monitoring of vital signs, hemodynamics,respiratory and cardiac monitoring, extensive review of multiple databases, frequent neurological assessment, discussion with family, other specialists and medical decision making of high complexity.I have made any additions or clarifications directly  to the above note.This critical care time does not reflect procedure time, or teaching time or supervisory time of PA/NP/Med Resident etc but could involve care discussion time.  I spent 30 minutes of neurocritical care time  in the care of  this patient.      Antony Contras, MD Medical Director [place] Pager: [phone] 08/29/2021 3:18 PM   To contact Stroke Continuity provider, please refer to [email or web address] After hours, contact General Neurology

## 2021-08-29 NOTE — Evaluation (Signed)
Speech Language Pathology Evaluation Patient Details Name: Anne [name] MRN: [id number] DOB: 03-15-1929 Today's Date: 08/29/2021 Time: 8657-8469 SLP Time Calculation (min) (ACUTE ONLY): 11 min  Problem List:  Patient Active Problem List   Diagnosis Date Noted   Ischemic stroke ([place]) 08/28/2021   Chest pain 05/18/2017   UTI (urinary tract infection) 07/24/2015   GERD (gastroesophageal reflux disease) 07/24/2015   Cancer of ascending colon ([place]) 12/19/2013   Colon cancer ([place]) 12/14/2013   Cholelithiasis 10/05/2011   Dysuria 09/18/2011   Past Medical History:  Past Medical History:  Diagnosis Date   Abdominal distension    Abdominal pain    Anxiety    facial twitch and click , twitch lt shoulder  due to anxiety   Arthritis    right first finger   Chronic kidney disease    hx uti 1 mo ago was med tx    Constipation    Depression    GERD (gastroesophageal reflux disease)    Hypothyroidism    Itching    at times, area varies   No pertinent past medical history    OCC PAINS TO ABDOMEN AND CHEST , DR BYERLY PLACED PATIENT ON MED FOR GERD FOR 2 WEEKS   Thyroid disease    Past Surgical History:  Past Surgical History:  Procedure Laterality Date   ABDOMINAL HYSTERECTOMY     partial - patient does not remember date   APPENDECTOMY  1948   bladder tack     Patient does not remember date   CHOLECYSTECTOMY  12/30/2011   Procedure: LAPAROSCOPIC CHOLECYSTECTOMY WITH INTRAOPERATIVE CHOLANGIOGRAM;  Surgeon: Stark Klein, MD;  Location: [facility];  Service: General;  Laterality: N/A;   colonscopy  01-12-2014   LAPAROSCOPIC RIGHT HEMI COLECTOMY Right 12/19/2013   Procedure: LAPAROSCOPIC RIGHT HEMI COLECTOMY;  Surgeon: Shann Medal, MD;  Location: [facility];  Service: General;  Laterality: Right;   NOSE SURGERY     1970   vericose vein removal     legs. does not remember date of procedure   HPI:  87 year old female with past medical history GERD colon cancer. CT no evidence of  infarct or hemorrhage admitted with slurred speech. MRI pending.   Assessment / Plan / Recommendation Clinical Impression  Family not present to report prior function. Pt states her husband "does everything" but manage her meds. She exhibited mild anticipatory awareness stating she may need to begin using a pill box. Does not keep up with day week or year but accurately oriented to month, person and situation. Exhibits mainly decreased storage of new information unable to state with choices 3/4 with 1/4 correct with choice cues. Receptive and expressive language is appropriate and her speech is dysarthric with 75% intelligibility in conversation. ST suspects prior cognitive decline per responses and atrophy noted on CT. Therapist will focus on intelligibility and swallow on acute and likely not needed at discharge.    SLP Assessment  SLP Recommendation/Assessment: Patient needs continued Speech [name] Services SLP Visit Diagnosis: Dysarthria and anarthria (R47.1)    Recommendations for follow up therapy are one component of a multi-disciplinary discharge planning process, led by the attending physician.  Recommendations may be updated based on patient status, additional functional criteria and insurance authorization.    Follow Up Recommendations  None    Frequency and Duration min 1 x/week  2 weeks      SLP Evaluation Cognition  Overall Cognitive Status: No family/caregiver present to determine baseline cognitive functioning Arousal/Alertness: Awake/alert Orientation  Level: Oriented to person;Oriented to place;Oriented to situation Attention: Sustained Sustained Attention: Appears intact Memory: Impaired Memory Impairment: Storage deficit;Retrieval deficit (mostly storage) Awareness: Impaired Awareness Impairment: Intellectual impairment;Emergent impairment;Anticipatory impairment Problem Solving:  (will assess further) Safety/Judgment: Impaired       Comprehension   Auditory Comprehension Overall Auditory Comprehension: Appears within functional limits for tasks assessed Visual Recognition/Discrimination Discrimination: Not tested    Expression Expression Primary Mode of Expression: Verbal Verbal Expression Overall Verbal Expression: Appears within functional limits for tasks assessed Written Expression Dominant Hand: Right Written Expression: Not tested   Oral / Motor  Oral Motor/Sensory Function Overall Oral Motor/Sensory Function: Moderate impairment Facial ROM: Reduced left;Suspected CN VII (facial) dysfunction Facial Symmetry: Abnormal symmetry left;Suspected CN VII (facial) dysfunction Facial Strength: Reduced left;Suspected CN VII (facial) dysfunction Facial Sensation:  (pt states the same) Lingual ROM: Within Functional Limits Lingual Symmetry: Within Functional Limits Velum: Within Functional Limits Mandible: Within Functional Limits Motor Speech Overall Motor Speech: Impaired Respiration: Within functional limits Phonation: Low vocal intensity Resonance: Within functional limits Articulation: Impaired Intelligibility: Intelligibility reduced Word: 75-100% accurate Phrase: 50-74% accurate Sentence: 50-74% accurate Conversation: 50-74% accurate Motor Planning: Witnin functional limits   GO                    Houston Siren 08/29/2021, 10:46 AM

## 2021-08-29 NOTE — Progress Notes (Signed)
  Echocardiogram 2D Echocardiogram has been performed.  Anne [name] 08/29/2021, 12:47 PM

## 2021-08-29 NOTE — Evaluation (Addendum)
Physical Therapy Evaluation Patient Details Name: Anne [name] MRN: [id number] DOB: 13-Nov-1929 Today's Date: 08/29/2021  History of Present Illness  87 yo female admitted with CT no evidence of infarct or hemorrhage admitted with slurred speech hypertension and hyperglycemia.  She had CT negative for bleed so now s/p TNK. MRI pending. PMH GERD colon CA  Clinical Impression  Patient presents with decreased mobility due to decreased balance, decreased L side strength, decreased awareness of deficits and decreased safety with poor activity tolerance.  Currently min to mod A with +2 for safety with lines.  She has inconsistent reports, but may not have been super active PTA though not using an assistive device per her report.  Feel she may need STSNF level rehab at d/c.        Recommendations for follow up therapy are one component of a multi-disciplinary discharge planning process, led by the attending physician.  Recommendations may be updated based on patient status, additional functional criteria and insurance authorization.  Follow Up Recommendations SNF    Equipment Recommendations  None recommended by PT    Recommendations for Other Services       Precautions / Restrictions Precautions Precautions: Fall      Mobility  Bed Mobility Overal bed mobility: Needs Assistance Bed Mobility: Rolling;Supine to Sit;Sit to Supine Rolling: Min assist   Supine to sit: Min assist     General bed mobility comments: pt requires encouragement and cues to progress to Left side of the bed. pt with (A) to elevate trunk from surface. pt static sitting min guard (A) Pt requires (A) To lift bil LE back on bed surface    Transfers Overall transfer level: Needs assistance Equipment used: Rolling walker (2 wheeled) Transfers: Sit to/from Stand Sit to Stand: Mod assist         General transfer comment: pt requires use of arm to power up from surfaces  Ambulation/Gait Ambulation/Gait  assistance: Min assist;+2 safety/equipment;Mod assist Gait Distance (Feet): 6 Feet Assistive device: Rolling walker (2 wheeled) Gait Pattern/deviations: Step-to pattern;Decreased stride length;Shuffle;Decreased dorsiflexion - left     General Gait Details: dragging L leg at time, posterior bias at times with RW needing increased assist,  +2 for IV for safety  Stairs            Wheelchair Mobility    Modified Rankin (Stroke Patients Only) Modified Rankin (Stroke Patients Only) Pre-Morbid Rankin Score: Slight disability Modified Rankin: Moderately severe disability     Balance Overall balance assessment: Needs assistance   Sitting balance-Leahy Scale: Fair Sitting balance - Comments: at EOB and on toilet, able to perform toilet hyiene with S   Standing balance support: No upper extremity supported Standing balance-Leahy Scale: Poor Standing balance comment: washing hands at sink with A at hips to prevent posterior LOB                             Pertinent Vitals/Pain Pain Assessment: No/denies pain    Home Living Family/patient expects to be discharged to:: Private residence Living Arrangements: Spouse/significant other Available Help at Discharge: Family Type of Home: House Home Access: Stairs to enter Entrance Stairs-Rails: Left Entrance Stairs-Number of Steps: 4 Home Layout: Two level Home Equipment: Walker - 2 wheels      Prior Function Level of Independence: Independent         Comments: steps over to shower in the tub occasionally, sponge bathes usually     Hand  Dominance   Dominant Hand: Right    Extremity/Trunk Assessment   Upper Extremity Assessment Upper Extremity Assessment: Defer to OT evaluation LUE Deficits / Details: weaker than R side3    Lower Extremity Assessment Lower Extremity Assessment: RLE deficits/detail;LLE deficits/detail RLE Deficits / Details: AROM WFL, strength at least 4/5 LLE Deficits / Details: AROM  WFL, strength hip flexion 3+/5, knee extension 4-/5, ankle DF 4-/5 LLE Sensation: decreased light touch LLE Coordination: decreased gross motor    Cervical / Trunk Assessment Cervical / Trunk Assessment: Kyphotic  Communication   Communication: Expressive difficulties (dysarthria)  Cognition Arousal/Alertness: Awake/alert Behavior During Therapy: WFL for tasks assessed/performed Overall Cognitive Status: No family/caregiver present to determine baseline cognitive functioning                                 General Comments: pt impulsive a few times during session. standing from commode and insisting on return to bed states "i am tired"      General Comments General comments (skin integrity, edema, etc.): BP decreased to 106/89 pt c/o dizziness throughout (143/101 initially)    Exercises     Assessment/Plan    PT Assessment Patient needs continued PT services  PT Problem List Decreased strength;Decreased mobility;Decreased safety awareness;Decreased knowledge of precautions;Decreased balance;Decreased activity tolerance;Decreased cognition;Decreased knowledge of use of DME;Decreased coordination       PT Treatment Interventions DME instruction;Therapeutic activities;Gait training;Therapeutic exercise;Patient/family education;Balance training;Neuromuscular re-education;Functional mobility training    PT Goals (Current goals can be found in the Care Plan section)  Acute Rehab PT Goals Patient Stated Goal: to get back in the bed PT Goal Formulation: Patient unable to participate in goal setting Time For Goal Achievement: 09/12/21 Potential to Achieve Goals: Fair    Frequency Min 3X/week   Barriers to discharge        Co-evaluation PT/OT/SLP Co-Evaluation/Treatment: Yes Reason for Co-Treatment: For patient/therapist safety;To address functional/ADL transfers PT goals addressed during session: Mobility/safety with mobility;Balance;Proper use of DME OT goals  addressed during session: ADL's and self-care;Proper use of Adaptive equipment and DME;Strengthening/ROM       AM-PAC PT "6 Clicks" Mobility  Outcome Measure Help needed turning from your back to your side while in a flat bed without using bedrails?: A Little Help needed moving from lying on your back to sitting on the side of a flat bed without using bedrails?: A Lot Help needed moving to and from a bed to a chair (including a wheelchair)?: A Little Help needed standing up from a chair using your arms (e.g., wheelchair or bedside chair)?: A Little Help needed to walk in hospital room?: A Lot Help needed climbing 3-5 steps with a railing? : Total 6 Click Score: 14    End of Session Equipment Utilized During Treatment: Gait belt Activity Tolerance: Patient limited by fatigue Patient left: in bed;with call bell/phone within reach Nurse Communication: Other (comment) (IV leaking) PT Visit Diagnosis: Other abnormalities of gait and mobility (R26.89);Muscle weakness (generalized) (M62.81);Hemiplegia and hemiparesis;Other symptoms and signs involving the nervous system (R29.898) Hemiplegia - Right/Left: Left Hemiplegia - dominant/non-dominant: Non-dominant Hemiplegia - caused by: Cerebral infarction    Time: 1400-1431 PT Time Calculation (min) (ACUTE ONLY): 31 min   Charges:   PT Evaluation $PT Eval Moderate Complexity: 1 Mod          Cyndi Maelys Kinnick, PT Acute Rehabilitation Services Pager:[phone] Office:[phone] 08/29/2021   Reginia Naas 08/29/2021, 5:35 PM

## 2021-08-29 NOTE — Progress Notes (Signed)
OT Cancellation Note  Patient Details Name: Anne [name] MRN: [id number] DOB: 03/02/1929   Cancelled Treatment:    Reason Eval/Treat Not Completed: Active bedrest order OT order received and appreciated however this conflicts with current bedrest order set. Please increase activity tolerance as appropriate and remove bedrest from orders. . Please contact OT at [phone] if bed rest order is discontinued. OT will hold evaluation at this time and will check back as time allows pending increased activity orders.   Billey Chang, OTR/L  Acute Rehabilitation Services Pager: [phone] Office: [phone] .  08/29/2021, 8:47 AM

## 2021-08-29 NOTE — Evaluation (Signed)
Clinical/Bedside Swallow Evaluation Patient Details  Name: Anne [name] MRN: [id number] Date of Birth: [birth year]  Today's Date: 08/29/2021 Time: SLP Start Time (ACUTE ONLY): 0941 SLP Stop Time (ACUTE ONLY): 1005 SLP Time Calculation (min) (ACUTE ONLY): 12 min  Past Medical History:  Past Medical History:  Diagnosis Date   Abdominal distension    Abdominal pain    Anxiety    facial twitch and click , twitch lt shoulder  due to anxiety   Arthritis    right first finger   Chronic kidney disease    hx uti 1 mo ago was med tx    Constipation    Depression    GERD (gastroesophageal reflux disease)    Hypothyroidism    Itching    at times, area varies   No pertinent past medical history    OCC PAINS TO ABDOMEN AND CHEST , DR BYERLY PLACED PATIENT ON MED FOR GERD FOR 2 WEEKS   Thyroid disease    Past Surgical History:  Past Surgical History:  Procedure Laterality Date   ABDOMINAL HYSTERECTOMY     partial - patient does not remember date   APPENDECTOMY  1948   bladder tack     Patient does not remember date   CHOLECYSTECTOMY  12/30/2011   Procedure: LAPAROSCOPIC CHOLECYSTECTOMY WITH INTRAOPERATIVE CHOLANGIOGRAM;  Surgeon: Stark Klein, MD;  Location: [place] [facility];  Service: General;  Laterality: N/A;   colonscopy  01-12-2014   LAPAROSCOPIC RIGHT HEMI COLECTOMY Right 12/19/2013   Procedure: LAPAROSCOPIC RIGHT HEMI COLECTOMY;  Surgeon: Shann Medal, MD;  Location: [facility];  Service: General;  Laterality: Right;   NOSE SURGERY     1970   vericose vein removal     legs. does not remember date of procedure   HPI:  87 year old female with past medical history GERD colon cancer. CT no evidence of infarct or hemorrhage admitted with slurred speech. MRI pending.    Assessment / Plan / Recommendation  Clinical Impression  Pt reported "choking with food" occasionally prior to admission and has history of GERD. There were subtle indications of possible airway intrusion with throat  clearing x 1 with thin. Suspect dual consistencies in oral cavity may be problematic and not coordinated. She had decreased sensation and oral manipulation of left. Recommend Dys 3 texture, thin liquids, check for pocketing on left, throat clearing during meals and pills whole in puree. ST will follow up. SLP Visit Diagnosis: Dysphagia, unspecified (R13.10)    Aspiration Risk  Mild aspiration risk;Moderate aspiration risk    Diet Recommendation Dysphagia 3 (Mech soft);Thin liquid   Liquid Administration via: Straw;Cup Medication Administration: Whole meds with puree Supervision: Intermittent supervision to cue for compensatory strategies Compensations: Slow rate;Small sips/bites Postural Changes: Seated upright at 90 degrees;Remain upright for at least 30 minutes after po intake    Other  Recommendations Oral Care Recommendations: Oral care BID    Recommendations for follow up therapy are one component of a multi-disciplinary discharge planning process, led by the attending physician.  Recommendations may be updated based on patient status, additional functional criteria and insurance authorization.  Follow up Recommendations  (TBD)      Frequency and Duration min 2x/week  2 weeks       Prognosis Prognosis for Safe Diet Advancement: Good      Swallow Study   General Date of Onset: 08/29/21 HPI: 87 year old female with past medical history GERD colon cancer. CT no evidence of infarct or hemorrhage admitted with slurred speech. MRI  pending. Type of Study: Bedside Swallow Evaluation Previous Swallow Assessment: none Diet Prior to this Study: NPO Temperature Spikes Noted: No Respiratory Status: Room air History of Recent Intubation: No Behavior/Cognition: Alert;Cooperative;Pleasant mood Oral Cavity Assessment: Within Functional Limits Oral Care Completed by SLP: No Oral Cavity - Dentition: Other (Comment);Adequate natural dentition (has a bridge) Vision: Functional for  self-feeding Self-Feeding Abilities: Needs set up Patient Positioning: Upright in bed Baseline Vocal Quality: Low vocal intensity Volitional Cough: Strong Volitional Swallow: Able to elicit    Oral/Motor/Sensory Function Overall Oral Motor/Sensory Function: Moderate impairment Facial ROM: Reduced left;Suspected CN VII (facial) dysfunction Facial Symmetry: Abnormal symmetry left;Suspected CN VII (facial) dysfunction Facial Strength: Reduced left;Suspected CN VII (facial) dysfunction Facial Sensation:  (pt states the same) Lingual ROM: Within Functional Limits Lingual Symmetry: Within Functional Limits Velum: Within Functional Limits Mandible: Within Functional Limits   Ice Chips Ice chips: Not tested   Thin Liquid Thin Liquid: Impaired Presentation: Cup Oral Phase Impairments: Reduced labial seal Oral Phase Functional Implications: Left anterior spillage Pharyngeal  Phase Impairments: Throat Clearing - Immediate    Nectar Thick Nectar Thick Liquid: Not tested   Honey Thick Honey Thick Liquid: Not tested   Puree Puree: Impaired Oral Phase Functional Implications: Oral residue (labial residue left)   Solid     Solid: Impaired Oral Phase Impairments: Reduced lingual movement/coordination Oral Phase Functional Implications: Impaired mastication      Mick Sell, Orbie Pyo 08/29/2021,10:27 AM

## 2021-08-29 NOTE — Evaluation (Signed)
Occupational Therapy Evaluation Patient Details Name: Anne [name] MRN: [id number] DOB: 1929-04-12 Today's Date: 08/29/2021   History of Present Illness 87 yo female admitted with CT no evidence of infarct or hemorrhage admitted with slurred speech. MRI pending. PMH GERD colon CA   Clinical Impression   PT admitted with slurred speech pending MRI workup. Pt currently with functional limitiations due to the deficits listed below (see OT problem list). Pt currently with balance deficits, expressive language deficits ( slurred speech) and L side weakness. Pt reports fatigue and need to rest. Pt unable to sustain oob to chair after bathroom transfer and pulling covers off to return to bed despite educated to remain up in chair until mri.  Pt will benefit from skilled OT to increase their independence and safety with adls and balance to allow discharge SNF.       Recommendations for follow up therapy are one component of a multi-disciplinary discharge planning process, led by the attending physician.  Recommendations may be updated based on patient status, additional functional criteria and insurance authorization.   Follow Up Recommendations  SNF    Equipment Recommendations  3 in 1 bedside commode    Recommendations for Other Services       Precautions / Restrictions Precautions Precautions: Fall      Mobility Bed Mobility Overal bed mobility: Needs Assistance Bed Mobility: Rolling;Supine to Sit;Sit to Supine Rolling: Min assist   Supine to sit: Min assist     General bed mobility comments: pt requires encouragement and cues to progress to Left side of the bed. pt with (A) to elevate trunk from surface. pt static sitting min guard (A) Pt requires (A) To lift bil LE back on bed surface    Transfers Overall transfer level: Needs assistance Equipment used: Rolling walker (2 wheeled) Transfers: Sit to/from Stand Sit to Stand: Mod assist         General transfer  comment: pt requires use of arm to power up from surfaces    Balance Overall balance assessment: Mild deficits observed, not formally tested                                         ADL either performed or assessed with clinical judgement   ADL Overall ADL's : Needs assistance/impaired Eating/Feeding: Minimal assistance   Grooming: Minimal assistance;Standing Grooming Details (indicate cue type and reason): does not use soap even when directed Upper Body Bathing: Minimal assistance   Lower Body Bathing: Maximal assistance   Upper Body Dressing : Minimal assistance   Lower Body Dressing: Maximal assistance   Toilet Transfer: Moderate assistance;BSC Toilet Transfer Details (indicate cue type and reason): pt requires arm rest and grab bars Toileting- Clothing Manipulation and Hygiene: Moderate assistance       Functional mobility during ADLs: Moderate assistance;Rolling walker General ADL Comments: pt reports fatigue and needing to rest. pt expressed feeling anxious about MRI     Vision Baseline Vision/History: 1 Wears glasses Ability to See in Adequate Light:  (no glasses present and reports my eyes my husband didnt bring my glasses)       Perception     Praxis      Pertinent Vitals/Pain Pain Assessment: No/denies pain     Hand Dominance Right   Extremity/Trunk Assessment Upper Extremity Assessment Upper Extremity Assessment: Generalized weakness;LUE deficits/detail LUE Deficits / Details: weaker than R side3  Lower Extremity Assessment Lower Extremity Assessment: Defer to PT evaluation   Cervical / Trunk Assessment Cervical / Trunk Assessment: Kyphotic   Communication Communication Communication: Other (comment) (slurred speech)   Cognition Arousal/Alertness: Awake/alert Behavior During Therapy: WFL for tasks assessed/performed Overall Cognitive Status: No family/caregiver present to determine baseline cognitive functioning                                  General Comments: pt impulsive a few times during session. standing from commode and insisting on return to be stands "i am tired"   General Comments  BP noted to decreased 106/89 from 143/101 - question orthostatic    Exercises     Shoulder Instructions      Home Living Family/patient expects to be discharged to:: Private residence Living Arrangements: Spouse/significant other Available Help at Discharge: Family Type of Home: House Home Access: Stairs to enter Technical brewer of Steps: 4 Entrance Stairs-Rails: Left Home Layout: Two level Alternate Level Stairs-Number of Steps: 15 Alternate Level Stairs-Rails: Left Bathroom Shower/Tub: Teacher, early years/pre: Standard     Home Equipment: Cane - single point          Prior Functioning/Environment Level of Independence: Independent        Comments: gets into the tub and then washing in the tub. sponge bath on occasion. spouse uses a cane        OT Problem List: Decreased activity tolerance;Decreased strength;Impaired balance (sitting and/or standing);Decreased coordination;Decreased cognition;Decreased safety awareness;Decreased knowledge of precautions;Decreased knowledge of use of DME or AE;Impaired UE functional use      OT Treatment/Interventions: Self-care/ADL training;Therapeutic exercise;DME and/or AE instruction;Energy conservation;Manual therapy;Therapeutic activities;Cognitive remediation/compensation;Visual/perceptual remediation/compensation;Patient/family education;Balance training    OT Goals(Current goals can be found in the care plan section) Acute Rehab OT Goals Patient Stated Goal: to get back in the bed OT Goal Formulation: With patient Time For Goal Achievement: 09/12/21 Potential to Achieve Goals: Good  OT Frequency: Min 2X/week   Barriers to D/C: Decreased caregiver support  spouse 65 yo and daughter 55 yo       Co-evaluation PT/OT/SLP  Co-Evaluation/Treatment: Yes Reason for Co-Treatment: For patient/therapist safety;To address functional/ADL transfers   OT goals addressed during session: ADL's and self-care;Proper use of Adaptive equipment and DME;Strengthening/ROM      AM-PAC OT "6 Clicks" Daily Activity     Outcome Measure Help from another person eating meals?: A Little Help from another person taking care of personal grooming?: A Little Help from another person toileting, which includes using toliet, bedpan, or urinal?: A Little Help from another person bathing (including washing, rinsing, drying)?: A Little Help from another person to put on and taking off regular upper body clothing?: A Little Help from another person to put on and taking off regular lower body clothing?: A Little 6 Click Score: 18   End of Session Equipment Utilized During Treatment: Rolling walker Nurse Communication: Mobility status;Precautions  Activity Tolerance: Patient limited by fatigue Patient left: in bed;with call bell/phone within reach;with chair alarm set  OT Visit Diagnosis: Unsteadiness on feet (R26.81);Muscle weakness (generalized) (M62.81)                Time: 1400-1431 OT Time Calculation (min): 31 min Charges:  OT General Charges $OT Visit: 1 Visit OT Evaluation $OT Eval Moderate Complexity: 1 Mod   Brynn, OTR/L  Acute Rehabilitation Services Pager: [phone] Office: [phone] .   Jeri Modena  08/29/2021, 2:47 PM

## 2021-08-29 NOTE — Progress Notes (Signed)
PT Cancellation Note  Patient Details Name: Anne [name] MRN: [id number] DOB: 08-11-1929   Cancelled Treatment:    Reason Eval/Treat Not Completed: Active bedrest order; can attempt later if activity orders updated.    Reginia Naas 08/29/2021, 9:12 AM Magda Kiel, PT Acute Rehabilitation Services VXUCJ:[phone] Office:[phone] 08/29/2021

## 2021-08-30 ENCOUNTER — Inpatient Hospital Stay (HOSPITAL_COMMUNITY): Payer: Medicare Other

## 2021-08-30 DIAGNOSIS — I6350 Cerebral infarction due to unspecified occlusion or stenosis of unspecified cerebral artery: Secondary | ICD-10-CM

## 2021-08-30 DIAGNOSIS — I639 Cerebral infarction, unspecified: Secondary | ICD-10-CM

## 2021-08-30 DIAGNOSIS — E1159 Type 2 diabetes mellitus with other circulatory complications: Secondary | ICD-10-CM

## 2021-08-30 LAB — GLUCOSE, CAPILLARY
Glucose-Capillary: 139 mg/dL — ABNORMAL HIGH (ref 70–99)
Glucose-Capillary: 105 mg/dL — ABNORMAL HIGH (ref 70–99)
Glucose-Capillary: 98 mg/dL (ref 70–99)
Glucose-Capillary: 105 mg/dL — ABNORMAL HIGH (ref 70–99)

## 2021-08-30 LAB — CBC
HCT: 40.5 % (ref 36.0–46.0)
Hemoglobin: 13.6 g/dL (ref 12.0–15.0)
MCH: 31.5 pg (ref 26.0–34.0)
MCHC: 33.6 g/dL (ref 30.0–36.0)
MCV: 93.8 fL (ref 80.0–100.0)
Platelets: 122 10*3/uL — ABNORMAL LOW (ref 150–400)
RBC: 4.32 MIL/uL (ref 3.87–5.11)
RDW: 12.2 % (ref 11.5–15.5)
WBC: 6.2 10*3/uL (ref 4.0–10.5)
nRBC: 0 % (ref 0.0–0.2)

## 2021-08-30 LAB — BASIC METABOLIC PANEL WITH GFR
Anion gap: 8 (ref 5–15)
BUN: 13 mg/dL (ref 8–23)
CO2: 21 mmol/L — ABNORMAL LOW (ref 22–32)
Calcium: 8.5 mg/dL — ABNORMAL LOW (ref 8.9–10.3)
Chloride: 109 mmol/L (ref 98–111)
Creatinine, Ser: 0.89 mg/dL (ref 0.44–1.00)
GFR, Estimated: >60 mL/min
Glucose, Bld: 117 mg/dL — ABNORMAL HIGH (ref 70–99)
Potassium: 3.6 mmol/L (ref 3.5–5.1)
Sodium: 138 mmol/L (ref 135–145)

## 2021-08-30 MED ORDER — HYDRALAZINE HCL 20 MG/ML IJ SOLN: 5.0000 mg | INTRAMUSCULAR | Status: DC | PRN

## 2021-08-30 MED ORDER — LEVOTHYROXINE SODIUM 75 MCG PO TABS
75.0000 ug | ORAL_TABLET | Freq: Every day | ORAL | Status: DC
  Administered 2021-08-31 – 2021-09-03 (×4): 75 ug via ORAL
  Filled 2021-08-30 (×4): qty 1

## 2021-08-30 MED ORDER — PANTOPRAZOLE SODIUM 40 MG PO TBEC
40.0000 mg | DELAYED_RELEASE_TABLET | Freq: Every day | ORAL | Status: DC
  Administered 2021-08-30 – 2021-09-03 (×5): 40 mg via ORAL
  Filled 2021-08-30 (×5): qty 1

## 2021-08-30 MED ORDER — ENOXAPARIN SODIUM 30 MG/0.3ML IJ SOSY
30.0000 mg | PREFILLED_SYRINGE | INTRAMUSCULAR | Status: DC
  Administered 2021-08-30 – 2021-09-03 (×5): 30 mg via SUBCUTANEOUS
  Filled 2021-08-30 (×5): qty 0.3

## 2021-08-30 MED ORDER — CLONAZEPAM 0.125 MG PO TBDP
0.1250 mg | ORAL_TABLET | Freq: Once | ORAL | Status: AC
  Administered 2021-08-30: 0.125 mg via ORAL
  Filled 2021-08-30: qty 1

## 2021-08-30 MED ORDER — CLOPIDOGREL BISULFATE 75 MG PO TABS
75.0000 mg | ORAL_TABLET | Freq: Every day | ORAL | Status: DC
  Administered 2021-08-30 – 2021-09-03 (×5): 75 mg via ORAL
  Filled 2021-08-30 (×5): qty 1

## 2021-08-30 MED ORDER — DIPHENHYDRAMINE HCL 50 MG/ML IJ SOLN: 12.5000 mg | Freq: Once | INTRAMUSCULAR | Status: DC

## 2021-08-30 MED ORDER — QUETIAPINE FUMARATE 25 MG PO TABS: 12.5000 mg | ORAL_TABLET | Freq: Once | ORAL | Status: DC

## 2021-08-30 MED ORDER — DIPHENHYDRAMINE HCL 50 MG/ML IJ SOLN: 12.5000 mg | Freq: Once | INTRAMUSCULAR | Status: AC

## 2021-08-30 MED ORDER — DIPHENHYDRAMINE HCL 50 MG/ML IJ SOLN
INTRAMUSCULAR | Status: AC
  Administered 2021-08-30: 12.5 mg
  Filled 2021-08-30: qty 1

## 2021-08-30 MED ORDER — MELATONIN 3 MG PO TABS: 3.0000 mg | ORAL_TABLET | Freq: Every day | ORAL | Status: DC

## 2021-08-30 NOTE — Progress Notes (Signed)
Pt hallucinating that she is pregnant and wanting to go home, not following commands call husband to talk with her to see if she could calm down but not listening to him, MD paged, awaiting feedback

## 2021-08-30 NOTE — Progress Notes (Signed)
Physical Therapy Treatment Patient Details Name: Anne [name] MRN: [id number] DOB: 20-Jul-1929 Today's Date: 08/30/2021   History of Present Illness 87 yo female admitted with CT no evidence of infarct or hemorrhage admitted with slurred speech hypertension and hyperglycemia.  She had CT negative for bleed so now s/p TNK. MRI Small acute right frontal infarct. PMH GERD colon CA    PT Comments    Patient progressing towards physical therapy goals. Patient disoriented to place and situation this date. Patient demos mild L inattention during mobility with pushing RW with R hand and L hand off RW. Cues required for attending to L side and finding objects/places on L side. Requires minA for ambulation with RW for balance and safety. Continue to recommend SNF for ongoing Physical Therapy.       Recommendations for follow up therapy are one component of a multi-disciplinary discharge planning process, led by the attending physician.  Recommendations may be updated based on patient status, additional functional criteria and insurance authorization.  Follow Up Recommendations  SNF     Equipment Recommendations  None recommended by PT    Recommendations for Other Services       Precautions / Restrictions Precautions Precautions: Fall Restrictions Weight Bearing Restrictions: No     Mobility  Bed Mobility Overal bed mobility: Needs Assistance Bed Mobility: Supine to Sit;Sit to Supine     Supine to sit: Min assist Sit to supine: Min assist   General bed mobility comments: minA for trunk elevation and returning LEs to bed.    Transfers Overall transfer level: Needs assistance Equipment used: Rolling Ithiel Liebler (2 wheeled) Transfers: Sit to/from Stand Sit to Stand: Min assist         General transfer comment: minA to power up into standing. Cues to place hands on RW each stand during session  Ambulation/Gait Ambulation/Gait assistance: Min assist Gait Distance (Feet): 20  Feet Assistive device: Rolling Kyoko Elsea (2 wheeled) Gait Pattern/deviations: Step-to pattern;Decreased stride length;Shuffle;Decreased dorsiflexion - left Gait velocity: decreased   General Gait Details: minA for balance. Patient tends to leave RW to side and requires cues to maintain hands on RW. Seems to have mild L inattention during mobility   Stairs             Wheelchair Mobility    Modified Rankin (Stroke Patients Only) Modified Rankin (Stroke Patients Only) Pre-Morbid Rankin Score: Slight disability Modified Rankin: Moderately severe disability     Balance Overall balance assessment: Needs assistance   Sitting balance-Leahy Scale: Fair Sitting balance - Comments: at EOB and on toilet, able to perform toilet hyiene with S   Standing balance support: Bilateral upper extremity supported Standing balance-Leahy Scale: Poor Standing balance comment: reliant on UE support and external assist                            Cognition Arousal/Alertness: Awake/alert Behavior During Therapy: WFL for tasks assessed/performed Overall Cognitive Status: No family/caregiver present to determine baseline cognitive functioning                                 General Comments: impulsive at times but cooperative. Disoriented to place and situation      Exercises General Exercises - Lower Extremity Ankle Circles/Pumps: AROM;Both;10 reps;Seated Long Arc Quad: AROM;Both;10 reps;Seated Hip Flexion/Marching: AROM;Both;10 reps;Seated    General Comments        Pertinent Vitals/Pain  Pain Assessment: No/denies pain    Home Living                      Prior Function            PT Goals (current goals can now be found in the care plan section) Acute Rehab PT Goals Patient Stated Goal: to get out of bed PT Goal Formulation: Patient unable to participate in goal setting Time For Goal Achievement: 09/12/21 Potential to Achieve Goals:  Fair Progress towards PT goals: Progressing toward goals    Frequency    Min 3X/week      PT Plan Current plan remains appropriate    Co-evaluation              AM-PAC PT "6 Clicks" Mobility   Outcome Measure  Help needed turning from your back to your side while in a flat bed without using bedrails?: A Little Help needed moving from lying on your back to sitting on the side of a flat bed without using bedrails?: A Little Help needed moving to and from a bed to a chair (including a wheelchair)?: A Little Help needed standing up from a chair using your arms (e.g., wheelchair or bedside chair)?: A Little Help needed to walk in hospital room?: A Little Help needed climbing 3-5 steps with a railing? : Total 6 Click Score: 16    End of Session Equipment Utilized During Treatment: Gait belt Activity Tolerance: Patient tolerated treatment well Patient left: in bed;with call bell/phone within reach;with bed alarm set Nurse Communication: Mobility status PT Visit Diagnosis: Other abnormalities of gait and mobility (R26.89);Muscle weakness (generalized) (M62.81);Hemiplegia and hemiparesis;Other symptoms and signs involving the nervous system (R29.898) Hemiplegia - Right/Left: Left Hemiplegia - dominant/non-dominant: Non-dominant Hemiplegia - caused by: Cerebral infarction     Time: 1551-1616 PT Time Calculation (min) (ACUTE ONLY): 25 min  Charges:  $Gait Training: 8-22 mins $Therapeutic Exercise: 8-22 mins                     Trenyce Loera A. Gilford Rile PT, DPT Acute Rehabilitation Services Pager [phone] Office [phone]    Linna Hoff 08/30/2021, 5:27 PM

## 2021-08-30 NOTE — Progress Notes (Addendum)
STROKE TEAM PROGRESS NOTE   INTERVAL HISTORY No acute events No visitors at bedside.  VSS with asymptomatic bradycardia (HR 40s-70s), SR on tele, Tmax 98.6, BP 110s- 170s/48-85. On RA with oxygen saturations high 90s.   Today she is mildly drowsy. She becomes more alert during our rounds. She is conversant with dysarthric but mostly comprehensible speech. She is following commands. She I less oriented today, she believes she is at home but she had a stroke and is able to state month is September. Small acute right frontal infarct seen on MRI yesterday. Dr. Erlinda Hong discussed patient's ongoing work up and plan of care with her. She had no questions.   Vitals:   08/30/21 0700 08/30/21 0800 08/30/21 0900 08/30/21 1000  BP: (!) 143/55 (!) 149/71 (!) 158/77 (!) 173/83  Pulse: (!) 52 (!) 58 63 61  Resp: 20 13 16 19   Temp:  98.6 F (37 C)    TempSrc:  Oral    SpO2: 95% 96% 96% 99%  Weight:      Height:       CBC:  Recent Labs  Lab 08/28/21 1720 08/28/21 1728 08/29/21 0218 08/30/21 0356  WBC 5.6  --  6.4 6.2  NEUTROABS 3.4  --   --   --   HGB 14.4   < > 14.1 13.6  HCT 43.9   < > 42.0 40.5  MCV 95.9  --  93.8 93.8  PLT 131*  --  133* 122*   < > = values in this interval not displayed.   Basic Metabolic Panel:  Recent Labs  Lab 08/29/21 0218 08/30/21 0356  NA 142 138  K 3.6 3.6  CL 109 109  CO2 25 21*  GLUCOSE 120* 117*  BUN 14 13  CREATININE 0.84 0.89  CALCIUM 8.7* 8.5*   Lipid Panel:  Recent Labs  Lab 08/29/21 0218  CHOL 168  TRIG 57  HDL 58  CHOLHDL 2.9  VLDL 11  LDLCALC 99   HgbA1c:  Recent Labs  Lab 08/29/21 0218  HGBA1C 5.9*   Urine Drug Screen:  Recent Labs  Lab 08/28/21 2034  LABOPIA NONE DETECTED  COCAINSCRNUR NONE DETECTED  LABBENZ NONE DETECTED  AMPHETMU NONE DETECTED  THCU NONE DETECTED  LABBARB NONE DETECTED    Alcohol Level  Recent Labs  Lab 08/28/21 1941  ETH <10    IMAGING past 24 hours MR BRAIN WO CONTRAST  Result Date:  08/29/2021 CLINICAL DATA:  Stroke follow-up.  Post TNK. EXAM: MRI HEAD WITHOUT CONTRAST TECHNIQUE: Multiplanar, multiecho pulse sequences of the brain and surrounding structures were obtained without intravenous contrast. COMPARISON:  Head CT and CTA 08/28/2021 FINDINGS: Brain: There is a small acute right MCA territory infarct involving cortex and subcortical white matter in the posterior right frontal lobe. No intracranial hemorrhage, mass, midline shift, or extra-axial fluid collection is identified. T2 hyperintensities in the cerebral white matter bilaterally and in the pons are nonspecific but compatible with mild chronic small vessel ischemic disease. There is a small chronic right cerebellar infarct. There is moderate cerebral atrophy. Vascular: Major intracranial vascular flow voids are preserved. Skull and upper cervical spine: Unremarkable. Sinuses/Orbits: Unremarkable orbits. Paranasal sinuses and mastoid air cells are clear. Other: None. IMPRESSION: 1. Small acute right frontal infarct. 2. Mild chronic small vessel ischemic disease and moderate cerebral atrophy. 3. Chronic right cerebellar infarct. Electronically Signed   By: Logan Bores M.D.   On: 08/29/2021 17:27   ECHOCARDIOGRAM COMPLETE  Result Date: 08/29/2021  ECHOCARDIOGRAM REPORT   Patient Name:   Anne [name] Date of Exam: 08/29/2021 Medical Rec #:  [id number]          Height:       64.0 in Accession #:    [id number]         Weight:       138.7 lb Date of Birth:  September 24, 1929          BSA:          1.674 m Patient Age:    87 years           BP:           170/68 mmHg Patient Gender: F                  HR:           51 bpm. Exam Location:  Inpatient Procedure: 2D Echo, 3D Echo, Cardiac Doppler and Color Doppler Indications:    Stroke I63.9  History:        Patient has no prior history of Echocardiogram examinations.                 Chronic kidney disease. GERD. Thyroid disease.  Sonographer:    Darlina Sicilian RDCS Referring Phys: [id number]  [place]  1. Left ventricular ejection fraction, by estimation, is 60 to 65%. Left ventricular ejection fraction by 3D volume is 62 %. The left ventricle has normal function. The left ventricle has no regional wall motion abnormalities. Left ventricular diastolic  parameters are consistent with Grade I diastolic dysfunction (impaired relaxation).  2. Right ventricular systolic function is normal. The right ventricular size is normal. There is normal pulmonary artery systolic pressure. The estimated right ventricular systolic pressure is 26.3 mmHg.  3. Abnormal LA resevoir strain at 11.1%.  4. The mitral valve is abnormal. Trivial mitral valve regurgitation.  5. The aortic valve is tricuspid. Aortic valve regurgitation is mild to moderate.  6. The inferior vena cava is normal in size with greater than 50% respiratory variability, suggesting right atrial pressure of 3 mmHg. Comparison(s): No prior Echocardiogram. FINDINGS  Left Ventricle: Left ventricular ejection fraction, by estimation, is 60 to 65%. Left ventricular ejection fraction by 3D volume is 62 %. The left ventricle has normal function. The left ventricle has no regional wall motion abnormalities. The left ventricular internal cavity size was normal in size. There is no left ventricular hypertrophy. Left ventricular diastolic parameters are consistent with Grade I diastolic dysfunction (impaired relaxation). Indeterminate filling pressures. Right Ventricle: The right ventricular size is normal. No increase in right ventricular wall thickness. Right ventricular systolic function is normal. There is normal pulmonary artery systolic pressure. The tricuspid regurgitant velocity is 2.35 m/s, and  with an assumed right atrial pressure of 3 mmHg, the estimated right ventricular systolic pressure is 78.5 mmHg. Left Atrium: Abnormal LA resevoir strain at 11.1%. Left atrial size was normal in size. Right Atrium: Right atrial size was normal in  size. Pericardium: There is no evidence of pericardial effusion. Mitral Valve: The mitral valve is abnormal. There is mild thickening of the mitral valve leaflet(s). Trivial mitral valve regurgitation. MV peak gradient, 0.3 mmHg. The mean mitral valve gradient is 0.0 mmHg. Tricuspid Valve: The tricuspid valve is grossly normal. Tricuspid valve regurgitation is trivial. Aortic Valve: The aortic valve is tricuspid. Aortic valve regurgitation is mild to moderate. Aortic regurgitation PHT measures 872 msec. Pulmonic Valve: The pulmonic valve was grossly normal. Pulmonic  valve regurgitation is trivial. Aorta: The aortic root and ascending aorta are structurally normal, with no evidence of dilitation. Venous: The inferior vena cava is normal in size with greater than 50% respiratory variability, suggesting right atrial pressure of 3 mmHg. IAS/Shunts: No atrial level shunt detected by color flow Doppler.  LEFT VENTRICLE PLAX 2D LVIDd:         4.80 cm         Diastology LVIDs:         3.40 cm         LV e' medial:    3.60 cm/s LV PW:         0.70 cm         LV E/e' medial:  11.1 LV IVS:        1.00 cm         LV e' lateral:   3.30 cm/s LVOT diam:     2.00 cm         LV E/e' lateral: 12.2 LV SV:         57 LV SV Index:   34 LVOT Area:     3.14 cm        3D Volume EF                                LV 3D EF:    Left                                             ventricul                                             ar                                             ejection                                             fraction                                             by 3D                                             volume is                                             62 %.                                 3D Volume EF:  3D EF:        62 %                                LV EDV:       121 ml                                LV ESV:       46 ml                                LV SV:        75 ml RIGHT  VENTRICLE RV S prime:     13.20 cm/s TAPSE (M-mode): 1.6 cm LEFT ATRIUM             Index       RIGHT ATRIUM           Index LA diam:        3.80 cm 2.27 cm/m  RA Area:     10.10 cm LA Vol (A2C):   32.9 ml 19.65 ml/m RA Volume:   14.70 ml  8.78 ml/m LA Vol (A4C):   45.7 ml 27.29 ml/m LA Biplane Vol: 39.1 ml 23.35 ml/m  AORTIC VALVE LVOT Vmax:   73.90 cm/s LVOT Vmean:  46.300 cm/s LVOT VTI:    0.182 m AI PHT:      872 msec  AORTA Ao Root diam: 3.10 cm Ao Asc diam:  3.60 cm MITRAL VALVE               TRICUSPID VALVE MV Area (PHT): 2.22 cm    TR Peak grad:   22.1 mmHg MV Area VTI:   6.95 cm    TR Vmax:        235.00 cm/s MV Peak grad:  0.3 mmHg MV Mean grad:  0.0 mmHg    SHUNTS MV Vmax:       0.26 m/s    Systemic VTI:  0.18 m MV Vmean:      15.5 cm/s   Systemic Diam: 2.00 cm MV Decel Time: 342 msec MV E velocity: 40.10 cm/s MV A velocity: 40.40 cm/s MV E/A ratio:  0.99 Lyman Bishop MD Electronically signed by Lyman Bishop MD Signature Date/Time: 08/29/2021/2:55:14 PM    Final     PHYSICAL EXAM  Pleasant elderly Caucasian lady not in distress. . Afebrile. Head is nontraumatic. Neck is supple without bruit.    Cardiac exam no murmur or gallop. Lungs are clear to auscultation. Distal pulses are well felt.  Neurological Exam :   Temp:  [97.6 F (36.4 C)-98.6 F (37 C)] 98.3 F (36.8 C) (10/01 1200) Pulse Rate:  [47-70] 55 (10/01 1200) Resp:  [10-22] 17 (10/01 1200) BP: (114-173)/(48-85) 142/83 (10/01 1200) SpO2:  [94 %-99 %] 97 % (10/01 1200)  General - Well nourished, well developed, in no apparent distress.  Ophthalmologic - fundi not visualized due to noncooperation.  Cardiovascular - Regular rhythm and rate.  Mental Status -  Mildly somnolent, arouses easily to verbal stimuli. Disoriented to place. Oriented to self, situation, month.  Language is dysarthric, mostly comprehensible with naming, repetition, comprehension intact. Attention span and concentration were impaired. Recent and  remote memory was impaired.  Cranial Nerves II - XII - II - Visual field intact OU. III,  IV, VI - Extraocular movements intact. V - Facial sensation intact bilaterally. VII - Left facial weakness  VIII - Hearing & vestibular intact bilaterally. X - Palate elevates symmetrically. XI - Chin turning & shoulder shrug intact bilaterally. XII - Tongue protrusion intact.  Motor Strength - The patient's strength was normal in all extremities and pronator drift was absent.  Bulk was normal and fasciculations were absent.   Motor Tone - Muscle tone was assessed at the neck and appendages and was normal.  Reflexes - The patient's reflexes were symmetrical in all extremities and she had no pathological reflexes.  Sensory - Light touch, temperature/pinprick were assessed and were symmetrical.    Coordination - The patient had normal movements in the hands and feet with no ataxia or dysmetria. Tremor was absent.  Gait and Station - deferred.     ASSESSMENT/PLAN Anne [name] is a 87 y.o. female with a medical history significant for type 2 diabetes mellitus with diabetic chronic kidney disease stage III, depression, GERD, and colon cancer s/p hemicolectomy in 2015 in remission who presented to the ED today via EMS for evaluation of left facial droop and slurred speech.  Patient had family visiting with her today and she was at her baseline status when they left just after 15:00. At 15:30 patient's husband noted that Ms. Rendleman had slurred speech and left-sided facial droop. He called his family that had just left from visiting to come and check on her and EMS was subsequently activated. CBG for EMS was 272 and patient was hypertensive with a systolic blood pressure in the 190's. Patient was able to communicate with providers that she has been feeling dizzy lately and has been under an increased amount of stress. BP was treated. CTH without evidence of hemorrhage, risks and benefits of treatment  were discussed with patient and family and TNK was administered at 1739. No LVO was identified. She was admitted to the neurologic ICU for pos thrombolytic care and further work up.    Stroke - right frontal cortical infarct s/p TNK, embolic pattern, etiology unclear, concerning for occult afib Code Stroke CT head  No acute abnormality. CTA head & neck  1. No large vessel occlusion. 2. Moderate left paraclinoid ICA stenosis. 3. Mild-to-moderate narrowing of the dominant right intradural vertebral artery and mild narrowing of the distal basilar artery. 4. Streak artifact from refluxed venous contrast in the neck obscures the proximal left vertebral artery. 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 The small left vertebral artery in the upper neck is opacified, but irregularly narrowed intradurally, likely terminating as PICA. MRI  1. Small acute right frontal infarct. 2. Mild chronic small vessel ischemic disease and moderate cerebral atrophy. 3. Chronic right cerebellar infarct.   2D Echo  EF 40-98%, Grade I diastolic dysfunction, No thrombus, wall motion abnormality or shunt found.   LDL 99 HgbA1c 5.9 VTE prophylaxis - lovenox No anticoagulant or antiplatelet prior to admission ASA 81mg , Plavix 75 mg DAPT x 3 weeks, then ASA alone Consider LOOP placement, per Cards APP message sent to cards master   Obtain stat HCT for neurologic decline  Delirium precautions  Therapy recommendations: SNF Disposition: SNF  Hypertension Home meds:  None  Stable BP goal less than 180/105  Long-term BP goal normotensive  Hyperlipidemia Home meds: Zocor 20mg   LDL 99, goal < 70 High intensity statin  increase home dose of Zocor to 40 mg Continue statin at discharge        Diabetes type II Controlled Home meds:  None  HgbA1c 5.9, goal <  7.0 CBGs SSI protocol        Asymptomatic bradycardia HR 40s -70s Continue cardiac monitoring Consider Loop recorder for further monitoring       CKD Cre 1.16->0.84->0.89 Monitor  Daily labs   Other Stroke Risk Factors Advanced Age >/= 74  Family hx stroke (Mother)  Other Active Problems Hypothyroidism, continue home dose of synthroid   Hospital day # 2  This patient was seen and evaluated with Dr. Erlinda Hong. He directed the plan of care.  Charlene Brooke, NP-C   ATTENDING NOTE: I reviewed above note and agree with the assessment and plan. Pt was seen and examined.   87 year old female with history of diabetes, CKD 3, colon cancer status postsurgery in remission admitted for left facial droop and severe dysarthria.  CT no acute abnormalities status post TNK, CTA head and neck moderate left cavernous ICA stenosis.  MRI showed right cortical frontal linear shaped infarct.  EF 60 to 65%, A1c 5.9, LDL 99.  Creatinine 0.84.  LE venous Doppler pending  On exam, patient awake alert, lying in bed, orientated to age and self, however not orientated to place, time.  Moderate to severe dysarthria, however no aphasia, fluent language, able to name and repeat.  Follow most simple commands.  No gaze palsy, visual field fall.  Left facial droop.  Bilateral upper and lower extremity equal strengths.  Sensation symmetrical subjectively, finger-to-nose grossly intact bilaterally.  Etiology for patient's stroke likely embolic, concerning for occult A. fib.  Continue aspirin 81 and Plavix 75 DAPT for 3 weeks and then aspirin alone.  Increase Zocor 20 to 40.  Will consider loop recorder placement if embolic work-up negative.  PT/OT recommend SNF.  For detailed assessment and plan, please refer to above as I have made changes wherever appropriate.   Rosalin Hawking, MD PhD Stroke Neurology 08/30/2021 1:50 PM  This patient is critically ill due to acute stroke status post TNK, bradycardia and at  significant risk of neurological worsening, death form recurrent stroke, hemorrhagic conversion, cardiac arrest, heart failure. This patient's care requires constant monitoring of vital signs, hemodynamics, respiratory and cardiac monitoring, review of multiple databases, neurological assessment, discussion with family, other specialists and medical decision making of high complexity. I spent 40 minutes of neurocritical care time in the care of this patient. I had long discussion with husband over the phone, updated pt current condition, treatment plan and potential prognosis, and answered all the questions.  He expressed understanding and appreciation.      To contact Stroke Continuity provider, please refer to [email or web address] After hours, contact General Neurology

## 2021-08-30 NOTE — Plan of Care (Signed)
Spoke to Anne [name] by phone. Explained status, stroke diagnosis and plan of care. His questions were answered.

## 2021-08-30 NOTE — Progress Notes (Signed)
Anne [name]  Code Status: FULL TYKESHIA TOURANGEAU is a 87 y.o. female patient admitted from 4N awake, alert - oriented X2 - no acute distress noted. VSS -  no c/o shortness of breath, no c/o chest pain. Cardiac tele in place.  Skin, clean-dry- intact. Small bruise noted to right hip. No evidence of skin break down noted on exam. Husband at bedside. ?  Will cont to eval and treat per MD orders.  Melonie Florida, RN  08/30/2021

## 2021-08-30 NOTE — Progress Notes (Signed)
VASCULAR LAB    Bilateral lower extremity venous duplex has been performed.  See CV proc for preliminary results.   Henryk Ursin, RVT 08/30/2021, 5:03 PM

## 2021-08-31 DIAGNOSIS — Z9282 Status post administration of tPA (rtPA) in a different facility within the last 24 hours prior to admission to current facility: Secondary | ICD-10-CM

## 2021-08-31 LAB — CBC
HCT: 46.3 % — ABNORMAL HIGH (ref 36.0–46.0)
Hemoglobin: 15 g/dL (ref 12.0–15.0)
MCH: 30.7 pg (ref 26.0–34.0)
MCHC: 32.4 g/dL (ref 30.0–36.0)
MCV: 94.7 fL (ref 80.0–100.0)
Platelets: 148 10*3/uL — ABNORMAL LOW (ref 150–400)
RBC: 4.89 MIL/uL (ref 3.87–5.11)
RDW: 12.4 % (ref 11.5–15.5)
WBC: 6.7 10*3/uL (ref 4.0–10.5)
nRBC: 0 % (ref 0.0–0.2)

## 2021-08-31 LAB — BASIC METABOLIC PANEL
Anion gap: 9 (ref 5–15)
BUN: 15 mg/dL (ref 8–23)
CO2: 23 mmol/L (ref 22–32)
Calcium: 8.9 mg/dL (ref 8.9–10.3)
Chloride: 110 mmol/L (ref 98–111)
Creatinine, Ser: 0.92 mg/dL (ref 0.44–1.00)
GFR, Estimated: 59 mL/min — ABNORMAL LOW (ref >60)
Glucose, Bld: 112 mg/dL — ABNORMAL HIGH (ref 70–99)
Potassium: 3.9 mmol/L (ref 3.5–5.1)
Sodium: 142 mmol/L (ref 135–145)

## 2021-08-31 LAB — GLUCOSE, CAPILLARY
Glucose-Capillary: 122 mg/dL — ABNORMAL HIGH (ref 70–99)
Glucose-Capillary: 130 mg/dL — ABNORMAL HIGH (ref 70–99)
Glucose-Capillary: 86 mg/dL (ref 70–99)
Glucose-Capillary: 161 mg/dL — ABNORMAL HIGH (ref 70–99)

## 2021-08-31 MED ORDER — ALPRAZOLAM 0.25 MG PO TABS
0.1250 mg | ORAL_TABLET | Freq: Two times a day (BID) | ORAL | Status: DC
  Administered 2021-08-31 – 2021-09-03 (×6): 0.125 mg via ORAL
  Filled 2021-08-31 (×6): qty 1

## 2021-08-31 MED ORDER — ALPRAZOLAM 0.25 MG PO TABS
0.2500 mg | ORAL_TABLET | Freq: Every day | ORAL | Status: DC
  Administered 2021-08-31 – 2021-09-02 (×3): 0.25 mg via ORAL
  Filled 2021-08-31 (×3): qty 1

## 2021-08-31 MED ORDER — AMLODIPINE BESYLATE 5 MG PO TABS
5.0000 mg | ORAL_TABLET | Freq: Every day | ORAL | Status: DC
  Administered 2021-08-31 – 2021-09-02 (×3): 5 mg via ORAL
  Filled 2021-08-31 (×4): qty 1

## 2021-08-31 MED ORDER — ATORVASTATIN CALCIUM 10 MG PO TABS
20.0000 mg | ORAL_TABLET | Freq: Every day | ORAL | Status: DC
  Administered 2021-08-31 – 2021-09-02 (×3): 20 mg via ORAL
  Filled 2021-08-31 (×3): qty 2

## 2021-08-31 NOTE — [place] FL2 (Signed)
[place] LEVEL OF CARE SCREENING TOOL     IDENTIFICATION  Patient Name: Anne [name] Birthdate: 06-05-1929 Sex: female Admission Date (Current Location): 08/28/2021  [facility] and [place] Number:  Herbalist and Address:  [facility] [place]. [facility] [place], [place] [address] [place], [place] [address]      Provider Number: [id number]  Attending Physician Name and Address:  Stroke, Md, MD  Relative Name and Phone Number:  Kailany Dinunzio, [phone]    Current Level of Care: Hospital Recommended Level of Care: [place] Prior Approval Number:    Date Approved/Denied:   PASRR Number:    Discharge Plan: SNF    Current Diagnoses: Patient Active Problem List   Diagnosis Date Noted   Ischemic stroke ([place]) 08/28/2021   Chest pain 05/18/2017   UTI (urinary tract infection) 07/24/2015   GERD (gastroesophageal reflux disease) 07/24/2015   Cancer of ascending colon ([place]) 12/19/2013   Colon cancer ([place]) 12/14/2013   Cholelithiasis 10/05/2011   Dysuria 09/18/2011    Orientation RESPIRATION BLADDER Height & Weight     Self  Normal Continent, External catheter Weight: 138 lb 10.7 oz (62.9 kg) Height:  5\' 4"  (162.6 cm)  BEHAVIORAL SYMPTOMS/MOOD NEUROLOGICAL BOWEL NUTRITION STATUS      Continent Diet (See DC summary)  AMBULATORY STATUS COMMUNICATION OF NEEDS Skin   Extensive Assist Verbally Skin abrasions (Ecchymosis L hip)                       Personal Care Assistance Level of Assistance  Bathing, Feeding, Dressing Bathing Assistance: Maximum assistance   Dressing Assistance: Limited assistance     Functional Limitations Info  Sight, Hearing, Speech Sight Info: Impaired Hearing Info: Adequate Speech Info: Impaired    SPECIAL CARE FACTORS FREQUENCY  PT (By licensed PT), OT (By licensed OT)     PT Frequency: 5x week OT Frequency: 5x week            Contractures Contractures Info: Not present     Additional Factors Info  Code Status, Allergies, Insulin Sliding Scale, Psychotropic Code Status Info: Full Allergies Info: NKA Psychotropic Info: Xanax Insulin Sliding Scale Info: Insulin Aspart (Novolog) 0-9 U 3x daily w/ meals       Current Medications (08/31/2021):  This is the current hospital active medication list Current Facility-Administered Medications  Medication Dose Route Frequency Provider Last Rate Last Admin    stroke: mapping our early stages of recovery book   Does not apply Once Bailey-Modzik, Delila A, NP       acetaminophen (TYLENOL) tablet 650 mg  650 mg Oral Q4H PRN Bailey-Modzik, Delila A, NP       Or   acetaminophen (TYLENOL) 160 MG/5ML solution 650 mg  650 mg Per Tube Q4H PRN Bailey-Modzik, Delila A, NP       Or   acetaminophen (TYLENOL) suppository 650 mg  650 mg Rectal Q4H PRN Bailey-Modzik, Delila A, NP       ALPRAZolam (XANAX) tablet 0.125 mg  0.125 mg Oral BID Bailey-Modzik, Delila A, NP   0.125 mg at 08/31/21 1440   ALPRAZolam (XANAX) tablet 0.25 mg  0.25 mg Oral QHS Bailey-Modzik, Delila A, NP       amLODipine (NORVASC) tablet 5 mg  5 mg Oral Daily Bailey-Modzik, Delila A, NP   5 mg at 08/31/21 1102   aspirin EC tablet 81 mg  81 mg Oral Daily Bailey-Modzik, Delila A, NP   81  mg at 08/31/21 0853   chlorhexidine (PERIDEX) 0.12 % solution 15 mL  15 mL Mouth Rinse BID Bailey-Modzik, Delila A, NP   15 mL at 08/31/21 0853   clopidogrel (PLAVIX) tablet 75 mg  75 mg Oral Daily Bailey-Modzik, Delila A, NP   75 mg at 08/31/21 0853   enoxaparin (LOVENOX) injection 30 mg  30 mg Subcutaneous Q24H Rosalin Hawking, MD   30 mg at 08/31/21 1220   hydrALAZINE (APRESOLINE) injection 5-10 mg  5-10 mg Intravenous Q4H PRN Rosalin Hawking, MD       insulin aspart (novoLOG) injection 0-9 Units  0-9 Units Subcutaneous TID WC Bailey-Modzik, Delila A, NP   1 Units at 08/31/21 1219   levothyroxine (SYNTHROID) tablet 75 mcg  75 mcg Oral Q0600 Rosalin Hawking, MD   75 mcg at 08/31/21 0527    MEDLINE mouth rinse  15 mL Mouth Rinse q12n4p Bailey-Modzik, Delila A, NP   15 mL at 08/31/21 1228   ondansetron (ZOFRAN) injection 4 mg  4 mg Intravenous Q6H PRN Bailey-Modzik, Delila A, NP   4 mg at 08/29/21 1126   pantoprazole (PROTONIX) EC tablet 40 mg  40 mg Oral Daily Bailey-Modzik, Delila A, NP   40 mg at 08/31/21 0853   senna-docusate (Senokot-S) tablet 1 tablet  1 tablet Oral QHS PRN Bailey-Modzik, Delila A, NP       simvastatin (ZOCOR) tablet 40 mg  40 mg Oral q1800 Bailey-Modzik, Delila A, NP   40 mg at 08/30/21 1759   sodium chloride flush (NS) 0.9 % injection 3 mL  3 mL Intravenous Once Bailey-Modzik, Delila A, NP         Discharge Medications: Please see discharge summary for a list of discharge medications.  Relevant Imaging Results:  Relevant Lab Results:   Additional Information SS# [place], [place]

## 2021-08-31 NOTE — Progress Notes (Addendum)
STROKE TEAM PROGRESS NOTE   INTERVAL HISTORY No acute events Husband and son in law at bedside.  VSS with asymptomatic bradycardia (HR 52-65), SR on tele, Tmax 99.7. BP 126s- 170s/59-84. On RA with oxygen saturations high 90s.  Today she is more alert and oriented and participating more in conversations. She remains dysarthric. She is wanting to go home and is reluctant to consider a skilled nursing facility stay. She reports taking 1/2 Xanax 0.25 in the morning and afternoon and on whole tablet at bedtime for anxiety. We had an extensive discussion with her, her son in law and husband at bedside reviewing her diagnosis, work up and plan of care including discharge planning. We explained her safety and best outcome is our goal and is guiding our recommendations. Explained current 24 hour care needs. Plan for husband to come in for therapy sessions tomorrow to witness patient's progress. Son-in-law encouraged patient to consider SNF stay. Messages sent to social worker and [place] PT regarding our conversation.   Vitals:   08/30/21 2016 08/30/21 2326 08/31/21 0412 08/31/21 0733  BP: (!) 166/75 (!) 157/69 (!) 146/59 (!) 164/84  Pulse: (!) 55 (!) 57 (!) 54 65  Resp: 16 15 14 20   Temp: 97.8 F (36.6 C) 98.8 F (37.1 C) 99.7 F (37.6 C) 98.9 F (37.2 C)  TempSrc: Oral Axillary Oral Oral  SpO2: 100% 100% 98% 99%  Weight:      Height:       CBC:  Recent Labs  Lab 08/28/21 1720 08/28/21 1728 08/30/21 0356 08/31/21 0255  WBC 5.6   < > 6.2 6.7  NEUTROABS 3.4  --   --   --   HGB 14.4   < > 13.6 15.0  HCT 43.9   < > 40.5 46.3*  MCV 95.9   < > 93.8 94.7  PLT 131*   < > 122* 148*   < > = values in this interval not displayed.   Basic Metabolic Panel:  Recent Labs  Lab 08/30/21 0356 08/31/21 0255  NA 138 142  K 3.6 3.9  CL 109 110  CO2 21* 23  GLUCOSE 117* 112*  BUN 13 15  CREATININE 0.89 0.92  CALCIUM 8.5* 8.9   Lipid Panel:  Recent Labs  Lab 08/29/21 0218  CHOL 168  TRIG  57  HDL 58  CHOLHDL 2.9  VLDL 11  LDLCALC 99   HgbA1c:  Recent Labs  Lab 08/29/21 0218  HGBA1C 5.9*   Urine Drug Screen:  Recent Labs  Lab 08/28/21 2034  LABOPIA NONE DETECTED  COCAINSCRNUR NONE DETECTED  LABBENZ NONE DETECTED  AMPHETMU NONE DETECTED  THCU NONE DETECTED  LABBARB NONE DETECTED    Alcohol Level  Recent Labs  Lab 08/28/21 1941  ETH <10    IMAGING past 24 hours VAS [place] LOWER EXTREMITY VENOUS (DVT)  Result Date: 08/30/2021  Lower Venous DVT Study Patient Name:  CHRYSTIE HAGWOOD  Date of Exam:   08/30/2021 Medical Rec #: [id number]           Accession #:    [id number] Date of Birth: 1929/02/21           Patient Gender: F Patient Age:   87 years Exam [facility] Procedure:      VAS [place] LOWER EXTREMITY VENOUS (DVT) Referring Phys: Cornelius Moras Layaan Mott --------------------------------------------------------------------------------  Indications: Stroke.  Limitations: Patient's inability to fully follow directions. Comparison Study: No prior study on file Performing Technologist: Sharion Dove RVS  Examination Guidelines: A complete evaluation includes B-mode imaging, spectral Doppler, color Doppler, and power Doppler as needed of all accessible portions of each vessel. Bilateral testing is considered an integral part of a complete examination. Limited examinations for reoccurring indications may be performed as noted. The reflux portion of the exam is performed with the patient in reverse Trendelenburg.  +---------+---------------+---------+-----------+----------+--------------+ RIGHT    CompressibilityPhasicitySpontaneityPropertiesThrombus Aging +---------+---------------+---------+-----------+----------+--------------+ CFV      Full           Yes      Yes                                 +---------+---------------+---------+-----------+----------+--------------+ SFJ      Full                                                         +---------+---------------+---------+-----------+----------+--------------+ FV Prox  Full                                                        +---------+---------------+---------+-----------+----------+--------------+ FV Mid   Full                                                        +---------+---------------+---------+-----------+----------+--------------+ FV DistalFull                                                        +---------+---------------+---------+-----------+----------+--------------+ PFV      Full                                                        +---------+---------------+---------+-----------+----------+--------------+ POP      Full           Yes      Yes                                 +---------+---------------+---------+-----------+----------+--------------+ PTV      Full                                                        +---------+---------------+---------+-----------+----------+--------------+ PERO     Full                                                        +---------+---------------+---------+-----------+----------+--------------+   +---------+---------------+---------+-----------+----------+--------------+  LEFT     CompressibilityPhasicitySpontaneityPropertiesThrombus Aging +---------+---------------+---------+-----------+----------+--------------+ CFV      Full           Yes      Yes                                 +---------+---------------+---------+-----------+----------+--------------+ SFJ      Full                                                        +---------+---------------+---------+-----------+----------+--------------+ FV Prox  Full                                                        +---------+---------------+---------+-----------+----------+--------------+ FV Mid   Full                                                         +---------+---------------+---------+-----------+----------+--------------+ FV DistalFull                                                        +---------+---------------+---------+-----------+----------+--------------+ PFV      Full                                                        +---------+---------------+---------+-----------+----------+--------------+ POP      Full           Yes      Yes                                 +---------+---------------+---------+-----------+----------+--------------+ PTV      Full                                                        +---------+---------------+---------+-----------+----------+--------------+ PERO     Full                                                        +---------+---------------+---------+-----------+----------+--------------+    Summary: BILATERAL: - No evidence of deep vein thrombosis seen in the lower extremities, bilaterally. -   *See table(s) above for measurements and observations.    Preliminary     PHYSICAL EXAM  Temp:  [97.6 F (36.4 C)-99.7 F (37.6  C)] 98.9 F (37.2 C) (10/02 0733) Pulse Rate:  [52-65] 65 (10/02 0733) Resp:  [14-20] 20 (10/02 0733) BP: (126-176)/(58-84) 164/84 (10/02 0733) SpO2:  [96 %-100 %] 99 % (10/02 0733)  General - Well nourished, well developed, in no apparent distress.  Ophthalmologic - fundi not visualized due to noncooperation.  Cardiovascular - Regular rhythm and rate.  Neuro - awake alert, lying in bed, orientated to age and self, however not orientated to place, time.  Moderate dysarthria, however no aphasia, fluent language, able to name and repeat.  Follow most simple commands.  No gaze palsy, visual field fall.  Left facial droop.  Bilateral upper and lower extremity equal strengths.  Sensation symmetrical subjectively, finger-to-nose grossly intact bilaterally.     ASSESSMENT/PLAN DANNETTA LEKAS is a 87 y.o. female with a medical history  significant for type 2 diabetes mellitus with diabetic chronic kidney disease stage III, depression, GERD, and colon cancer s/p hemicolectomy in 2015 in remission who presented to the ED today via EMS for evaluation of left facial droop and slurred speech.  Patient had family visiting with her today and she was at her baseline status when they left just after 15:00. At 15:30 patient's husband noted that Ms. Iser had slurred speech and left-sided facial droop. He called his family that had just left from visiting to come and check on her and EMS was subsequently activated. CBG for EMS was 272 and patient was hypertensive with a systolic blood pressure in the 190's. Patient was able to communicate with providers that she has been feeling dizzy lately and has been under an increased amount of stress. BP was treated. CTH without evidence of hemorrhage, risks and benefits of treatment were discussed with patient and family and TNK was administered at 1739. No LVO was identified. She was admitted to the neurologic ICU for pos thrombolytic care and further work up.  CTA head and neck with moderate left cavernous ICA stenosis.  MRI showed right cortical frontal linear shaped infarct.  EF 60 to 65%, A1c 5.9, LDL 99.    Stroke - right frontal cortical infarct s/p TNK, embolic pattern, etiology unclear, concerning for occult atrial fibrillation Code Stroke CT head No acute abnormality. CTA head & neck No large vessel occlusion. Moderate left paraclinoid ICA stenosis.  MRI Small acute right frontal infarct. 2D Echo EF 60-65%   LE venous Doppler negative for DVT Recommend loop recorder placement to rule out A. fib LDL 99 HgbA1c 5.9 VTE prophylaxis - lovenox No anticoagulant or antiplatelet prior to admission, Now on ASA 81mg , Plavix 75 mg DAPT x 3 weeks, then ASA alone Therapy recommendations: SNF Disposition: Pending  Hypertension Home meds:  None  Stable BP goal less than 180/105  Consider starting  norvasc  Long-term BP goal normotensive  Hyperlipidemia Home meds: Zocor 20mg   LDL 99, goal < 70 increase home dose of Zocor to 40 mg Continue statin at discharge        Diabetes type II Controlled Home meds:  None  HgbA1c 5.9, goal < 7.0 CBGs SSI protocol        Asymptomatic bradycardia HR 40s -70s Continue cardiac monitoring Consider Loop recorder for further monitoring       AKI Cre 1.16->0.84->0.89 Monitor Daily labs       History of anxiety Xanax restarted per reported home regimen Monitor   Other Stroke Risk Factors Advanced Age >/= 31  Family hx stroke (Mother)  Other Active Problems Hypothyroidism, continue home dose of  synthroid   Hospital day # 3  Rosalin Hawking, MD PhD Stroke Neurology 08/31/2021 6:13 PM    To contact Stroke Continuity provider, please refer to [email or web address] After hours, contact General Neurology

## 2021-08-31 NOTE — TOC Initial Note (Addendum)
Transition of Care [facility]) - Initial/Assessment Note    Patient Details  Name: Anne [name] MRN: [id number] Date of Birth: 02-17-1929  Transition of Care [facility]) CM/SW Contact:    Coralee Pesa, [place] Phone Number: 08/31/2021, 3:18 PM  Clinical Narrative:                 CSW spoke with pt's spouse who was at bedside. Spouse noted that pt does not want to go to SNF, but the doctors and his son in law have stated that it is necessary, so he is agreeable. CSW went over SNF process, family has no preference on facility, but requests to stay in [place]. Pt has had 2 vaccines and one booster. Spouse requested CSW follow up with SIL, Ed.  CSW spoke with Ed who also confirmed interest in SNF with no preference. He was advised of medicare.gov and medicare coverage. He noted that he is pt's POA and that she has to go to rehab, as it is not safe for her to return home with husband for care. CSW will complete referral and fax out, TOC will follow up with bed offers.  Passr went to level 2, documents will need uploaded when FL2 and 30 day note are signed. Expected Discharge Plan: Skilled Nursing Facility Barriers to Discharge: SNF Pending bed offer, Insurance Authorization, Continued Medical Work up   Patient Goals and CMS Choice Patient states their goals for this hospitalization and ongoing recovery are:: Pt and family have the goal of returning home after SNF. CMS Medicare.gov Compare Post Acute Care list provided to:: Patient Represenative (must comment) (Son in Sports coach) Choice offered to / list presented to : [facility] POA / Guardian, Adult Children, Spouse  Expected Discharge Plan and Services Expected Discharge Plan: [place] Choice: [place] arrangements for the past 2 months: Single Family Home                                      Prior Living Arrangements/Services Living arrangements for the past 2 months: Single Family  Home Lives with:: Spouse Patient language and need for interpreter reviewed:: Yes Do you feel safe going back to the place where you live?: Yes      Need for Family Participation in Patient Care: Yes (Comment) Care giver support system in place?: Yes (comment)   Criminal Activity/Legal Involvement Pertinent to Current Situation/Hospitalization: No - Comment as needed  Activities of Daily Living      Permission Sought/Granted Permission sought to share information with : Family Supports Permission granted to share information with : Yes, Verbal Permission Granted  Share Information with NAME: Lampros Yingst, Ed [place]     Permission granted to share info w Relationship: Spouse, SIL/ HCPOA     Emotional Assessment Appearance:: Appears stated age Attitude/Demeanor/Rapport: Lethargic Affect (typically observed): Unable to Assess Orientation: : Oriented to Self Alcohol / Substance Use: Not Applicable Psych Involvement: No (comment)  Admission diagnosis:  Ischemic stroke [facility]) [I63.9] Patient Active Problem List   Diagnosis Date Noted   Ischemic stroke ([place]) 08/28/2021   Chest pain 05/18/2017   UTI (urinary tract infection) 07/24/2015   GERD (gastroesophageal reflux disease) 07/24/2015   Cancer of ascending colon ([place]) 12/19/2013   Colon cancer ([place]) 12/14/2013   Cholelithiasis 10/05/2011   Dysuria 09/18/2011   PCP:  Thressa Sheller, MD (Inactive) Pharmacy:  [facility], [address] [place] [address] Phone: [phone] Fax: [phone]  [id number] [place], [address] AT [address] [place] [address] Phone: [phone] Fax: [phone]  [id number] - [place], [place] - [address] AT [address] [place] [address] Phone: [phone] Fax: [phone]     Social  Determinants of Health (SDOH) Interventions    Readmission Risk Interventions No flowsheet data found.

## 2021-08-31 NOTE — Social Work (Signed)
To Whom It May Concern:  Please be advised that the above-named patient will require a short-term nursing home stay - anticipated 30 days or less for rehabilitation and strengthening.  The plan is for return home.  

## 2021-08-31 NOTE — Progress Notes (Signed)
Physical Therapy Treatment Patient Details Name: Anne [name] MRN: [id number] DOB: 1929/01/22 Today's Date: 08/31/2021   History of Present Illness 87 yo female admitted with CT no evidence of infarct or hemorrhage admitted with slurred speech hypertension and hyperglycemia.  She had CT negative for bleed so now s/p TNK. MRI Small acute right frontal infarct. PMH GERD colon CA    PT Comments    Pt with lethargy today and flat affect however pt with agitation and hallucination throughout the night and suspect is fatigued. Pt requiring tactile cues to facility transfers and ambulation due to delayed response time and impaired sequencing. Acute PT to cont to follow.    Recommendations for follow up therapy are one component of a multi-disciplinary discharge planning process, led by the attending physician.  Recommendations may be updated based on patient status, additional functional criteria and insurance authorization.  Follow Up Recommendations  SNF     Equipment Recommendations  None recommended by PT    Recommendations for Other Services       Precautions / Restrictions Restrictions Weight Bearing Restrictions: No     Mobility  Bed Mobility Overal bed mobility: Needs Assistance Bed Mobility: Supine to Sit     Supine to sit: Min assist     General bed mobility comments: pt initiated transfer, minA to guide and complete trunk elevation, increased time    Transfers Overall transfer level: Needs assistance Equipment used:  (face to face with pt's hands on PTs shld for tactile guidance) Transfers: Sit to/from Stand;Stand Pivot Transfers Sit to Stand: Min assist Stand pivot transfers: Min assist       General transfer comment: minA to power up, minA to guide pt during std pvt to BSC, max directional step by step cues, pt slow to respond but followed commands  Ambulation/Gait Ambulation/Gait assistance: Min assist Gait Distance (Feet): 25 Feet Assistive device:  Rolling walker (2 wheeled) Gait Pattern/deviations: Step-to pattern;Decreased stride length;Shuffle;Decreased dorsiflexion - left Gait velocity: dec   General Gait Details: due to lethargy and slow response time, PT had pt place her hands on PTs shoulders (face to face) to provide stability and to allow for PT to progress forward motion and sequence stepping pattern   Stairs             Wheelchair Mobility    Modified Rankin (Stroke Patients Only) Modified Rankin (Stroke Patients Only) Pre-Morbid Rankin Score: Slight disability Modified Rankin: Moderately severe disability     Balance Overall balance assessment: Needs assistance Sitting-balance support: Feet supported;No upper extremity supported Sitting balance-Leahy Scale: Fair     Standing balance support: Single extremity supported Standing balance-Leahy Scale: Poor Standing balance comment: pt stood infront of BSC and performed pericare with min guard and L UE support while using R UE to perform pericare                            Cognition Arousal/Alertness: Lethargic Behavior During Therapy: Flat affect Overall Cognitive Status: No family/caregiver present to determine baseline cognitive functioning                                 General Comments: per chart and RN pt was hallucinating last night stating she was pregnant and needed to go home, pt lethargic/sleepy this morning, but easily awoken, decresaed response time but did follow simple commands with tactile guidance and increased time  Exercises      General Comments General comments (skin integrity, edema, etc.): pt assisted to commode, +urination, able to perform pericare      Pertinent Vitals/Pain Pain Assessment: Faces Faces Pain Scale: No hurt    Home Living                      Prior Function            PT Goals (current goals can now be found in the care plan section) Progress towards PT goals:  Progressing toward goals    Frequency    Min 3X/week      PT Plan Current plan remains appropriate    Co-evaluation              AM-PAC PT "6 Clicks" Mobility   Outcome Measure  Help needed turning from your back to your side while in a flat bed without using bedrails?: A Little Help needed moving from lying on your back to sitting on the side of a flat bed without using bedrails?: A Little Help needed moving to and from a bed to a chair (including a wheelchair)?: A Little Help needed standing up from a chair using your arms (e.g., wheelchair or bedside chair)?: A Little Help needed to walk in hospital room?: A Little Help needed climbing 3-5 steps with a railing? : A Lot 6 Click Score: 17    End of Session Equipment Utilized During Treatment: Gait belt Activity Tolerance: Patient tolerated treatment well Patient left: in chair;with call bell/phone within reach;with chair alarm set Nurse Communication: Mobility status PT Visit Diagnosis: Other abnormalities of gait and mobility (R26.89);Muscle weakness (generalized) (M62.81);Hemiplegia and hemiparesis;Other symptoms and signs involving the nervous system (R29.898) Hemiplegia - Right/Left: Left Hemiplegia - dominant/non-dominant: Non-dominant Hemiplegia - caused by: Cerebral infarction     Time: 0821-0845 PT Time Calculation (min) (ACUTE ONLY): 24 min  Charges:  $Gait Training: 8-22 mins $Therapeutic Activity: 8-22 mins                     Kittie Plater, PT, DPT Acute Rehabilitation Services Pager #: [phone] Office #: [phone]    Berline Lopes 08/31/2021, 11:32 AM

## 2021-09-01 LAB — GLUCOSE, CAPILLARY
Glucose-Capillary: 119 mg/dL — ABNORMAL HIGH (ref 70–99)
Glucose-Capillary: 114 mg/dL — ABNORMAL HIGH (ref 70–99)
Glucose-Capillary: 178 mg/dL — ABNORMAL HIGH (ref 70–99)
Glucose-Capillary: 97 mg/dL (ref 70–99)

## 2021-09-01 LAB — BASIC METABOLIC PANEL
Anion gap: 8 (ref 5–15)
BUN: 18 mg/dL (ref 8–23)
CO2: 27 mmol/L (ref 22–32)
Calcium: 8.9 mg/dL (ref 8.9–10.3)
Chloride: 105 mmol/L (ref 98–111)
Creatinine, Ser: 0.91 mg/dL (ref 0.44–1.00)
GFR, Estimated: 60 mL/min — ABNORMAL LOW (ref >60)
Glucose, Bld: 109 mg/dL — ABNORMAL HIGH (ref 70–99)
Potassium: 3.4 mmol/L — ABNORMAL LOW (ref 3.5–5.1)
Sodium: 140 mmol/L (ref 135–145)

## 2021-09-01 LAB — CBC
HCT: 43.7 % (ref 36.0–46.0)
Hemoglobin: 14.1 g/dL (ref 12.0–15.0)
MCH: 30.4 pg (ref 26.0–34.0)
MCHC: 32.3 g/dL (ref 30.0–36.0)
MCV: 94.2 fL (ref 80.0–100.0)
Platelets: 150 10*3/uL (ref 150–400)
RBC: 4.64 MIL/uL (ref 3.87–5.11)
RDW: 12.2 % (ref 11.5–15.5)
WBC: 5.4 10*3/uL (ref 4.0–10.5)
nRBC: 0 % (ref 0.0–0.2)

## 2021-09-01 NOTE — Plan of Care (Signed)
  Problem: Education: Goal: Knowledge of disease or condition will improve Outcome: Progressing Goal: Knowledge of secondary prevention will improve Outcome: Progressing Goal: Knowledge of patient specific risk factors addressed and post discharge goals established will improve Outcome: Progressing Goal: Individualized Educational Video(s) Outcome: Progressing   Problem: Coping: Goal: Will verbalize positive feelings about self Outcome: Progressing Goal: Will identify appropriate support needs Outcome: Progressing   Problem: Ischemic Stroke/TIA Tissue Perfusion: Goal: Complications of ischemic stroke/TIA will be minimized Outcome: Progressing

## 2021-09-01 NOTE — Progress Notes (Signed)
Speech Language Pathology Treatment: Dysphagia;Cognitive-Linquistic  Patient Details Name: Anne [name] MRN: [id number] DOB: [birth year] Today's Date: 09/01/2021 Time: 9233-0076 SLP Time Calculation (min) (ACUTE ONLY): 20 min  Assessment / Plan / Recommendation Clinical Impression  Pt was seen for swallowing and speech therapy targeting skilled observation of diet toleration, upgraded PO trials, and strategy implementation for increasing speech intelligibility. Overall, pt cognitively intact, able to describe the next steps in her hospital stay and upcoming evaluation. Speech intelligibility ~60% during session. SLP introduced strategies to increase intelligibility (overarticulation, pausing). Pt required verbal cues and models to implement strategies intermittently throughout session. Strategies increased intelligibility ~70%. SLP suspects strategy implementation would be more successful if completed separate from mealtime. SLP observed pt with Dys3/thin liquid meal tray and trialed regular solids. Pt exhibited no overt s/s of penetration or aspiration but exhibited prolonged mastication of regular solids. Recommend continue with Dys 3/thin liquid diet to limit prolonged mastication and energy expended during mealtime. Administer medication whole in puree. SLP will continue to follow for therapy targeting speech strategy implementation and upgraded PO trials as appropriate.    HPI HPI: 87 year old female with past medical history GERD colon cancer. CT no evidence of infarct or hemorrhage admitted with slurred speech. MRI Small acute right frontal infarct, Mild chronic small vessel ischemic disease and moderate cerebral  atrophy,chronic right cerebellar infarct.      SLP Plan  Continue with current plan of care      Recommendations for follow up therapy are one component of a multi-disciplinary discharge planning process, led by the attending physician.  Recommendations may be updated based on  patient status, additional functional criteria and insurance authorization.    Recommendations  Diet recommendations: Dysphagia 3 (mechanical soft);Thin liquid Liquids provided via: Straw;Cup Medication Administration: Whole meds with puree Supervision: Patient able to self feed Compensations: Slow rate;Small sips/bites Postural Changes and/or Swallow Maneuvers: Seated upright 90 degrees                Oral Care Recommendations: Oral care BID Follow up Recommendations: Other (comment) (TBD) SLP Visit Diagnosis: Dysarthria and anarthria (R47.1);Dysphagia, unspecified (R13.10) Plan: Continue with current plan of care       GO              Dewitt Rota, SLP-Student   Dewitt Rota  09/01/2021, 3:17 PM

## 2021-09-01 NOTE — Progress Notes (Signed)
Occupational Therapy Treatment Patient Details Name: Anne [name] MRN: [id number] DOB: [birth year] Today's Date: 09/01/2021   History of present illness 87 yo female admitted with CT no evidence of infarct or hemorrhage admitted with slurred speech hypertension and hyperglycemia.  She had CT negative for bleed so now s/p TNK. MRI Small acute right frontal infarct. PMH GERD colon CA   OT comments  Anne [name] is progressing well, she requested grooming tasks upon arrival. Pt required set up and verbal cues for grooming tasks with fair unsupported sitting balance. She benefited from simple one step commands and increased time to sequence all mobility with 1 person [facility] assist. Pt's husband arrived at the end of the session, he was supportive however pt became distracted and difficult to re-direct with increased assistance for safety and mobility. Pt continues to benefit from OT acutely. D/c plan remains appropriate.    Recommendations for follow up therapy are one component of a multi-disciplinary discharge planning process, led by the attending physician.  Recommendations may be updated based on patient status, additional functional criteria and insurance authorization.    Follow Up Recommendations  SNF (pt's son requesting pt go to [facility] at dc)    Equipment Recommendations  3 in 1 bedside commode       Precautions / Restrictions Precautions Precautions: Fall Restrictions Weight Bearing Restrictions: No       Mobility Bed Mobility Overal bed mobility: Needs Assistance Bed Mobility: Sit to Supine       Sit to supine: Min assist   General bed mobility comments: min A for trunk control and verbal cues for sit>supine    Transfers Overall transfer level: Needs assistance Equipment used: 1 person hand held assist;None Transfers: Sit to/from Stand Sit to Stand: Min assist         General transfer comment: fluctuating from min guard to min A    Balance Overall balance  assessment: Needs assistance Sitting-balance support: Feet supported;No upper extremity supported Sitting balance-Leahy Scale: Fair Sitting balance - Comments: while sitting on the edge of the chair to complete grooming   Standing balance support: Single extremity supported Standing balance-Leahy Scale: Poor Standing balance comment: assistance for balance with ambulation                           ADL either performed or assessed with clinical judgement   ADL Overall ADL's : Needs assistance/impaired     Grooming: Set up;Brushing hair Grooming Details (indicate cue type and reason): pt requested hair brushing at the start of the session, pt able to comb hair with RUE, required cues to initiate and sustain task           Functional mobility during ADLs: Minimal assistance;Cueing for safety General ADL Comments: min A for upright and OOB tasks, incrased cueing for sequencing and cognition. Pt does well with simple one step cues and increased time for cueing      Cognition Arousal/Alertness: Awake/alert Behavior During Therapy: Flat affect Overall Cognitive Status: No family/caregiver present to determine baseline cognitive functioning             General Comments: pt alert and following all instructions at time of session, still needing cues for safety with upright mobility        Exercises Exercises: Other exercises Other Exercises Other Exercises: sit-stand from recliner without use of UE x10 minA given due to posterior lean      General Comments VSS on RA  Pertinent Vitals/ Pain       Pain Assessment: Faces Faces Pain Scale: No hurt Pain Intervention(s): Monitored during session   Frequency  Min 2X/week        Progress Toward Goals  OT Goals(current goals can now be found in the care plan section)  Progress towards OT goals: Progressing toward goals  Acute Rehab OT Goals Patient Stated Goal: to be able to dance OT Goal Formulation:  With patient Time For Goal Achievement: 09/12/21 Potential to Achieve Goals: Good ADL Goals Pt Will Perform Grooming: with supervision;sitting Pt Will Perform Upper Body Bathing: with supervision;sitting Pt Will Perform Lower Body Bathing: with supervision;sit to/from stand Pt Will Transfer to Toilet: with supervision;bedside commode;ambulating Additional ADL Goal #1: pt will complete bed mobility supervision level as precursor to adls  Plan Discharge plan remains appropriate       AM-PAC OT "6 Clicks" Daily Activity     Outcome Measure   Help from another person eating meals?: A Little Help from another person taking care of personal grooming?: A Little Help from another person toileting, which includes using toliet, bedpan, or urinal?: A Little Help from another person bathing (including washing, rinsing, drying)?: A Little Help from another person to put on and taking off regular upper body clothing?: A Little Help from another person to put on and taking off regular lower body clothing?: A Little 6 Click Score: 18    End of Session    OT Visit Diagnosis: Unsteadiness on feet (R26.81);Muscle weakness (generalized) (M62.81)   Activity Tolerance Patient tolerated treatment well   Patient Left in bed;with call bell/phone within reach;with bed alarm set;with family/visitor present   Nurse Communication Mobility status;Precautions        Time: 0762-2633 OT Time Calculation (min): 19 min  Charges: OT General Charges $OT Visit: 1 Visit OT Treatments $Self Care/Home Management : 8-22 mins   [name] A Dorethy Tomey 09/01/2021, 4:54 PM

## 2021-09-01 NOTE — Progress Notes (Signed)
Physical Therapy Treatment Patient Details Name: Anne [name] MRN: [id number] DOB: December 04, 1928 Today's Date: 09/01/2021   History of Present Illness 87 yo female admitted with CT no evidence of infarct or hemorrhage admitted with slurred speech hypertension and hyperglycemia.  She had CT negative for bleed so now s/p TNK. MRI Small acute right frontal infarct. PMH GERD colon CA    PT Comments    The pt continues to make good progress with ambulation distance and power to perform sit-stand transfers with less assist this session. She also presents with improved cognition, improved awareness of deficits and situation, but continues to need assist and cues to identify safety concerns and to make adjustments to mobility. The pt had multiple minor LOB completing sit-stand transfers as well as with ambulation that required minA to correct as she was unable to identify issue or make correction in time to prevent LOB. Continue to recommend SNF at this time as pt continues to need assist with upright activities at this time.     Recommendations for follow up therapy are one component of a multi-disciplinary discharge planning process, led by the attending physician.  Recommendations may be updated based on patient status, additional functional criteria and insurance authorization.  Follow Up Recommendations  SNF     Equipment Recommendations  None recommended by PT    Recommendations for Other Services       Precautions / Restrictions Precautions Precautions: Fall Restrictions Weight Bearing Restrictions: No     Mobility  Bed Mobility Overal bed mobility: Needs Assistance             General bed mobility comments: pt OOB in recliner at time of session    Transfers Overall transfer level: Needs assistance Equipment used: 1 person hand held assist;None Transfers: Sit to/from Stand Sit to Stand: Min assist         General transfer comment: minG with HHA, but minA due to  posterior bias with pt standing without use of UE for strengthening. pt able to complete x10 from recliner with no overt LOB, slight posterior lean needing minA or HHA to correct  Ambulation/Gait Ambulation/Gait assistance: Min assist Gait Distance (Feet): 75 Feet Assistive device: 1 person hand held assist Gait Pattern/deviations: Step-through pattern;Decreased stride length;Drifts right/left;Narrow base of support Gait velocity: dec Gait velocity interpretation: <1.31 ft/sec, indicative of household ambulator General Gait Details: pt with small strides and narrow BOS, no overt LOB but multiple small drifts to R and L needing minA to steady.    Modified Rankin (Stroke Patients Only) Pre-Morbid Rankin Score: Slight disability Modified Rankin: Moderately severe disability     Balance Overall balance assessment: Needs assistance Sitting-balance support: Feet supported;No upper extremity supported Sitting balance-Leahy Scale: Fair Sitting balance - Comments: at EOB and on toilet, able to perform toilet hyiene with S   Standing balance support: Single extremity supported Standing balance-Leahy Scale: Poor Standing balance comment: pt standing to adjust back of gown, needing cues to turn to face sink for UE support, pt with LOB attempting to static stand without UE support needing minA to recover                            Cognition Arousal/Alertness: Awake/alert Behavior During Therapy: Flat affect Overall Cognitive Status: No family/caregiver present to determine baseline cognitive functioning  General Comments: pt alert and following all instructions at time of session, still needing cues for safety with upright mobility      Exercises Other Exercises Other Exercises: sit-stand from recliner without use of UE x10 minA given due to posterior lean    General Comments General comments (skin integrity, edema, etc.): VSS  onRA      Pertinent Vitals/Pain Pain Assessment: Faces Faces Pain Scale: No hurt Pain Intervention(s): Monitored during session     PT Goals (current goals can now be found in the care plan section) Acute Rehab PT Goals Patient Stated Goal: to be able to dance PT Goal Formulation: Patient unable to participate in goal setting Time For Goal Achievement: 09/12/21 Potential to Achieve Goals: Fair Progress towards PT goals: Progressing toward goals    Frequency    Min 3X/week      PT Plan Current plan remains appropriate       AM-PAC PT "6 Clicks" Mobility   Outcome Measure  Help needed turning from your back to your side while in a flat bed without using bedrails?: A Little Help needed moving from lying on your back to sitting on the side of a flat bed without using bedrails?: A Little Help needed moving to and from a bed to a chair (including a wheelchair)?: A Little Help needed standing up from a chair using your arms (e.g., wheelchair or bedside chair)?: A Little Help needed to walk in hospital room?: A Little Help needed climbing 3-5 steps with a railing? : A Lot 6 Click Score: 17    End of Session Equipment Utilized During Treatment: Gait belt Activity Tolerance: Patient tolerated treatment well Patient left: in chair;with call bell/phone within reach;with chair alarm set Nurse Communication: Mobility status PT Visit Diagnosis: Other abnormalities of gait and mobility (R26.89);Muscle weakness (generalized) (M62.81);Hemiplegia and hemiparesis;Other symptoms and signs involving the nervous system (R29.898) Hemiplegia - Right/Left: Left Hemiplegia - dominant/non-dominant: Non-dominant Hemiplegia - caused by: Cerebral infarction     Time: 1534-1600 PT Time Calculation (min) (ACUTE ONLY): 26 min  Charges:  $Gait Training: 8-22 mins $Therapeutic Exercise: 8-22 mins                     West Carbo, PT, DPT   Acute Rehabilitation Department Pager #: [phone]   Sandra Cockayne 09/01/2021, 4:17 PM

## 2021-09-01 NOTE — TOC Progression Note (Signed)
Transition of Care [facility]) - Progression Note    Patient Details  Name: ORIE CUTTINO MRN: [id number] Date of Birth: 1929-11-25  Transition of Care [facility]) CM/SW [place], [place] Phone Number: 09/01/2021, 4:04 PM  Clinical Narrative:   CSW spoke with patient's son in law, Ed, earlier today to confirm interest in SNF, as patient and spouse told MD this morning that they wanted the patient to go home. Ed has power of attorney and the patient and spouse are unable to make decisions regarding her care, Ed and the rest of the family are agreeable to SNF. CSW has faxed out referral and will provide bed offers. CSW spoke with Ed again this afternoon to discuss bed offers. Ed to review options available and will discuss choice with CSW tomorrow. CSW sent in PASRR review information for PASRR number.     Expected Discharge Plan: [place] Barriers to Discharge: SNF Pending bed offer, Ship broker, Continued Medical Work up  Expected Discharge Plan and Services Expected Discharge Plan: [place] Choice: [place] arrangements for the past 2 months: Single Family Home                                       Social Determinants of Health (SDOH) Interventions    Readmission Risk Interventions No flowsheet data found.

## 2021-09-01 NOTE — Progress Notes (Signed)
STROKE TEAM PROGRESS NOTE   INTERVAL HISTORY No acute events. Husband at the bedside. PT recommend SNF and OT pending today. Pt now OK with short term SNF.   Vitals:   08/31/21 1534 08/31/21 2051 09/01/21 0044 09/01/21 0442  BP: (!) 156/79 123/72 138/68 (!) 151/72  Pulse: 69 70 (!) 57 (!) 59  Resp: 16 20 18 18   Temp: 98.2 F (36.8 C) 97.9 F (36.6 C) 98.2 F (36.8 C) 98 F (36.7 C)  TempSrc: Oral Oral Oral Oral  SpO2: 96%  99% 97%  Weight:      Height:       CBC:  Recent Labs  Lab 08/28/21 1720 08/28/21 1728 08/31/21 0255 09/01/21 0323  WBC 5.6   < > 6.7 5.4  NEUTROABS 3.4  --   --   --   HGB 14.4   < > 15.0 14.1  HCT 43.9   < > 46.3* 43.7  MCV 95.9   < > 94.7 94.2  PLT 131*   < > 148* 150   < > = values in this interval not displayed.   Basic Metabolic Panel:  Recent Labs  Lab 08/31/21 0255 09/01/21 0323  NA 142 140  K 3.9 3.4*  CL 110 105  CO2 23 27  GLUCOSE 112* 109*  BUN 15 18  CREATININE 0.92 0.91  CALCIUM 8.9 8.9   Lipid Panel:  Recent Labs  Lab 08/29/21 0218  CHOL 168  TRIG 57  HDL 58  CHOLHDL 2.9  VLDL 11  LDLCALC 99   HgbA1c:  Recent Labs  Lab 08/29/21 0218  HGBA1C 5.9*   Urine Drug Screen:  Recent Labs  Lab 08/28/21 2034  LABOPIA NONE DETECTED  COCAINSCRNUR NONE DETECTED  LABBENZ NONE DETECTED  AMPHETMU NONE DETECTED  THCU NONE DETECTED  LABBARB NONE DETECTED    Alcohol Level  Recent Labs  Lab 08/28/21 1941  ETH <10    IMAGING past 24 hours No results found.  PHYSICAL EXAM  Temp:  [97.8 F (36.6 C)-98.9 F (37.2 C)] 98 F (36.7 C) (10/03 0442) Pulse Rate:  [57-70] 59 (10/03 0442) Resp:  [16-20] 18 (10/03 0442) BP: (123-164)/(68-84) 151/72 (10/03 0442) SpO2:  [96 %-99 %] 97 % (10/03 0442)  General - Well nourished, well developed, in no apparent distress.  Ophthalmologic - fundi not visualized due to noncooperation.  Cardiovascular - Regular rhythm and rate.  Neuro - awake alert, lying in bed, orientated  to age, place and people, however not orientated to time.  Moderate dysarthria, however no aphasia, fluent language, able to name and repeat.  Follow simple commands.  No gaze palsy, visual field fall.  Left facial droop.  Bilateral upper and lower extremity equal strengths.  Sensation symmetrical subjectively, finger-to-nose grossly intact bilaterally. Gait not tested.    ASSESSMENT/PLAN PAISLIE TESSLER is a 87 y.o. female with a medical history significant for type 2 diabetes mellitus with diabetic chronic kidney disease stage III, depression, GERD, and colon cancer s/p hemicolectomy in 2015 in remission who presented to the ED today via EMS for evaluation of left facial droop and slurred speech.  Patient had family visiting with her today and she was at her baseline status when they left just after 15:00. At 15:30 patient's husband noted that [name] had slurred speech and left-sided facial droop. He called his family that had just left from visiting to come and check on her and EMS was subsequently activated. CBG for EMS was 272 and patient  was hypertensive with a systolic blood pressure in the 190's. Patient was able to communicate with providers that she has been feeling dizzy lately and has been under an increased amount of stress. BP was treated. CTH without evidence of hemorrhage, risks and benefits of treatment were discussed with patient and family and TNK was administered at 1739. No LVO was identified. She was admitted to the neurologic ICU for pos thrombolytic care and further work up.  CTA head and neck with moderate left cavernous ICA stenosis.  MRI showed right cortical frontal linear shaped infarct.  EF 60 to 65%, A1c 5.9, LDL 99.    Stroke - right frontal cortical infarct s/p TNK, embolic pattern, etiology unclear, concerning for occult atrial fibrillation Code Stroke CT head No acute abnormality. CTA head & neck No large vessel occlusion. Moderate left paraclinoid ICA stenosis.   MRI Small acute right frontal infarct. 2D Echo EF 60-65%   LE venous Doppler negative for DVT Recommend loop recorder placement to rule out A. Fib before discharge LDL 99 HgbA1c 5.9 VTE prophylaxis - lovenox No anticoagulant or antiplatelet prior to admission, Now on ASA 81mg , Plavix 75 mg DAPT x 3 weeks, then ASA alone Therapy recommendations: SNF Disposition: Pending  Hypertension Home meds:  None  Stable BP goal less than 180/105  Consider starting norvasc  Long-term BP goal normotensive  Hyperlipidemia Home meds: Zocor 20mg   LDL 99, goal < 70 increase home dose of Zocor to 40 mg Continue statin at discharge        Diabetes type II Controlled Home meds:  None  HgbA1c 5.9, goal < 7.0 CBGs SSI protocol        Asymptomatic bradycardia HR 40s -70s Continue cardiac monitoring Consider Loop recorder for further monitoring before discharge       AKI Cre 1.16->0.84->0.89->0.91 Monitor Daily labs       History of anxiety Xanax restarted per reported home regimen Monitor   Other Stroke Risk Factors Advanced Age >/= 62  Family hx stroke (Mother)  Other Active Problems Hypothyroidism, continue home dose of synthroid   Hospital day # 4  Rosalin Hawking, MD PhD Stroke Neurology 09/01/2021 3:15 PM  To contact Stroke Continuity provider, please refer to [email or web address] After hours, contact General Neurology

## 2021-09-02 LAB — GLUCOSE, CAPILLARY
Glucose-Capillary: 110 mg/dL — ABNORMAL HIGH (ref 70–99)
Glucose-Capillary: 119 mg/dL — ABNORMAL HIGH (ref 70–99)
Glucose-Capillary: 122 mg/dL — ABNORMAL HIGH (ref 70–99)
Glucose-Capillary: 180 mg/dL — ABNORMAL HIGH (ref 70–99)

## 2021-09-02 LAB — CBC
HCT: 43.2 % (ref 36.0–46.0)
Hemoglobin: 14 g/dL (ref 12.0–15.0)
MCH: 30.6 pg (ref 26.0–34.0)
MCHC: 32.4 g/dL (ref 30.0–36.0)
MCV: 94.3 fL (ref 80.0–100.0)
Platelets: 163 10*3/uL (ref 150–400)
RBC: 4.58 MIL/uL (ref 3.87–5.11)
RDW: 12.3 % (ref 11.5–15.5)
WBC: 5.5 10*3/uL (ref 4.0–10.5)
nRBC: 0 % (ref 0.0–0.2)

## 2021-09-02 LAB — BASIC METABOLIC PANEL
Anion gap: 9 (ref 5–15)
BUN: 27 mg/dL — ABNORMAL HIGH (ref 8–23)
CO2: 23 mmol/L (ref 22–32)
Calcium: 8.8 mg/dL — ABNORMAL LOW (ref 8.9–10.3)
Chloride: 106 mmol/L (ref 98–111)
Creatinine, Ser: 1.08 mg/dL — ABNORMAL HIGH (ref 0.44–1.00)
GFR, Estimated: 49 mL/min — ABNORMAL LOW (ref >60)
Glucose, Bld: 113 mg/dL — ABNORMAL HIGH (ref 70–99)
Potassium: 3.6 mmol/L (ref 3.5–5.1)
Sodium: 138 mmol/L (ref 135–145)

## 2021-09-02 MED ORDER — ACETAMINOPHEN 325 MG PO TABS: 650.0000 mg | ORAL_TABLET | ORAL | 0 refills | Status: DC | PRN

## 2021-09-02 MED ORDER — CLOPIDOGREL BISULFATE 75 MG PO TABS: 75.0000 mg | ORAL_TABLET | Freq: Every day | ORAL | 0 refills | Status: DC

## 2021-09-02 MED ORDER — ALPRAZOLAM 0.25 MG PO TABS: 0.2500 mg | ORAL_TABLET | Freq: Every day | ORAL | 0 refills | Status: DC

## 2021-09-02 MED ORDER — AMLODIPINE BESYLATE 5 MG PO TABS: 5.0000 mg | ORAL_TABLET | Freq: Every day | ORAL | 0 refills | Status: DC

## 2021-09-02 MED ORDER — SENNOSIDES-DOCUSATE SODIUM 8.6-50 MG PO TABS: 1.0000 | ORAL_TABLET | Freq: Every evening | ORAL | 0 refills | Status: DC | PRN

## 2021-09-02 MED ORDER — INSULIN ASPART 100 UNIT/ML IJ SOLN: 0.0000 [IU] | Freq: Three times a day (TID) | INTRAMUSCULAR | 11 refills | Status: DC

## 2021-09-02 MED ORDER — ASPIRIN 81 MG PO TBEC: 81.0000 mg | DELAYED_RELEASE_TABLET | Freq: Every day | ORAL | 11 refills | Status: DC

## 2021-09-02 MED ORDER — LEVOTHYROXINE SODIUM 75 MCG PO TABS: 75.0000 ug | ORAL_TABLET | Freq: Every day | ORAL | 0 refills | Status: DC

## 2021-09-02 MED ORDER — ATORVASTATIN CALCIUM 20 MG PO TABS: 20.0000 mg | ORAL_TABLET | Freq: Every day | ORAL | 1 refills | Status: DC

## 2021-09-02 NOTE — Plan of Care (Signed)
  Problem: Education: Goal: Knowledge of disease or condition will improve Outcome: Progressing Goal: Knowledge of secondary prevention will improve Outcome: Progressing Goal: Knowledge of patient specific risk factors addressed and post discharge goals established will improve Outcome: Progressing Goal: Individualized Educational Video(s) Outcome: Progressing   Problem: Ischemic Stroke/TIA Tissue Perfusion: Goal: Complications of ischemic stroke/TIA will be minimized Outcome: Progressing   Problem: Safety: Goal: Non-violent Restraint(s) Outcome: Progressing

## 2021-09-02 NOTE — Progress Notes (Addendum)
STROKE TEAM PROGRESS NOTE   INTERVAL HISTORY No acute events.  No one is at bedside at time of this exam. Patient is pending SNF placement. Will need loop prior to transfer.   Vitals:   09/01/21 1956 09/02/21 0015 09/02/21 0418 09/02/21 0839  BP: (!) 141/78 133/60 (!) 149/72 (!) 150/76  Pulse: 74 (!) 55 (!) 54 (!) 58  Resp: 18 17 18 15   Temp: 97.9 F (36.6 C) 98 F (36.7 C) 98 F (36.7 C) 98.3 F (36.8 C)  TempSrc: Oral Oral Oral Oral  SpO2: 100% 97% 96% 99%  Weight:      Height:       CBC:  Recent Labs  Lab 08/28/21 1720 08/28/21 1728 09/01/21 0323 09/02/21 0245  WBC 5.6   < > 5.4 5.5  NEUTROABS 3.4  --   --   --   HGB 14.4   < > 14.1 14.0  HCT 43.9   < > 43.7 43.2  MCV 95.9   < > 94.2 94.3  PLT 131*   < > 150 163   < > = values in this interval not displayed.    Basic Metabolic Panel:  Recent Labs  Lab 09/01/21 0323 09/02/21 0245  NA 140 138  K 3.4* 3.6  CL 105 106  CO2 27 23  GLUCOSE 109* 113*  BUN 18 27*  CREATININE 0.91 1.08*  CALCIUM 8.9 8.8*    Lipid Panel:  Recent Labs  Lab 08/29/21 0218  CHOL 168  TRIG 57  HDL 58  CHOLHDL 2.9  VLDL 11  LDLCALC 99    HgbA1c:  Recent Labs  Lab 08/29/21 0218  HGBA1C 5.9*    Urine Drug Screen:  Recent Labs  Lab 08/28/21 2034  LABOPIA NONE DETECTED  COCAINSCRNUR NONE DETECTED  LABBENZ NONE DETECTED  AMPHETMU NONE DETECTED  THCU NONE DETECTED  LABBARB NONE DETECTED     Alcohol Level  Recent Labs  Lab 08/28/21 1941  ETH <10     IMAGING past 24 hours No results found.  PHYSICAL EXAM  Temp:  [97.8 F (36.6 C)-98.3 F (36.8 C)] 98.3 F (36.8 C) (10/04 0839) Pulse Rate:  [54-74] 58 (10/04 0839) Resp:  [15-19] 15 (10/04 0839) BP: (133-161)/(60-81) 150/76 (10/04 0839) SpO2:  [96 %-100 %] 99 % (10/04 0839)  General - Well nourished, well developed, in no apparent distress.  Ophthalmologic - fundi not visualized due to noncooperation.  Cardiovascular - Regular rhythm and  rate.  Neuro - awake alert, sitting up in chair. Appears oriented to person, place and people, however not orientated to time.  Moderate dysarthria, however no aphasia, fluent language, able to name and repeat.  Follow simple commands.  No gaze palsy, visual field fall.  minimal Left facial droop.  Bilateral upper and lower extremity equal strengths.  Sensation symmetrical subjectively, finger-to-nose grossly intact bilaterally. Gait not tested.    ASSESSMENT/PLAN Anne [name] is a 87 y.o. female with a medical history significant for type 2 diabetes mellitus with diabetic chronic kidney disease stage III, depression, GERD, and colon cancer s/p hemicolectomy in 2015 in remission who presented to the ED today via EMS for evaluation of left facial droop and slurred speech.  Patient had family visiting with her today and she was at her baseline status when they left just after 15:00. At 15:30 patient's husband noted that Ms. Biscardi had slurred speech and left-sided facial droop. He called his family that had just left from visiting to come and  check on her and EMS was subsequently activated. CBG for EMS was 272 and patient was hypertensive with a systolic blood pressure in the 190's. Patient was able to communicate with providers that she has been feeling dizzy lately and has been under an increased amount of stress. BP was treated. CTH without evidence of hemorrhage, risks and benefits of treatment were discussed with patient and family and TNK was administered at 1739. No LVO was identified. She was admitted to the neurologic ICU for pos thrombolytic care and further work up.  CTA head and neck with moderate left cavernous ICA stenosis.  MRI showed right cortical frontal linear shaped infarct.  EF 60 to 65%, A1c 5.9, LDL 99.    Stroke - right frontal cortical infarct s/p TNK, embolic pattern, etiology unclear, concerning for occult atrial fibrillation Code Stroke CT head No acute abnormality. CTA  head & neck No large vessel occlusion. Moderate left paraclinoid ICA stenosis.  MRI Small acute right frontal infarct. 2D Echo EF 60-65%   LE venous Doppler negative for DVT Pt refused loop recorder and agreed with 30 day cardiac event monitoring as oupt LDL 99 HgbA1c 5.9 VTE prophylaxis - lovenox No anticoagulant or antiplatelet prior to admission, Now on ASA 81mg , Plavix 75 mg DAPT x 3 weeks, then ASA alone Therapy recommendations: SNF Disposition: Pending  Hypertension Home meds:  None  Stable BP goal less than 180/105  Consider starting norvasc  Long-term BP goal normotensive  Hyperlipidemia Home meds: Zocor 20mg   LDL 99, goal < 70 increase home dose of Zocor to 40 mg Continue statin at discharge        Diabetes type II Controlled Home meds:  None  HgbA1c 5.9, goal < 7.0 CBGs SSI protocol        Asymptomatic bradycardia HR 40s -70s Continue cardiac monitoring Pt refused loop recorder and agreed with 30 day cardiac event monitoring as oupt       AKI Cre 1.16->0.84->0.89->0.91 Monitor Daily labs       History of anxiety Xanax restarted per reported home regimen Monitor   Other Stroke Risk Factors Advanced Age >/= 44  Family hx stroke (Mother)  Other Active Problems Hypothyroidism, continue home dose of synthroid   Hospital day # 5  ATTENDING NOTE: I reviewed above note and agree with the assessment and plan. Pt was seen and examined.   Husband and daughter are at the bedside. Pt lying in bed, awake alert still has left facial droop and slurry speech. Neuro unchanged. Overnight no acute issue. EP on board, pt declined loop recorder but OK with 30 day monitoring. PT/OT recommend SNF and pt agrees so far. Pending insurance approval.   For detailed assessment and plan, please refer to above as I have made changes wherever appropriate.   Rosalin Hawking, MD PhD Stroke Neurology 09/02/2021 3:50 PM    To contact Stroke Continuity provider, please refer to  [email or web address] After hours, contact General Neurology

## 2021-09-02 NOTE — Progress Notes (Signed)
Discussed with Patient, Husband, and Daughter indications for loop recorder and cardiac monitoring.   They are currently all in agreement and wish to proceed with an event monitor first, and follow up as outpatient once the patient has healed more from her stroke and is closer to making decisions for herself.   I will arrange and place a full note in the chart on the day of discharge, and which time we will re-visit the discussion with family.   Legrand Como [address] [place], PA-C  09/02/2021 11:59 AM

## 2021-09-02 NOTE — TOC Progression Note (Signed)
Transition of Care [facility]) - Progression Note    Patient Details  Name: Anne [name] MRN: [id number] Date of Birth: 1929-11-30  Transition of Care [facility]) CM/SW [place], [place] Phone Number: 09/02/2021, 1:03 PM  Clinical Narrative:   CSW following for discharge to SNF. Patient's PASRR has been received. CSW spoke with [facility] to discuss bed availability and they can accept patient tomorrow. CSW spoke with patient's POA Ed and he confirmed accepting bed offer at [facility]. CSW sent request to initiate insurance authorization. CSW to follow.    Expected Discharge Plan: [place] Barriers to Discharge: SNF Pending bed offer, Ship broker, Continued Medical Work up  Expected Discharge Plan and Services Expected Discharge Plan: [place] Choice: [place] arrangements for the past 2 months: Single Family Home                                       Social Determinants of Health (SDOH) Interventions    Readmission Risk Interventions No flowsheet data found.

## 2021-09-03 ENCOUNTER — Other Ambulatory Visit: Payer: Self-pay | Admitting: *Deleted

## 2021-09-03 ENCOUNTER — Inpatient Hospital Stay: Payer: Medicare Other

## 2021-09-03 ENCOUNTER — Other Ambulatory Visit: Payer: Self-pay | Admitting: Student

## 2021-09-03 ENCOUNTER — Inpatient Hospital Stay (HOSPITAL_COMMUNITY)
Admit: 2021-09-03 | Discharge: 2021-09-03 | Disposition: A | Discharge status: Home / Self Care | Payer: Medicare Other | Attending: Student | Admitting: Student

## 2021-09-03 DIAGNOSIS — R001 Bradycardia, unspecified: Secondary | ICD-10-CM

## 2021-09-03 DIAGNOSIS — I639 Cerebral infarction, unspecified: Secondary | ICD-10-CM | POA: Diagnosis not present

## 2021-09-03 DIAGNOSIS — Z7901 Long term (current) use of anticoagulants: Secondary | ICD-10-CM | POA: Diagnosis not present

## 2021-09-03 DIAGNOSIS — R531 Weakness: Secondary | ICD-10-CM | POA: Diagnosis not present

## 2021-09-03 DIAGNOSIS — I10 Essential (primary) hypertension: Secondary | ICD-10-CM | POA: Diagnosis not present

## 2021-09-03 DIAGNOSIS — R52 Pain, unspecified: Secondary | ICD-10-CM | POA: Diagnosis not present

## 2021-09-03 DIAGNOSIS — Z7401 Bed confinement status: Secondary | ICD-10-CM | POA: Diagnosis not present

## 2021-09-03 DIAGNOSIS — K5900 Constipation, unspecified: Secondary | ICD-10-CM | POA: Diagnosis not present

## 2021-09-03 DIAGNOSIS — Z85038 Personal history of other malignant neoplasm of large intestine: Secondary | ICD-10-CM | POA: Diagnosis not present

## 2021-09-03 DIAGNOSIS — I6350 Cerebral infarction due to unspecified occlusion or stenosis of unspecified cerebral artery: Secondary | ICD-10-CM

## 2021-09-03 DIAGNOSIS — F32A Depression, unspecified: Secondary | ICD-10-CM | POA: Diagnosis not present

## 2021-09-03 DIAGNOSIS — E785 Hyperlipidemia, unspecified: Secondary | ICD-10-CM | POA: Diagnosis not present

## 2021-09-03 DIAGNOSIS — K219 Gastro-esophageal reflux disease without esophagitis: Secondary | ICD-10-CM | POA: Diagnosis not present

## 2021-09-03 DIAGNOSIS — E039 Hypothyroidism, unspecified: Secondary | ICD-10-CM | POA: Diagnosis not present

## 2021-09-03 DIAGNOSIS — Z743 Need for continuous supervision: Secondary | ICD-10-CM | POA: Diagnosis not present

## 2021-09-03 DIAGNOSIS — Z79899 Other long term (current) drug therapy: Secondary | ICD-10-CM | POA: Diagnosis not present

## 2021-09-03 DIAGNOSIS — E559 Vitamin D deficiency, unspecified: Secondary | ICD-10-CM | POA: Diagnosis not present

## 2021-09-03 DIAGNOSIS — E119 Type 2 diabetes mellitus without complications: Secondary | ICD-10-CM | POA: Diagnosis not present

## 2021-09-03 DIAGNOSIS — N390 Urinary tract infection, site not specified: Secondary | ICD-10-CM | POA: Diagnosis not present

## 2021-09-03 LAB — GLUCOSE, CAPILLARY
Glucose-Capillary: 111 mg/dL — ABNORMAL HIGH (ref 70–99)
Glucose-Capillary: 105 mg/dL — ABNORMAL HIGH (ref 70–99)

## 2021-09-03 LAB — CBC
HCT: 44.3 % (ref 36.0–46.0)
Hemoglobin: 14.5 g/dL (ref 12.0–15.0)
MCH: 31.3 pg (ref 26.0–34.0)
MCHC: 32.7 g/dL (ref 30.0–36.0)
MCV: 95.5 fL (ref 80.0–100.0)
Platelets: 146 10*3/uL — ABNORMAL LOW (ref 150–400)
RBC: 4.64 MIL/uL (ref 3.87–5.11)
RDW: 12.3 % (ref 11.5–15.5)
WBC: 6.1 10*3/uL (ref 4.0–10.5)
nRBC: 0 % (ref 0.0–0.2)

## 2021-09-03 LAB — BASIC METABOLIC PANEL
Anion gap: 10 (ref 5–15)
BUN: 26 mg/dL — ABNORMAL HIGH (ref 8–23)
CO2: 23 mmol/L (ref 22–32)
Calcium: 8.9 mg/dL (ref 8.9–10.3)
Chloride: 108 mmol/L (ref 98–111)
Creatinine, Ser: 0.98 mg/dL (ref 0.44–1.00)
GFR, Estimated: 55 mL/min — ABNORMAL LOW (ref >60)
Glucose, Bld: 111 mg/dL — ABNORMAL HIGH (ref 70–99)
Potassium: 3.9 mmol/L (ref 3.5–5.1)
Sodium: 141 mmol/L (ref 135–145)

## 2021-09-03 LAB — RESP PANEL BY RT-PCR (FLU A&B, COVID) ARPGX2
Influenza A by PCR: NEGATIVE
Influenza B by PCR: NEGATIVE
SARS Coronavirus 2 by RT PCR: NEGATIVE

## 2021-09-03 MED ORDER — POLYVINYL ALCOHOL 1.4 % OP SOLN
2.0000 [drp] | OPHTHALMIC | Status: DC | PRN
  Filled 2021-09-03: qty 15

## 2021-09-03 NOTE — Progress Notes (Signed)
Speech Language Pathology Treatment: Dysphagia  Patient Details Name: Anne [name] MRN: [id number] DOB: 04-23-1929 Today's Date: 09/03/2021 Time: 4098-1191 SLP Time Calculation (min) (ACUTE ONLY): 29 min  Assessment / Plan / Recommendation Clinical Impression  SLP faciliated session by assisting dental brushing with moderate verbal cues to swish and expectorate.  Pt expectorated chicken and carrots from left sulci - which was not obviously present upon visual inspection.  Instructed pt to importance of oral care and expectoration for airway protection.  Sensory deficits continue on pt's left labial/facial region as pt reports frequently "biting her lip".  Provided pt with visual cue of mirror for awareness of sensory deficits.    Regarding tolerance of po, pt admits to h/o "choking" on liquids - even PTA. SLP instructed pt to take small bites/sips with mod I for teach back.    Dysarthria goals addressed via verbal/visual cues to slow rate of speech - as pt is requiring to repeat herself approximately 80% of opportunities.    HPI HPI: 87 year old female with past medical history GERD colon cancer. CT no evidence of infarct or hemorrhage admitted with slurred speech. MRI Small acute right frontal infarct, Mild chronic small vessel ischemic disease and moderate cerebral  atrophy,chronic right cerebellar infarct.      SLP Plan  Continue with current plan of care      Recommendations for follow up therapy are one component of a multi-disciplinary discharge planning process, led by the attending physician.  Recommendations may be updated based on patient status, additional functional criteria and insurance authorization.    Recommendations  Diet recommendations: Dysphagia 3 (mechanical soft);Thin liquid Liquids provided via: Straw;Cup Medication Administration: Whole meds with puree Supervision: Patient able to self feed Compensations: Slow rate;Small sips/bites;Lingual sweep for  clearance of pocketing (swish and expectorate water after meals) Postural Changes and/or Swallow Maneuvers: Seated upright 90 degrees                Oral Care Recommendations: Oral care BID Follow up Recommendations: Other (comment) SLP Visit Diagnosis: Dysarthria and anarthria (R47.1);Dysphagia, unspecified (R13.10) Plan: Continue with current plan of care       GO              Kathleen Lime, MS [place] Office [phone] Pager [phone]   Macario Golds  09/03/2021, 8:29 AM

## 2021-09-03 NOTE — Care Management Important Message (Signed)
Important Message  Patient Details  Name: Anne [name] MRN: [id number] Date of Birth: 1928-12-11   Medicare Important Message Given:  Yes     Kalin Amrhein 09/03/2021, 7:28 AM

## 2021-09-03 NOTE — Addendum Note (Signed)
Encounter addended by: Markus Daft A on: 09/03/2021 1:25 PM  Actions taken: Imaging Exam begun

## 2021-09-03 NOTE — Discharge Summary (Addendum)
Stroke Discharge Summary  Patient ID: Anne [name]   MRN: [id number]      DOB: [birth year]  Date of Admission: 08/28/2021 Date of Discharge: 09/03/2021  Attending Physician:  Stroke, Md, MD, Stroke MD Consultant(s):    None   Patient's PCP:  Thressa Sheller, MD (Inactive)  DISCHARGE DIAGNOSIS:   Active Problems:   Stroke - right frontal cortical infarct s/p TNK, embolic pattern, etiology unclear, concerning for occult atrial fibrillation  HTN  HLD  Anxiety    Allergies as of 09/03/2021   No Known Allergies      Medication List     STOP taking these medications    feeding supplement (GLUCERNA SHAKE) Liqd   simvastatin 20 MG tablet Commonly known as: ZOCOR       TAKE these medications    ALPRAZolam 0.25 MG tablet Commonly known as: XANAX Take 0.125 mg by mouth daily.   amLODipine 5 MG tablet Commonly known as: NORVASC Take 1 tablet (5 mg total) by mouth daily.   aspirin 81 MG EC tablet Take 1 tablet (81 mg total) by mouth daily. Swallow whole.   atorvastatin 20 MG tablet Commonly known as: LIPITOR Take 1 tablet (20 mg total) by mouth daily at 6 PM.   clopidogrel 75 MG tablet Commonly known as: PLAVIX Take 1 tablet (75 mg total) by mouth daily.   ELDERBERRY PO Take 5 mLs by mouth. "boost immune system"   levothyroxine 75 MCG tablet Commonly known as: SYNTHROID Take 75 mcg by mouth daily before breakfast.   polyethylene glycol 17 g packet Commonly known as: MIRALAX / GLYCOLAX Take 17 g by mouth daily as needed for mild constipation (constipation).   traMADol 50 MG tablet Commonly known as: ULTRAM Take 50 mg by mouth every 6 (six) hours as needed for moderate pain.   Vitamin D3 25 MCG (1000 UT) Caps Take 1,000 Units by mouth daily.        LABORATORY STUDIES CBC    Component Value Date/Time   WBC 6.1 09/03/2021 0235   RBC 4.64 09/03/2021 0235   HGB 14.5 09/03/2021 0235   HGB 14.7 07/24/2015 1044   HCT 44.3 09/03/2021 0235    HCT 44.8 07/24/2015 1044   PLT 146 (L) 09/03/2021 0235   PLT 165 07/24/2015 1044   MCV 95.5 09/03/2021 0235   MCV 93.4 07/24/2015 1044   MCH 31.3 09/03/2021 0235   MCHC 32.7 09/03/2021 0235   RDW 12.3 09/03/2021 0235   RDW 13.1 07/24/2015 1044   LYMPHSABS 1.8 08/28/2021 1720   LYMPHSABS 1.9 07/24/2015 1044   MONOABS 0.4 08/28/2021 1720   MONOABS 0.5 07/24/2015 1044   EOSABS 0.1 08/28/2021 1720   EOSABS 0.1 07/24/2015 1044   BASOSABS 0.0 08/28/2021 1720   BASOSABS 0.0 07/24/2015 1044   CMP    Component Value Date/Time   NA 141 09/03/2021 0235   NA 142 07/24/2015 1044   K 3.9 09/03/2021 0235   K 3.7 07/24/2015 1044   CL 108 09/03/2021 0235   CO2 23 09/03/2021 0235   CO2 27 07/24/2015 1044   GLUCOSE 111 (H) 09/03/2021 0235   GLUCOSE 103 07/24/2015 1044   BUN 26 (H) 09/03/2021 0235   BUN 25.1 07/24/2015 1044   CREATININE 0.98 09/03/2021 0235   CREATININE 1.0 07/24/2015 1044   CALCIUM 8.9 09/03/2021 0235   CALCIUM 9.3 07/24/2015 1044   PROT 6.1 (L) 08/29/2021 0218   PROT 7.0 07/24/2015 1044   ALBUMIN 3.2 (L)  08/29/2021 0218   ALBUMIN 3.7 07/24/2015 1044   AST 15 08/29/2021 0218   AST 16 07/24/2015 1044   ALT 11 08/29/2021 0218   ALT 15 07/24/2015 1044   ALKPHOS 45 08/29/2021 0218   ALKPHOS 57 07/24/2015 1044   BILITOT 0.7 08/29/2021 0218   BILITOT 0.66 07/24/2015 1044   GFRNONAA 55 (L) 09/03/2021 0235   GFRAA 65 (L) 11/02/2014 1421   COAGS Lab Results  Component Value Date   INR 1.1 08/28/2021   Lipid Panel    Component Value Date/Time   CHOL 168 08/29/2021 0218   TRIG 57 08/29/2021 0218   HDL 58 08/29/2021 0218   CHOLHDL 2.9 08/29/2021 0218   VLDL 11 08/29/2021 0218   LDLCALC 99 08/29/2021 0218   HgbA1C  Lab Results  Component Value Date   HGBA1C 5.9 (H) 08/29/2021   Urinalysis    Component Value Date/Time   COLORURINE STRAW (A) 08/28/2021 2034   APPEARANCEUR CLEAR 08/28/2021 2034   LABSPEC 1.021 08/28/2021 2034   LABSPEC 1.020 07/24/2015 1044    PHURINE 7.0 08/28/2021 2034   GLUCOSEU 50 (A) 08/28/2021 2034   GLUCOSEU Negative 07/24/2015 1044   HGBUR MODERATE (A) 08/28/2021 2034   BILIRUBINUR NEGATIVE 08/28/2021 2034   BILIRUBINUR Negative 07/24/2015 [place] 08/28/2021 2034   PROTEINUR NEGATIVE 08/28/2021 2034   UROBILINOGEN 0.2 07/24/2015 1044   NITRITE NEGATIVE 08/28/2021 2034   LEUKOCYTESUR NEGATIVE 08/28/2021 2034   LEUKOCYTESUR Trace 07/24/2015 1044   Urine Drug Screen     Component Value Date/Time   LABOPIA NONE DETECTED 08/28/2021 2034   COCAINSCRNUR NONE DETECTED 08/28/2021 2034   LABBENZ NONE DETECTED 08/28/2021 2034   AMPHETMU NONE DETECTED 08/28/2021 2034   THCU NONE DETECTED 08/28/2021 2034   LABBARB NONE DETECTED 08/28/2021 2034    Alcohol Level    Component Value Date/Time   ETH <10 08/28/2021 1941     SIGNIFICANT DIAGNOSTIC STUDIES MR BRAIN WO CONTRAST  Result Date: 08/29/2021 CLINICAL DATA:  Stroke follow-up.  Post TNK. EXAM: MRI HEAD WITHOUT CONTRAST TECHNIQUE: Multiplanar, multiecho pulse sequences of the brain and surrounding structures were obtained without intravenous contrast. COMPARISON:  Head CT and CTA 08/28/2021 FINDINGS: Brain: There is a small acute right MCA territory infarct involving cortex and subcortical white matter in the posterior right frontal lobe. No intracranial hemorrhage, mass, midline shift, or extra-axial fluid collection is identified. T2 hyperintensities in the cerebral white matter bilaterally and in the pons are nonspecific but compatible with mild chronic small vessel ischemic disease. There is a small chronic right cerebellar infarct. There is moderate cerebral atrophy. Vascular: Major intracranial vascular flow voids are preserved. Skull and upper cervical spine: Unremarkable. Sinuses/Orbits: Unremarkable orbits. Paranasal sinuses and mastoid air cells are clear. Other: None. IMPRESSION: 1. Small acute right frontal infarct. 2. Mild chronic small vessel  ischemic disease and moderate cerebral atrophy. 3. Chronic right cerebellar infarct. Electronically Signed   By: Logan Bores M.D.   On: 08/29/2021 17:27   ECHOCARDIOGRAM COMPLETE  Result Date: 08/29/2021    ECHOCARDIOGRAM REPORT   Patient Name:   Anne [name] Date of Exam: 08/29/2021 Medical Rec #:  [id number]          Height:       64.0 in Accession #:    [id number]         Weight:       138.7 lb Date of Birth:  [birth year]          BSA:  1.674 m Patient Age:    33 years           BP:           170/68 mmHg Patient Gender: F                  HR:           51 bpm. Exam Location:  Inpatient Procedure: 2D Echo, 3D Echo, Cardiac Doppler and Color Doppler Indications:    Stroke I63.9  History:        Patient has no prior history of Echocardiogram examinations.                 Chronic kidney disease. GERD. Thyroid disease.  Sonographer:    Darlina Sicilian RDCS Referring Phys: [id number] [place]  1. Left ventricular ejection fraction, by estimation, is 60 to 65%. Left ventricular ejection fraction by 3D volume is 62 %. The left ventricle has normal function. The left ventricle has no regional wall motion abnormalities. Left ventricular diastolic  parameters are consistent with Grade I diastolic dysfunction (impaired relaxation).  2. Right ventricular systolic function is normal. The right ventricular size is normal. There is normal pulmonary artery systolic pressure. The estimated right ventricular systolic pressure is 56.2 mmHg.  3. Abnormal LA resevoir strain at 11.1%.  4. The mitral valve is abnormal. Trivial mitral valve regurgitation.  5. The aortic valve is tricuspid. Aortic valve regurgitation is mild to moderate.  6. The inferior vena cava is normal in size with greater than 50% respiratory variability, suggesting right atrial pressure of 3 mmHg. Comparison(s): No prior Echocardiogram. FINDINGS  Left Ventricle: Left ventricular ejection fraction, by estimation, is 60 to 65%. Left  ventricular ejection fraction by 3D volume is 62 %. The left ventricle has normal function. The left ventricle has no regional wall motion abnormalities. The left ventricular internal cavity size was normal in size. There is no left ventricular hypertrophy. Left ventricular diastolic parameters are consistent with Grade I diastolic dysfunction (impaired relaxation). Indeterminate filling pressures. Right Ventricle: The right ventricular size is normal. No increase in right ventricular wall thickness. Right ventricular systolic function is normal. There is normal pulmonary artery systolic pressure. The tricuspid regurgitant velocity is 2.35 m/s, and  with an assumed right atrial pressure of 3 mmHg, the estimated right ventricular systolic pressure is 13.0 mmHg. Left Atrium: Abnormal LA resevoir strain at 11.1%. Left atrial size was normal in size. Right Atrium: Right atrial size was normal in size. Pericardium: There is no evidence of pericardial effusion. Mitral Valve: The mitral valve is abnormal. There is mild thickening of the mitral valve leaflet(s). Trivial mitral valve regurgitation. MV peak gradient, 0.3 mmHg. The mean mitral valve gradient is 0.0 mmHg. Tricuspid Valve: The tricuspid valve is grossly normal. Tricuspid valve regurgitation is trivial. Aortic Valve: The aortic valve is tricuspid. Aortic valve regurgitation is mild to moderate. Aortic regurgitation PHT measures 872 msec. Pulmonic Valve: The pulmonic valve was grossly normal. Pulmonic valve regurgitation is trivial. Aorta: The aortic root and ascending aorta are structurally normal, with no evidence of dilitation. Venous: The inferior vena cava is normal in size with greater than 50% respiratory variability, suggesting right atrial pressure of 3 mmHg. IAS/Shunts: No atrial level shunt detected by color flow Doppler.  LEFT VENTRICLE PLAX 2D LVIDd:         4.80 cm         Diastology LVIDs:         3.40  cm         LV e' medial:    3.60 cm/s LV PW:          0.70 cm         LV E/e' medial:  11.1 LV IVS:        1.00 cm         LV e' lateral:   3.30 cm/s LVOT diam:     2.00 cm         LV E/e' lateral: 12.2 LV SV:         57 LV SV Index:   34 LVOT Area:     3.14 cm        3D Volume EF                                LV 3D EF:    Left                                             ventricul                                             ar                                             ejection                                             fraction                                             by 3D                                             volume is                                             62 %.                                 3D Volume EF:                                3D EF:        62 %                                LV EDV:       121 ml  LV ESV:       46 ml                                LV SV:        75 ml RIGHT VENTRICLE RV S prime:     13.20 cm/s TAPSE (M-mode): 1.6 cm LEFT ATRIUM             Index       RIGHT ATRIUM           Index LA diam:        3.80 cm 2.27 cm/m  RA Area:     10.10 cm LA Vol (A2C):   32.9 ml 19.65 ml/m RA Volume:   14.70 ml  8.78 ml/m LA Vol (A4C):   45.7 ml 27.29 ml/m LA Biplane Vol: 39.1 ml 23.35 ml/m  AORTIC VALVE LVOT Vmax:   73.90 cm/s LVOT Vmean:  46.300 cm/s LVOT VTI:    0.182 m AI PHT:      872 msec  AORTA Ao Root diam: 3.10 cm Ao Asc diam:  3.60 cm MITRAL VALVE               TRICUSPID VALVE MV Area (PHT): 2.22 cm    TR Peak grad:   22.1 mmHg MV Area VTI:   6.95 cm    TR Vmax:        235.00 cm/s MV Peak grad:  0.3 mmHg MV Mean grad:  0.0 mmHg    SHUNTS MV Vmax:       0.26 m/s    Systemic VTI:  0.18 m MV Vmean:      15.5 cm/s   Systemic Diam: 2.00 cm MV Decel Time: 342 msec MV E velocity: 40.10 cm/s MV A velocity: 40.40 cm/s MV E/A ratio:  0.99 Lyman Bishop MD Electronically signed by Lyman Bishop MD Signature Date/Time: 08/29/2021/2:55:14 PM    Final    CT HEAD CODE STROKE WO CONTRAST  Result Date:  08/28/2021 CLINICAL DATA:  Code stroke.  Neuro deficit, acute, stroke suspected EXAM: CT HEAD WITHOUT CONTRAST TECHNIQUE: Contiguous axial images were obtained from the base of the skull through the vertex without intravenous contrast. COMPARISON:  CT Head September 05, 2019. FINDINGS: Brain: No evidence of acute infarction, hemorrhage, hydrocephalus, extra-axial collection or mass lesion/mass effect. Similar mild patchy white matter hypodensities, nonspecific but compatible with chronic microvascular ischemic disease. Similar moderate atrophy with prominence of the extra-axial spaces. Vascular: No hyperdense vessel identified. Calcific intracranial atherosclerosis. Skull: No acute fracture. Sinuses/Orbits: Visualized sinuses are clear.  Unremarkable orbits. Other: No mastoid effusions. ASPECTS [facility] Stroke Program Early CT Score) total score (0-10 with 10 being normal): 10. IMPRESSION: 1. No evidence of acute large vascular territory infarct or acute hemorrhage. ASPECTS is 10. 2. Similar atrophy and chronic microvascular ischemic disease. Code stroke imaging results were communicated on 08/28/2021 at 5:34 pm to provider Dr. Leonel Ramsay via secure text paging. Electronically Signed   By: Margaretha Sheffield M.D.   On: 08/28/2021 17:35   VAS [place] LOWER EXTREMITY VENOUS (DVT)  Result Date: 08/31/2021  Lower Venous DVT Study Patient Name:  Anne [name]  Date of Exam:   08/30/2021 Medical Rec #: [id number]           Accession #:    [id number] Date of Birth: 1929/11/22           Patient Gender: F Patient Age:  89 years Exam Location:  [facility] Procedure:      VAS [place] LOWER EXTREMITY VENOUS (DVT) Referring Phys: Cornelius Moras Green[place] Rarick --------------------------------------------------------------------------------  Indications: Stroke.  Limitations: Patient's inability to fully follow directions. Comparison Study: No prior study on file Performing Technologist: Sharion Dove RVS  Examination Guidelines: A complete  evaluation includes B-mode imaging, spectral Doppler, color Doppler, and power Doppler as needed of all accessible portions of each vessel. Bilateral testing is considered an integral part of a complete examination. Limited examinations for reoccurring indications may be performed as noted. The reflux portion of the exam is performed with the patient in reverse Trendelenburg.  +---------+---------------+---------+-----------+----------+--------------+ RIGHT    CompressibilityPhasicitySpontaneityPropertiesThrombus Aging +---------+---------------+---------+-----------+----------+--------------+ CFV      Full           Yes      Yes                                 +---------+---------------+---------+-----------+----------+--------------+ SFJ      Full                                                        +---------+---------------+---------+-----------+----------+--------------+ FV Prox  Full                                                        +---------+---------------+---------+-----------+----------+--------------+ FV Mid   Full                                                        +---------+---------------+---------+-----------+----------+--------------+ FV DistalFull                                                        +---------+---------------+---------+-----------+----------+--------------+ PFV      Full                                                        +---------+---------------+---------+-----------+----------+--------------+ POP      Full           Yes      Yes                                 +---------+---------------+---------+-----------+----------+--------------+ PTV      Full                                                        +---------+---------------+---------+-----------+----------+--------------+  PERO     Full                                                         +---------+---------------+---------+-----------+----------+--------------+   +---------+---------------+---------+-----------+----------+--------------+ LEFT     CompressibilityPhasicitySpontaneityPropertiesThrombus Aging +---------+---------------+---------+-----------+----------+--------------+ CFV      Full           Yes      Yes                                 +---------+---------------+---------+-----------+----------+--------------+ SFJ      Full                                                        +---------+---------------+---------+-----------+----------+--------------+ FV Prox  Full                                                        +---------+---------------+---------+-----------+----------+--------------+ FV Mid   Full                                                        +---------+---------------+---------+-----------+----------+--------------+ FV DistalFull                                                        +---------+---------------+---------+-----------+----------+--------------+ PFV      Full                                                        +---------+---------------+---------+-----------+----------+--------------+ POP      Full           Yes      Yes                                 +---------+---------------+---------+-----------+----------+--------------+ PTV      Full                                                        +---------+---------------+---------+-----------+----------+--------------+ PERO     Full                                                        +---------+---------------+---------+-----------+----------+--------------+  Summary: BILATERAL: - No evidence of deep vein thrombosis seen in the lower extremities, bilaterally. -   *See table(s) above for measurements and observations. Electronically signed by Harold Barban MD on 08/31/2021 at 9:30:22 AM.    Final    CT ANGIO HEAD NECK W WO CM  (CODE STROKE)  Result Date: 08/28/2021 CLINICAL DATA:  Focal neuro deficit, > 6 hrs, stroke suspected EXAM: CT ANGIOGRAPHY HEAD AND NECK TECHNIQUE: Multidetector CT imaging of the head and neck was performed using the standard protocol during bolus administration of intravenous contrast. Multiplanar CT image reconstructions and MIPs were obtained to evaluate the vascular anatomy. Carotid stenosis measurements (when applicable) are obtained utilizing NASCET criteria, using the distal internal carotid diameter as the denominator. CONTRAST:  22mL OMNIPAQUE IOHEXOL 350 MG/ML SOLN COMPARISON:  None. FINDINGS: CTA NECK FINDINGS Aortic arch: Great vessel origins are patent. Right carotid system: No evidence of dissection, stenosis (50% or greater) or occlusion. Mild atherosclerosis at the carotid bifurcation. Tortuous Left carotid system: No evidence of dissection, stenosis (50% or greater) or occlusion. Mild atherosclerosis of the ICA. Tortuous. Vertebral arteries: Streak artifact from refluxed venous contrast in the neck obscures the left vertebral artery proximally. The left transverse foramen is poorly visualized in the right vertebral artery is prominent/dominant. Evaluation of the right vertebral artery is also limited proximally; however, the right vertebral artery remains patent without evidence of high-grade stenosis. Skeleton: Moderate multilevel degenerative change of the spine. Other neck: No acute findings. Upper chest: No consolidation in the visualized lung apices. Review of the MIP images confirms the above findings CTA HEAD FINDINGS Anterior circulation: Calcific atherosclerosis of bilateral intracranial ICAs with moderate stenosis of the left paraclinoid ICA. Bilateral M1 MCAs are patent. Bilateral proximal M2 MCAs are patent. Left A1 ACA is hypoplastic or absent, likely congenital. Otherwise, the ACAs are patent without proximal high-grade stenosis. No aneurysm identified. Posterior circulation:  Small/non dominant left vertebral artery, which is narrowed intradurally and appears to terminate as PICA. The right intradural vertebral artery is patent with mild-to-moderate narrowing. Patent basilar artery with mild distal narrowing. Bilateral PCAs are patent without proximal hemodynamically significant stenosis. No aneurysm identified. Venous sinuses: Poorly evaluated due to arterial timing. Anatomic variants: See above Review of the MIP images confirms the above findings IMPRESSION: 1. No large vessel occlusion. 2. Moderate left paraclinoid ICA stenosis. 3. Mild-to-moderate narrowing of the dominant right intradural vertebral artery and mild narrowing of the distal basilar artery. 4. Streak artifact from refluxed venous contrast in the neck obscures the proximal left vertebral artery. 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 The small left vertebral artery in the upper neck is opacified, but irregularly narrowed intradurally, likely terminating as PICA. Electronically Signed   By: Margaretha Sheffield M.D.   On: 08/28/2021 18:06      HISTORY OF PRESENT ILLNESS   Anne [name] is a 87 y.o. female with a medical history significant for type 2 diabetes mellitus with diabetic chronic kidney disease stage III, depression, GERD, and colon cancer s/p hemicolectomy in 2015 in remission who presented to the ED today via EMS for evaluation of left facial droop and slurred speech.  Patient had family visiting with her today and she was at her baseline status when they left just after 15:00. At 15:30 patient's husband noted that Ms. Kallstrom had slurred speech and left-sided facial droop. He called  his family that had just left from visiting to come and check on her and EMS was subsequently activated. CBG for EMS was 272 and patient was hypertensive with a systolic  blood pressure in the 190's. Patient was able to communicate with providers that she has been feeling dizzy lately and has been under an increased amount of stress.    LKW: 15:00 TNK given?: yes. CTH was reviewed by neurologist without evidence of hemorrhage, risks and benefits of treatment were discussed with patient and family and TNK was administered at 1739 after administering labetalol for blood pressure control. Blood pressure prior to administration was 180/73. IR Thrombectomy? No, presentation is not consistent with LVO, vessel imaging reviewed without LVO.  Modified Rankin Scale: 0-Completely asymptomatic and back to baseline post- stroke  MRI brain showed an acute small right frontal infarct as chronic right cerebellar infarct.   HOSPITAL COURSE  CTH without evidence of hemorrhage, risks and benefits of treatment were discussed with patient and family and TNK was administered at 1739. No LVO was identified. She was admitted to the neurologic ICU for pos thrombolytic care and further work up.  CTA head and neck with moderate left cavernous ICA stenosis.  MRI showed right cortical frontal linear shaped infarct.  EF 60 to 65%, A1c 5.9, LDL 99.     Stroke - right frontal cortical infarct s/p TNK, embolic pattern, etiology unclear, concerning for occult atrial fibrillation Code Stroke CT head No acute abnormality. CTA head & neck No large vessel occlusion. Moderate left paraclinoid ICA stenosis.  MRI Small acute right frontal infarct. 2D Echo EF 60-65%   LE venous Doppler negative for DVT Pt refused loop recorder and agreed with 30 day cardiac event monitoring as oupt LDL 99 HgbA1c 5.9 VTE prophylaxis - lovenox No anticoagulant or antiplatelet prior to admission, Now on ASA 81mg , Plavix 75 mg DAPT x 3 weeks, then ASA alone Therapy recommendations: SNF Disposition: Pending   Hypertension Home meds:  None  Stable BP goal less than 180/105  Consider starting norvasc  Long-term BP  goal normotensive   Hyperlipidemia Home meds: Zocor 20mg   LDL 99, goal < 70 On lipitor 20 Continue statin at discharge         Diabetes type II Controlled Home meds:  None  HgbA1c 5.9, goal < 7.0 CBGs SSI protocol         Asymptomatic bradycardia HR 40s -70s Continue cardiac monitoring Pt refused loop recorder and agreed with 30 day cardiac event monitoring as oupt        AKI Cre 1.16->0.84->0.89->0.91 Monitor Daily labs        History of anxiety Xanax restarted per reported home regimen Monitor    Other Stroke Risk Factors Advanced Age >/= 73  Family hx stroke (Mother)   Other Active Problems Hypothyroidism, continue home dose of synthroid    DISCHARGE EXAM Blood pressure 133/66, pulse (!) 50, temperature 98.1 F (36.7 C), temperature source Axillary, resp. rate 14, height 5\' 4"  (1.626 m), weight 62.9 kg, SpO2 99 %.  General - Well nourished, well developed, in no apparent distress.   Ophthalmologic - fundi not visualized due to noncooperation.   Cardiovascular - Regular rhythm and rate.   Neuro - awake alert, sitting up in chair. Appears oriented to person, place and people, however not orientated to time.  Moderate dysarthria, however no aphasia, fluent language, able to name and repeat.  Follow simple commands.  No gaze palsy, visual field fall.  minimal Left facial  droop.  Bilateral upper and lower extremity equal strengths.  Sensation symmetrical subjectively, finger-to-nose grossly intact bilaterally. Gait not tested.  Discharge Diet       Diet   DIET DYS 3 Room service appropriate? Yes; Fluid consistency: Thin   liquids  DISCHARGE PLAN Disposition:  snf aspirin 81 mg daily and clopidogrel 75 mg daily for secondary stroke prevention for 3 weeks then aspirin alone. Ongoing stroke risk factor control by Primary Care Physician at time of discharge Follow-up PCP Thressa Sheller, MD (Inactive) in 2 weeks. Follow-up in [facility] in 4 weeks, office to schedule an appointment.   35  minutes were spent preparing discharge.  Rosalin Hawking, MD PhD Stroke Neurology 09/03/2021 10:46 AM

## 2021-09-03 NOTE — Progress Notes (Signed)
Physical Therapy Treatment Patient Details Name: Anne [name] MRN: [id number] DOB: 1928-12-22 Today's Date: 09/03/2021   History of Present Illness 87 yo female admitted with CT no evidence of infarct or hemorrhage admitted with slurred speech hypertension and hyperglycemia.  She had CT negative for bleed so now s/p TNK. MRI Small acute right frontal infarct. PMH GERD colon CA    PT Comments    Pt received in supine, spouse present, pt pleasantly cooperative and with good participation and tolerance for short gait trials in room and seated/standing balance tasks. Pt performs bed mobility and transfers with up to minA and needs min to modA for gait distances 20-40ft in room with HHA. Pt with decreased L sided awareness and LUE decreased motor coordination. Pt with some slow processing vs internal distraction and needs mod cues/increased time to attend to commands, pt spouse present and a bit anxious/ impatient with her deficits. Encouraged him to give her time and stand to her L side more when speaking with her so she attends more to this side. Pt continues to benefit from PT services to progress toward functional mobility goals.   Recommendations for follow up therapy are one component of a multi-disciplinary discharge planning process, led by the attending physician.  Recommendations may be updated based on patient status, additional functional criteria and insurance authorization.  Follow Up Recommendations  SNF     Equipment Recommendations  None recommended by PT (defer to post-acute)    Recommendations for Other Services       Precautions / Restrictions Precautions Precautions: Fall Precaution Comments: decreased L awareness, L UE/LE weakness Restrictions Weight Bearing Restrictions: No     Mobility  Bed Mobility Overal bed mobility: Needs Assistance Bed Mobility: Supine to Sit     Supine to sit: Min assist     General bed mobility comments: min A for trunk control  and use of bed features, good initiation    Transfers Overall transfer level: Needs assistance Equipment used: 1 person hand held assist;None Transfers: Sit to/from Stand Sit to Stand: Min assist         General transfer comment: minA for stability upon rising  Ambulation/Gait Ambulation/Gait assistance: Min assist;Mod assist Gait Distance (Feet): 25 Feet (24ft, 30ft, 70ft) Assistive device: 1 person hand held assist Gait Pattern/deviations: Step-through pattern;Drifts right/left;Narrow base of support;Decreased step length - left;Decreased weight shift to left Gait velocity: dec   General Gait Details: pt with small strides and narrow BOS, needs up to modA while turning but mostly minA for forward stepping with light LUE HHA. Pt with decreased L sided awareness and needs cues to attend to objects on her L side, able to turn head and look toward midline more than able to gaze to L periphery. Pt with better stride and L step length when cued "LEFT foot, RIGHT foot" while stepping.    Modified Rankin (Stroke Patients Only) Modified Rankin (Stroke Patients Only) Pre-Morbid Rankin Score: Slight disability Modified Rankin: Moderately severe disability     Balance Overall balance assessment: Needs assistance Sitting-balance support: Feet supported;No upper extremity supported Sitting balance-Leahy Scale: Fair Sitting balance - Comments: while sitting on the edge of bed able to march and perform LAQ no LOB   Standing balance support: Single extremity supported Standing balance-Leahy Scale: Poor Standing balance comment: external assistance needed for balance during ambulation with U UE support              Cognition Arousal/Alertness: Awake/alert Behavior During Therapy: Impulsive Overall Cognitive  Status: Impaired/Different from baseline Area of Impairment: Memory;Safety/judgement;Problem solving       Memory: Decreased short-term memory   Safety/Judgement: Decreased  awareness of safety;Decreased awareness of deficits   Problem Solving: Difficulty sequencing;Requires verbal cues General Comments: pt alert and following all instructions at time of session, still needing cues for safety with upright mobility and noted decreased L awareness, encouraged her to use LUE as much as possible and encouraged her spouse to stand to her L side when speaking with her so she attends more to that side      Exercises General Exercises - Lower Extremity Ankle Circles/Pumps: AROM;Both;10 reps;Seated Long Arc Quad: AROM;Both;10 reps;Seated Hip Flexion/Marching: AROM;Both;10 reps;Seated    General Comments General comments (skin integrity, edema, etc.): no acute s/sx distress with exertion, HR WFL 70's bpm      Pertinent Vitals/Pain Pain Assessment: No/denies pain Faces Pain Scale: No hurt Pain Intervention(s): Monitored during session     PT Goals (current goals can now be found in the care plan section) Acute Rehab PT Goals Patient Stated Goal: to be able to dance PT Goal Formulation: Patient unable to participate in goal setting Time For Goal Achievement: 09/12/21 Progress towards PT goals: Progressing toward goals    Frequency    Min 3X/week      PT Plan Current plan remains appropriate       AM-PAC PT "6 Clicks" Mobility   Outcome Measure  Help needed turning from your back to your side while in a flat bed without using bedrails?: A Little Help needed moving from lying on your back to sitting on the side of a flat bed without using bedrails?: A Little Help needed moving to and from a bed to a chair (including a wheelchair)?: A Little Help needed standing up from a chair using your arms (e.g., wheelchair or bedside chair)?: A Little Help needed to walk in hospital room?: A Lot Help needed climbing 3-5 steps with a railing? : Total 6 Click Score: 15    End of Session Equipment Utilized During Treatment: Gait belt Activity Tolerance: Patient  tolerated treatment well Patient left: in chair;with call bell/phone within reach;with chair alarm set;with family/visitor present (spouse present, pt tray table set up in front of her) Nurse Communication: Mobility status PT Visit Diagnosis: Other abnormalities of gait and mobility (R26.89);Muscle weakness (generalized) (M62.81);Hemiplegia and hemiparesis;Other symptoms and signs involving the nervous system (R29.898) Hemiplegia - Right/Left: Left Hemiplegia - dominant/non-dominant: Non-dominant Hemiplegia - caused by: Cerebral infarction     Time: 5784-6962 PT Time Calculation (min) (ACUTE ONLY): 31 min  Charges:  $Gait Training: 8-22 mins $Therapeutic Activity: 8-22 mins                     Poonam Woehrle P., PTA Acute Rehabilitation Services Pager: [phone] Office: [place] 09/03/2021, 1:38 PM

## 2021-09-03 NOTE — TOC Transition Note (Signed)
Transition of Care [facility]) - CM/SW Discharge Note   Patient Details  Name: [name] MRN: [id number] Date of Birth: [birth year]  [facility] CM/SW Contact:  Geralynn Ochs, LCSW Phone Number: 09/03/2021, 1:04 PM   Clinical Narrative:   Nurse to call report to [phone], Room 207    Final next level of care: Skilled Nursing Facility Barriers to Discharge: Barriers Resolved   Patient Goals and CMS Choice Patient states their goals for this hospitalization and ongoing recovery are:: Pt and family have the goal of returning home after SNF. 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

## 2021-09-03 NOTE — Addendum Note (Signed)
Encounter addended by: Markus Daft A on: 09/03/2021 1:20 PM  Actions taken: Imaging Exam begun

## 2021-09-03 NOTE — Plan of Care (Signed)
See flowsheet for Tele updates. Problem: Education: Goal: Knowledge of disease or condition will improve Outcome: Progressing Goal: Knowledge of secondary prevention will improve Outcome: Progressing Goal: Knowledge of patient specific risk factors addressed and post discharge goals established will improve Outcome: Progressing Goal: Individualized Educational Video(s) Outcome: Progressing   Problem: Coping: Goal: Will verbalize positive feelings about self Outcome: Progressing Goal: Will identify appropriate support needs Outcome: Progressing   Problem: Health Behavior/Discharge Planning: Goal: Ability to manage health-related needs will improve Outcome: Progressing   Problem: Self-Care: Goal: Ability to participate in self-care as condition permits will improve Outcome: Progressing Goal: Verbalization of feelings and concerns over difficulty with self-care will improve Outcome: Progressing Goal: Ability to communicate needs accurately will improve Outcome: Progressing   Problem: Nutrition: Goal: Risk of aspiration will decrease Outcome: Progressing   Problem: Ischemic Stroke/TIA Tissue Perfusion: Goal: Complications of ischemic stroke/TIA will be minimized Outcome: Progressing   Problem: Safety: Goal: Non-violent Restraint(s) Outcome: Progressing

## 2021-09-03 NOTE — Addendum Note (Signed)
Encounter addended by: Markus Daft A on: 09/03/2021 4:45 PM  Actions taken: Imaging Exam begun

## 2021-09-03 NOTE — Progress Notes (Signed)
Pt had been sent via PTAR. All belongings have been sent and SBAR has been given to receiving facility.

## 2021-09-03 NOTE — Consult Note (Addendum)
ELECTROPHYSIOLOGY CONSULT NOTE  Patient ID: Anne [name] MRN: [id number], DOB/AGE: 07-02-1929   Admit date: 08/28/2021 Date of Consult: 09/03/2021  Primary Physician: Thressa Sheller, MD (Inactive) Primary Cardiologist: None  Primary Electrophysiologist: Dr. Caryl Comes Reason for Consultation: Cryptogenic stroke; recommendations regarding Implantable Loop Recorder Insurance: [employer]  History of Present Illness  Anne [name] is a 87 y.o. female asymptomatic bradycardia  She was previously seen 05/2017 by Dr. Caryl Comes for "slow heart rate". She had intermittent dizziness but was an overall poor historian. She described several non-specific episodes of presyncope, but when her symptoms were described by herself, her husband would interject saying those things had not happened.   Monitoring at that time showed profound sinus bradycardia almost entire monitoring, sinus pauses,frequent PACs, infrequent PVCs, infrequent very short runs of atrial tachycardia. Review by Dr. Caryl Comes showed junctional rhythm with notable bradycardia at one point where she was normally asleep. Sinus brady also noted into high 40s.  Pacemaker was not recommended with no clear symptoms.   EP has been asked to evaluate Lowella Dandy Strausbaugh for placement of an implantable loop recorder to monitor for atrial fibrillation by Dr Erlinda Hong.  The patient was admitted on 08/28/2021 with left facial droop and slurred speech.    Imaging demonstrated R frontal cortical infarct s/p TNK, embolic pattern, etiology unclear, concerning for occult AF  She has undergone workup for stroke:  Code Stroke CT head No acute abnormality. CTA head & neck No large vessel occlusion. Moderate left paraclinoid ICA stenosis.  MRI Small acute right frontal infarct. 2D Echo EF 60-65%   LE venous Doppler negative for DVT   The patient has been monitored on telemetry which has demonstrated sinus rhythm with no arrhythmias.  Inpatient stroke work-up  will not require a TEE per Neurology.   Echocardiogram as above. Lab work is reviewed.  Prior to admission, the patient denies chest pain, shortness of breath, or syncope. By chart, she has chronic non-specific and intermittent episodes of dizziness.  She is recovering from her stroke with plans to rehab at SNF  at discharge. She remains somewhat confused and required re-direction mid conversation at times.  Past Medical History:  Diagnosis Date   Abdominal distension    Abdominal pain    Anxiety    facial twitch and click , twitch lt shoulder  due to anxiety   Arthritis    right first finger   Chronic kidney disease    hx uti 1 mo ago was med tx    Constipation    Depression    GERD (gastroesophageal reflux disease)    Hypothyroidism    Itching    at times, area varies   No pertinent past medical history    OCC PAINS TO ABDOMEN AND CHEST , DR BYERLY PLACED PATIENT ON MED FOR GERD FOR 2 WEEKS   Thyroid disease      Surgical History:  Past Surgical History:  Procedure Laterality Date   ABDOMINAL HYSTERECTOMY     partial - patient does not remember date   APPENDECTOMY  1948   bladder tack     Patient does not remember date   CHOLECYSTECTOMY  12/30/2011   Procedure: LAPAROSCOPIC CHOLECYSTECTOMY WITH INTRAOPERATIVE CHOLANGIOGRAM;  Surgeon: Stark Mee Macdonnell, MD;  Location: [facility];  Service: General;  Laterality: N/A;   colonscopy  01-12-2014   LAPAROSCOPIC RIGHT HEMI COLECTOMY Right 12/19/2013   Procedure: LAPAROSCOPIC RIGHT HEMI COLECTOMY;  Surgeon: Shann Medal, MD;  Location: [facility];  Service: General;  Laterality: Right;   NOSE SURGERY     1970   vericose vein removal     legs. does not remember date of procedure     Medications Prior to Admission  Medication Sig Dispense Refill Last Dose   ALPRAZolam (XANAX) 0.25 MG tablet Take 0.125 mg by mouth daily.   08/28/2021   Cholecalciferol (VITAMIN D3) 1000 UNITS CAPS Take 1,000 Units by mouth daily.    08/28/2021   ELDERBERRY PO  Take 5 mLs by mouth. "boost immune system"   08/28/2021   levothyroxine (SYNTHROID) 75 MCG tablet Take 75 mcg by mouth daily before breakfast.   08/28/2021   polyethylene glycol (MIRALAX / GLYCOLAX) packet Take 17 g by mouth daily as needed for mild constipation (constipation).   Past Week   simvastatin (ZOCOR) 20 MG tablet Take 20 mg by mouth every evening.   08/28/2021   traMADol (ULTRAM) 50 MG tablet Take 50 mg by mouth every 6 (six) hours as needed for moderate pain.   Past Week   feeding supplement, GLUCERNA SHAKE, (GLUCERNA SHAKE) LIQD Take 237 mLs by mouth daily. (Patient not taking: Reported on 08/29/2021)   Not Taking    Inpatient Medications:    stroke: mapping our early stages of recovery book   Does not apply Once   ALPRAZolam  0.125 mg Oral BID   ALPRAZolam  0.25 mg Oral QHS   amLODipine  5 mg Oral Daily   aspirin EC  81 mg Oral Daily   atorvastatin  20 mg Oral q1800   chlorhexidine  15 mL Mouth Rinse BID   clopidogrel  75 mg Oral Daily   enoxaparin (LOVENOX) injection  30 mg Subcutaneous Q24H   insulin aspart  0-9 Units Subcutaneous TID WC   levothyroxine  75 mcg Oral Q0600   mouth rinse  15 mL Mouth Rinse q12n4p   pantoprazole  40 mg Oral Daily   sodium chloride flush  3 mL Intravenous Once    Allergies: No Known Allergies  Social History   Socioeconomic History   Marital status: Married    Spouse name: Not on file   Number of children: 3   Years of education: Not on file   Highest education level: Not on file  Occupational History   Not on file  Tobacco Use   Smoking status: Never   Smokeless tobacco: Never  Substance and Sexual Activity   Alcohol use: No   Drug use: No   Sexual activity: Not on file  Other Topics Concern   Not on file  Social History Narrative   Not on file   Social Determinants of Health   Financial Resource Strain: Not on file  Food Insecurity: Not on file  Transportation Needs: Not on file  Physical Activity: Not on file  Stress:  Not on file  Social Connections: Not on file  Intimate Partner Violence: Not on file     Family History  Problem Relation Age of Onset   Stroke Mother    Cancer Sister        breast   Cancer Brother        lung and thyroid      Review of Systems: All other systems reviewed and are otherwise negative except as noted above.  Physical Exam: Vitals:   09/02/21 2029 09/02/21 2347 09/03/21 0341 09/03/21 0741  BP: (!) 159/62 (!) 116/53 (!) 125/49 133/66  Pulse: 60 (!) 41 (!) 57 (!) 50  Resp: 16 15  16 14  Temp: (!) 97.5 F (36.4 C) 97.9 F (36.6 C) 98 F (36.7 C) 98.1 F (36.7 C)  TempSrc: Oral Oral Oral Axillary  SpO2:  97% 97% 99%  Weight:      Height:        GEN- The patient is well appearing, alert and oriented x 3 today.   Head- normocephalic, atraumatic Eyes-  Sclera clear, conjunctiva pink Ears- hearing intact Oropharynx- clear Neck- supple Lungs- Clear to ausculation bilaterally, normal work of breathing Heart- Regular rate and rhythm, no murmurs, rubs or gallops  GI- soft, NT, ND, + BS Extremities- no clubbing, cyanosis, or edema MS- no significant deformity or atrophy Skin- no rash or lesion Psych- euthymic mood, full affect   Labs:   Lab Results  Component Value Date   WBC 6.1 09/03/2021   HGB 14.5 09/03/2021   HCT 44.3 09/03/2021   MCV 95.5 09/03/2021   PLT 146 (L) 09/03/2021    Recent Labs  Lab 08/29/21 0218 08/30/21 0356 09/03/21 0235  NA 142   < > 141  K 3.6   < > 3.9  CL 109   < > 108  CO2 25   < > 23  BUN 14   < > 26*  CREATININE 0.84   < > 0.98  CALCIUM 8.7*   < > 8.9  PROT 6.1*  --   --   BILITOT 0.7  --   --   ALKPHOS 45  --   --   ALT 11  --   --   AST 15  --   --   GLUCOSE 120*   < > 111*   < > = values in this interval not displayed.     Radiology/Studies: MR BRAIN WO CONTRAST  Result Date: 08/29/2021 CLINICAL DATA:  Stroke follow-up.  Post TNK. EXAM: MRI HEAD WITHOUT CONTRAST TECHNIQUE: Multiplanar, multiecho pulse  sequences of the brain and surrounding structures were obtained without intravenous contrast. COMPARISON:  Head CT and CTA 08/28/2021 FINDINGS: Brain: There is a small acute right MCA territory infarct involving cortex and subcortical white matter in the posterior right frontal lobe. No intracranial hemorrhage, mass, midline shift, or extra-axial fluid collection is identified. T2 hyperintensities in the cerebral white matter bilaterally and in the pons are nonspecific but compatible with mild chronic small vessel ischemic disease. There is a small chronic right cerebellar infarct. There is moderate cerebral atrophy. Vascular: Major intracranial vascular flow voids are preserved. Skull and upper cervical spine: Unremarkable. Sinuses/Orbits: Unremarkable orbits. Paranasal sinuses and mastoid air cells are clear. Other: None. IMPRESSION: 1. Small acute right frontal infarct. 2. Mild chronic small vessel ischemic disease and moderate cerebral atrophy. 3. Chronic right cerebellar infarct. Electronically Signed   By: Logan Bores M.D.   On: 08/29/2021 17:27   ECHOCARDIOGRAM COMPLETE  Result Date: 08/29/2021    ECHOCARDIOGRAM REPORT   Patient Name:   SYRINA WAKE Date of Exam: 08/29/2021 Medical Rec #:  [id number]          Height:       64.0 in Accession #:    [id number]         Weight:       138.7 lb Date of Birth:  1929-01-16          BSA:          1.674 m Patient Age:    65 years           BP:  170/68 mmHg Patient Gender: F                  HR:           51 bpm. Exam Location:  Inpatient Procedure: 2D Echo, 3D Echo, Cardiac Doppler and Color Doppler Indications:    Stroke I63.9  History:        Patient has no prior history of Echocardiogram examinations.                 Chronic kidney disease. GERD. Thyroid disease.  Sonographer:    Darlina Sicilian RDCS Referring Phys: [id number] Enigma  1. Left ventricular ejection fraction, by estimation, is 60 to 65%. Left ventricular ejection  fraction by 3D volume is 62 %. The left ventricle has normal function. The left ventricle has no regional wall motion abnormalities. Left ventricular diastolic  parameters are consistent with Grade I diastolic dysfunction (impaired relaxation).  2. Right ventricular systolic function is normal. The right ventricular size is normal. There is normal pulmonary artery systolic pressure. The estimated right ventricular systolic pressure is 01.7 mmHg.  3. Abnormal LA resevoir strain at 11.1%.  4. The mitral valve is abnormal. Trivial mitral valve regurgitation.  5. The aortic valve is tricuspid. Aortic valve regurgitation is mild to moderate.  6. The inferior vena cava is normal in size with greater than 50% respiratory variability, suggesting right atrial pressure of 3 mmHg. Comparison(s): No prior Echocardiogram. FINDINGS  Left Ventricle: Left ventricular ejection fraction, by estimation, is 60 to 65%. Left ventricular ejection fraction by 3D volume is 62 %. The left ventricle has normal function. The left ventricle has no regional wall motion abnormalities. The left ventricular internal cavity size was normal in size. There is no left ventricular hypertrophy. Left ventricular diastolic parameters are consistent with Grade I diastolic dysfunction (impaired relaxation). Indeterminate filling pressures. Right Ventricle: The right ventricular size is normal. No increase in right ventricular wall thickness. Right ventricular systolic function is normal. There is normal pulmonary artery systolic pressure. The tricuspid regurgitant velocity is 2.35 m/s, and  with an assumed right atrial pressure of 3 mmHg, the estimated right ventricular systolic pressure is 51.0 mmHg. Left Atrium: Abnormal LA resevoir strain at 11.1%. Left atrial size was normal in size. Right Atrium: Right atrial size was normal in size. Pericardium: There is no evidence of pericardial effusion. Mitral Valve: The mitral valve is abnormal. There is mild  thickening of the mitral valve leaflet(s). Trivial mitral valve regurgitation. MV peak gradient, 0.3 mmHg. The mean mitral valve gradient is 0.0 mmHg. Tricuspid Valve: The tricuspid valve is grossly normal. Tricuspid valve regurgitation is trivial. Aortic Valve: The aortic valve is tricuspid. Aortic valve regurgitation is mild to moderate. Aortic regurgitation PHT measures 872 msec. Pulmonic Valve: The pulmonic valve was grossly normal. Pulmonic valve regurgitation is trivial. Aorta: The aortic root and ascending aorta are structurally normal, with no evidence of dilitation. Venous: The inferior vena cava is normal in size with greater than 50% respiratory variability, suggesting right atrial pressure of 3 mmHg. IAS/Shunts: No atrial level shunt detected by color flow Doppler.  LEFT VENTRICLE PLAX 2D LVIDd:         4.80 cm         Diastology LVIDs:         3.40 cm         LV e' medial:    3.60 cm/s LV PW:         0.70 cm  LV E/e' medial:  11.1 LV IVS:        1.00 cm         LV e' lateral:   3.30 cm/s LVOT diam:     2.00 cm         LV E/e' lateral: 12.2 LV SV:         57 LV SV Index:   34 LVOT Area:     3.14 cm        3D Volume EF                                LV 3D EF:    Left                                             ventricul                                             ar                                             ejection                                             fraction                                             by 3D                                             volume is                                             62 %.                                 3D Volume EF:                                3D EF:        62 %                                LV EDV:       121 ml                                LV ESV:       46  ml                                LV SV:        75 ml RIGHT VENTRICLE RV S prime:     13.20 cm/s TAPSE (M-mode): 1.6 cm LEFT ATRIUM             Index       RIGHT ATRIUM           Index LA  diam:        3.80 cm 2.27 cm/m  RA Area:     10.10 cm LA Vol (A2C):   32.9 ml 19.65 ml/m RA Volume:   14.70 ml  8.78 ml/m LA Vol (A4C):   45.7 ml 27.29 ml/m LA Biplane Vol: 39.1 ml 23.35 ml/m  AORTIC VALVE LVOT Vmax:   73.90 cm/s LVOT Vmean:  46.300 cm/s LVOT VTI:    0.182 m AI PHT:      872 msec  AORTA Ao Root diam: 3.10 cm Ao Asc diam:  3.60 cm MITRAL VALVE               TRICUSPID VALVE MV Area (PHT): 2.22 cm    TR Peak grad:   22.1 mmHg MV Area VTI:   6.95 cm    TR Vmax:        235.00 cm/s MV Peak grad:  0.3 mmHg MV Mean grad:  0.0 mmHg    SHUNTS MV Vmax:       0.26 m/s    Systemic VTI:  0.18 m MV Vmean:      15.5 cm/s   Systemic Diam: 2.00 cm MV Decel Time: 342 msec MV E velocity: 40.10 cm/s MV A velocity: 40.40 cm/s MV E/A ratio:  0.99 Lyman Bishop MD Electronically signed by Lyman Bishop MD Signature Date/Time: 08/29/2021/2:55:14 PM    Final    CT HEAD CODE STROKE WO CONTRAST  Result Date: 08/28/2021 CLINICAL DATA:  Code stroke.  Neuro deficit, acute, stroke suspected EXAM: CT HEAD WITHOUT CONTRAST TECHNIQUE: Contiguous axial images were obtained from the base of the skull through the vertex without intravenous contrast. COMPARISON:  CT Head September 05, 2019. FINDINGS: Brain: No evidence of acute infarction, hemorrhage, hydrocephalus, extra-axial collection or mass lesion/mass effect. Similar mild patchy white matter hypodensities, nonspecific but compatible with chronic microvascular ischemic disease. Similar moderate atrophy with prominence of the extra-axial spaces. Vascular: No hyperdense vessel identified. Calcific intracranial atherosclerosis. Skull: No acute fracture. Sinuses/Orbits: Visualized sinuses are clear.  Unremarkable orbits. Other: No mastoid effusions. ASPECTS [facility] Stroke Program Early CT Score) total score (0-10 with 10 being normal): 10. IMPRESSION: 1. No evidence of acute large vascular territory infarct or acute hemorrhage. ASPECTS is 10. 2. Similar atrophy and chronic  microvascular ischemic disease. Code stroke imaging results were communicated on 08/28/2021 at 5:34 pm to provider Dr. Leonel Ramsay via secure text paging. Electronically Signed   By: Margaretha Sheffield M.D.   On: 08/28/2021 17:35   VAS [place] LOWER EXTREMITY VENOUS (DVT)  Result Date: 08/31/2021  Lower Venous DVT Study Patient Name:  RHYLIE STEHR  Date of Exam:   08/30/2021 Medical Rec #: [id number]           Accession #:    [id number] Date of Birth: 17-Jan-1929           Patient Gender: F Patient Age:   20 years Exam Location:  [facility]  Procedure:      VAS [place] LOWER EXTREMITY VENOUS (DVT) Referring Phys: Cornelius Moras XU --------------------------------------------------------------------------------  Indications: Stroke.  Limitations: Patient's inability to fully follow directions. Comparison Study: No prior study on file Performing Technologist: Sharion Dove RVS  Examination Guidelines: A complete evaluation includes B-mode imaging, spectral Doppler, color Doppler, and power Doppler as needed of all accessible portions of each vessel. Bilateral testing is considered an integral part of a complete examination. Limited examinations for reoccurring indications may be performed as noted. The reflux portion of the exam is performed with the patient in reverse Trendelenburg.  +---------+---------------+---------+-----------+----------+--------------+ RIGHT    CompressibilityPhasicitySpontaneityPropertiesThrombus Aging +---------+---------------+---------+-----------+----------+--------------+ CFV      Full           Yes      Yes                                 +---------+---------------+---------+-----------+----------+--------------+ SFJ      Full                                                        +---------+---------------+---------+-----------+----------+--------------+ FV Prox  Full                                                         +---------+---------------+---------+-----------+----------+--------------+ FV Mid   Full                                                        +---------+---------------+---------+-----------+----------+--------------+ FV DistalFull                                                        +---------+---------------+---------+-----------+----------+--------------+ PFV      Full                                                        +---------+---------------+---------+-----------+----------+--------------+ POP      Full           Yes      Yes                                 +---------+---------------+---------+-----------+----------+--------------+ PTV      Full                                                        +---------+---------------+---------+-----------+----------+--------------+ PERO     Full                                                        +---------+---------------+---------+-----------+----------+--------------+   +---------+---------------+---------+-----------+----------+--------------+  LEFT     CompressibilityPhasicitySpontaneityPropertiesThrombus Aging +---------+---------------+---------+-----------+----------+--------------+ CFV      Full           Yes      Yes                                 +---------+---------------+---------+-----------+----------+--------------+ SFJ      Full                                                        +---------+---------------+---------+-----------+----------+--------------+ FV Prox  Full                                                        +---------+---------------+---------+-----------+----------+--------------+ FV Mid   Full                                                        +---------+---------------+---------+-----------+----------+--------------+ FV DistalFull                                                         +---------+---------------+---------+-----------+----------+--------------+ PFV      Full                                                        +---------+---------------+---------+-----------+----------+--------------+ POP      Full           Yes      Yes                                 +---------+---------------+---------+-----------+----------+--------------+ PTV      Full                                                        +---------+---------------+---------+-----------+----------+--------------+ PERO     Full                                                        +---------+---------------+---------+-----------+----------+--------------+     Summary: BILATERAL: - No evidence of deep vein thrombosis seen in the lower extremities, bilaterally. -   *See table(s) above for measurements and observations. Electronically signed by Harold Barban MD on 08/31/2021 at 9:30:22 AM.    Final  CT ANGIO HEAD NECK W WO CM (CODE STROKE)  Result Date: 08/28/2021 CLINICAL DATA:  Focal neuro deficit, > 6 hrs, stroke suspected EXAM: CT ANGIOGRAPHY HEAD AND NECK TECHNIQUE: Multidetector CT imaging of the head and neck was performed using the standard protocol during bolus administration of intravenous contrast. Multiplanar CT image reconstructions and MIPs were obtained to evaluate the vascular anatomy. Carotid stenosis measurements (when applicable) are obtained utilizing NASCET criteria, using the distal internal carotid diameter as the denominator. CONTRAST:  50mL OMNIPAQUE IOHEXOL 350 MG/ML SOLN COMPARISON:  None. FINDINGS: CTA NECK FINDINGS Aortic arch: Great vessel origins are patent. Right carotid system: No evidence of dissection, stenosis (50% or greater) or occlusion. Mild atherosclerosis at the carotid bifurcation. Tortuous Left carotid system: No evidence of dissection, stenosis (50% or greater) or occlusion. Mild atherosclerosis of the ICA. Tortuous. Vertebral arteries: Streak  artifact from refluxed venous contrast in the neck obscures the left vertebral artery proximally. The left transverse foramen is poorly visualized in the right vertebral artery is prominent/dominant. Evaluation of the right vertebral artery is also limited proximally; however, the right vertebral artery remains patent without evidence of high-grade stenosis. Skeleton: Moderate multilevel degenerative change of the spine. Other neck: No acute findings. Upper chest: No consolidation in the visualized lung apices. Review of the MIP images confirms the above findings CTA HEAD FINDINGS Anterior circulation: Calcific atherosclerosis of bilateral intracranial ICAs with moderate stenosis of the left paraclinoid ICA. Bilateral M1 MCAs are patent. Bilateral proximal M2 MCAs are patent. Left A1 ACA is hypoplastic or absent, likely congenital. Otherwise, the ACAs are patent without proximal high-grade stenosis. No aneurysm identified. Posterior circulation: Small/non dominant left vertebral artery, which is narrowed intradurally and appears to terminate as PICA. The right intradural vertebral artery is patent with mild-to-moderate narrowing. Patent basilar artery with mild distal narrowing. Bilateral PCAs are patent without proximal hemodynamically significant stenosis. No aneurysm identified. Venous sinuses: Poorly evaluated due to arterial timing. Anatomic variants: See above Review of the MIP images confirms the above findings IMPRESSION: 1. No large vessel occlusion. 2. Moderate left paraclinoid ICA stenosis. 3. Mild-to-moderate narrowing of the dominant right intradural vertebral artery and mild narrowing of the distal basilar artery. 4. Streak artifact from refluxed venous contrast in the neck obscures the proximal left vertebral artery. 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 The small left vertebral artery in the upper neck is opacified, but irregularly narrowed intradurally, likely terminating as PICA. Electronically Signed   By: Margaretha Sheffield M.D.   On: 08/28/2021 18:06    12-lead ECG on arrival showed sinus brady with mild 1st degree AV block at 204 ms PR interval (personally reviewed) All prior EKG's in EPIC reviewed with no documented atrial fibrillation  Telemetry Sinus brady/NSR 50-60s mostly, occasional lower sleeping (personally reviewed)  Assessment and Plan:  1. Cryptogenic stroke The patient presents with cryptogenic stroke.  The patient does not have a TEE planned for this AM.  I spoke at length with the patient about monitoring for afib with an implantable loop recorder.  Risks, benefits, and alteratives to implantable loop recorder were discussed with the patient today.   At this time, the patient refuses loop consideration, and has opted to wear an event monitor   2. H/o asymptomatic bradycardia Seen in 2018 after + Holter. Further care was deferred due to lack of symptoms. As above. All  family is in agreement they would like to avoid implantable devices based on patients past expressed wishes, and will proceed with event monitoring and outpatient follow up to review and discuss further.   Above discussed with Dr. Caryl Comes who will see the patient once family arrives given her confusion. We have been made aware she has a bed for today.   [name], PA-C 09/03/2021 10:18 AM  As above.  Patient with cryptogenic stroke history of bradycardia now the question is does she have atrial fibrillation.  She has been adamant in the past about implanted devices.  Hence, we will not pursue a loop recorder.  The thought was to place a surface monitor on her, as she is going to SNF, is much easier to place it here in the hospital so we will give her a 2-week Zio patch as opposed to a 30-day monitor which would have to be sent to the facility.   Moreover, the Zio patch is an M cot and so the data will be available to [place] whether the device is returned or not.  The question was then proffered as to what to do if no atrial fibrillation were detected.  Given the paucity of data as to the effectiveness of anticoagulation and atrial fibrillation identified by monitoring post cryptogenic stroke and given the patient's prior reluctance for implanted devices, I think we do nothing.  There are no data that anything better than antiplatelet therapy is better for this cohort.  Reviewed with the family thank you for the consultation

## 2021-09-04 DIAGNOSIS — I6350 Cerebral infarction due to unspecified occlusion or stenosis of unspecified cerebral artery: Secondary | ICD-10-CM | POA: Diagnosis not present

## 2021-09-04 DIAGNOSIS — R001 Bradycardia, unspecified: Secondary | ICD-10-CM | POA: Diagnosis not present

## 2021-09-04 DIAGNOSIS — I639 Cerebral infarction, unspecified: Secondary | ICD-10-CM | POA: Diagnosis not present

## 2021-09-05 DIAGNOSIS — Z79899 Other long term (current) drug therapy: Secondary | ICD-10-CM | POA: Diagnosis not present

## 2021-09-05 DIAGNOSIS — E559 Vitamin D deficiency, unspecified: Secondary | ICD-10-CM | POA: Diagnosis not present

## 2021-09-11 DIAGNOSIS — E559 Vitamin D deficiency, unspecified: Secondary | ICD-10-CM | POA: Diagnosis not present

## 2021-09-11 DIAGNOSIS — Z79899 Other long term (current) drug therapy: Secondary | ICD-10-CM | POA: Diagnosis not present

## 2021-09-17 DIAGNOSIS — N390 Urinary tract infection, site not specified: Secondary | ICD-10-CM | POA: Diagnosis not present

## 2021-09-20 DIAGNOSIS — Z85038 Personal history of other malignant neoplasm of large intestine: Secondary | ICD-10-CM | POA: Diagnosis not present

## 2021-09-20 DIAGNOSIS — Z9181 History of falling: Secondary | ICD-10-CM | POA: Diagnosis not present

## 2021-09-20 DIAGNOSIS — I69328 Other speech and language deficits following cerebral infarction: Secondary | ICD-10-CM | POA: Diagnosis not present

## 2021-09-20 DIAGNOSIS — E039 Hypothyroidism, unspecified: Secondary | ICD-10-CM | POA: Diagnosis not present

## 2021-09-20 DIAGNOSIS — Z7982 Long term (current) use of aspirin: Secondary | ICD-10-CM | POA: Diagnosis not present

## 2021-09-20 DIAGNOSIS — E785 Hyperlipidemia, unspecified: Secondary | ICD-10-CM | POA: Diagnosis not present

## 2021-09-20 DIAGNOSIS — I69322 Dysarthria following cerebral infarction: Secondary | ICD-10-CM | POA: Diagnosis not present

## 2021-09-20 DIAGNOSIS — M479 Spondylosis, unspecified: Secondary | ICD-10-CM | POA: Diagnosis not present

## 2021-09-20 DIAGNOSIS — I080 Rheumatic disorders of both mitral and aortic valves: Secondary | ICD-10-CM | POA: Diagnosis not present

## 2021-09-20 DIAGNOSIS — Z7902 Long term (current) use of antithrombotics/antiplatelets: Secondary | ICD-10-CM | POA: Diagnosis not present

## 2021-09-20 DIAGNOSIS — I10 Essential (primary) hypertension: Secondary | ICD-10-CM | POA: Diagnosis not present

## 2021-09-20 DIAGNOSIS — F32A Depression, unspecified: Secondary | ICD-10-CM | POA: Diagnosis not present

## 2021-09-20 DIAGNOSIS — I69392 Facial weakness following cerebral infarction: Secondary | ICD-10-CM | POA: Diagnosis not present

## 2021-09-20 DIAGNOSIS — Z87440 Personal history of urinary (tract) infections: Secondary | ICD-10-CM | POA: Diagnosis not present

## 2021-09-20 DIAGNOSIS — E1122 Type 2 diabetes mellitus with diabetic chronic kidney disease: Secondary | ICD-10-CM | POA: Diagnosis not present

## 2021-09-20 DIAGNOSIS — N183 Chronic kidney disease, stage 3 unspecified: Secondary | ICD-10-CM | POA: Diagnosis not present

## 2021-09-20 DIAGNOSIS — I69391 Dysphagia following cerebral infarction: Secondary | ICD-10-CM | POA: Diagnosis not present

## 2021-09-20 DIAGNOSIS — K219 Gastro-esophageal reflux disease without esophagitis: Secondary | ICD-10-CM | POA: Diagnosis not present

## 2021-09-23 DIAGNOSIS — E785 Hyperlipidemia, unspecified: Secondary | ICD-10-CM | POA: Diagnosis not present

## 2021-09-23 DIAGNOSIS — F32A Depression, unspecified: Secondary | ICD-10-CM | POA: Diagnosis not present

## 2021-09-23 DIAGNOSIS — N183 Chronic kidney disease, stage 3 unspecified: Secondary | ICD-10-CM | POA: Diagnosis not present

## 2021-09-23 DIAGNOSIS — I080 Rheumatic disorders of both mitral and aortic valves: Secondary | ICD-10-CM | POA: Diagnosis not present

## 2021-09-23 DIAGNOSIS — I10 Essential (primary) hypertension: Secondary | ICD-10-CM | POA: Diagnosis not present

## 2021-09-23 DIAGNOSIS — Z9181 History of falling: Secondary | ICD-10-CM | POA: Diagnosis not present

## 2021-09-23 DIAGNOSIS — Z87440 Personal history of urinary (tract) infections: Secondary | ICD-10-CM | POA: Diagnosis not present

## 2021-09-23 DIAGNOSIS — K219 Gastro-esophageal reflux disease without esophagitis: Secondary | ICD-10-CM | POA: Diagnosis not present

## 2021-09-23 DIAGNOSIS — I69392 Facial weakness following cerebral infarction: Secondary | ICD-10-CM | POA: Diagnosis not present

## 2021-09-23 DIAGNOSIS — Z7982 Long term (current) use of aspirin: Secondary | ICD-10-CM | POA: Diagnosis not present

## 2021-09-23 DIAGNOSIS — E1122 Type 2 diabetes mellitus with diabetic chronic kidney disease: Secondary | ICD-10-CM | POA: Diagnosis not present

## 2021-09-23 DIAGNOSIS — Z7902 Long term (current) use of antithrombotics/antiplatelets: Secondary | ICD-10-CM | POA: Diagnosis not present

## 2021-09-23 DIAGNOSIS — I69322 Dysarthria following cerebral infarction: Secondary | ICD-10-CM | POA: Diagnosis not present

## 2021-09-23 DIAGNOSIS — M479 Spondylosis, unspecified: Secondary | ICD-10-CM | POA: Diagnosis not present

## 2021-09-23 DIAGNOSIS — I69328 Other speech and language deficits following cerebral infarction: Secondary | ICD-10-CM | POA: Diagnosis not present

## 2021-09-23 DIAGNOSIS — I69391 Dysphagia following cerebral infarction: Secondary | ICD-10-CM | POA: Diagnosis not present

## 2021-09-23 DIAGNOSIS — Z85038 Personal history of other malignant neoplasm of large intestine: Secondary | ICD-10-CM | POA: Diagnosis not present

## 2021-09-23 DIAGNOSIS — E039 Hypothyroidism, unspecified: Secondary | ICD-10-CM | POA: Diagnosis not present

## 2021-09-24 ENCOUNTER — Telehealth: Payer: Self-pay | Admitting: Internal Medicine

## 2021-09-24 DIAGNOSIS — I639 Cerebral infarction, unspecified: Secondary | ICD-10-CM

## 2021-09-24 DIAGNOSIS — I4891 Unspecified atrial fibrillation: Secondary | ICD-10-CM

## 2021-09-24 NOTE — Telephone Encounter (Signed)
Spoke with Quillian Quince form [employer] who reports that starting on 10/07 at 9:38am the patient was in Atrial fibrillation/Atrial flutter for 48 hours. She reports that the tech misclassified the results so we were not notified. They will be faxing over the report.  Spoke with the patient's husband who reports that the patient is doing good. She is currently napping. He reports that she did not have any symptoms while she was wearing the monitor. Advised that we will call back once we get the final report faxed over to [place] and it is reviewed by Dr. Caryl Comes. Patient's husband verbalized understanding.

## 2021-09-24 NOTE — Telephone Encounter (Signed)
Daniela from [facility] calling with additional findings on the patient's heart monitor.

## 2021-09-24 NOTE — Addendum Note (Signed)
Encounter addended by: Benita Stabile on: 09/24/2021 3:43 PM  Actions taken: Imaging Exam ended

## 2021-09-25 DIAGNOSIS — I10 Essential (primary) hypertension: Secondary | ICD-10-CM | POA: Diagnosis not present

## 2021-09-25 DIAGNOSIS — I69322 Dysarthria following cerebral infarction: Secondary | ICD-10-CM | POA: Diagnosis not present

## 2021-09-25 DIAGNOSIS — E785 Hyperlipidemia, unspecified: Secondary | ICD-10-CM | POA: Diagnosis not present

## 2021-09-25 DIAGNOSIS — I69328 Other speech and language deficits following cerebral infarction: Secondary | ICD-10-CM | POA: Diagnosis not present

## 2021-09-25 DIAGNOSIS — Z9181 History of falling: Secondary | ICD-10-CM | POA: Diagnosis not present

## 2021-09-25 DIAGNOSIS — M479 Spondylosis, unspecified: Secondary | ICD-10-CM | POA: Diagnosis not present

## 2021-09-25 DIAGNOSIS — I69391 Dysphagia following cerebral infarction: Secondary | ICD-10-CM | POA: Diagnosis not present

## 2021-09-25 DIAGNOSIS — F32A Depression, unspecified: Secondary | ICD-10-CM | POA: Diagnosis not present

## 2021-09-25 DIAGNOSIS — Z7982 Long term (current) use of aspirin: Secondary | ICD-10-CM | POA: Diagnosis not present

## 2021-09-25 DIAGNOSIS — I69392 Facial weakness following cerebral infarction: Secondary | ICD-10-CM | POA: Diagnosis not present

## 2021-09-25 DIAGNOSIS — N183 Chronic kidney disease, stage 3 unspecified: Secondary | ICD-10-CM | POA: Diagnosis not present

## 2021-09-25 DIAGNOSIS — Z85038 Personal history of other malignant neoplasm of large intestine: Secondary | ICD-10-CM | POA: Diagnosis not present

## 2021-09-25 DIAGNOSIS — I080 Rheumatic disorders of both mitral and aortic valves: Secondary | ICD-10-CM | POA: Diagnosis not present

## 2021-09-25 DIAGNOSIS — Z7902 Long term (current) use of antithrombotics/antiplatelets: Secondary | ICD-10-CM | POA: Diagnosis not present

## 2021-09-25 DIAGNOSIS — K219 Gastro-esophageal reflux disease without esophagitis: Secondary | ICD-10-CM | POA: Diagnosis not present

## 2021-09-25 DIAGNOSIS — E039 Hypothyroidism, unspecified: Secondary | ICD-10-CM | POA: Diagnosis not present

## 2021-09-25 DIAGNOSIS — Z87440 Personal history of urinary (tract) infections: Secondary | ICD-10-CM | POA: Diagnosis not present

## 2021-09-25 DIAGNOSIS — E1122 Type 2 diabetes mellitus with diabetic chronic kidney disease: Secondary | ICD-10-CM | POA: Diagnosis not present

## 2021-09-25 NOTE — Telephone Encounter (Signed)
Anne [name] from [facility] was calling back to follow up on the call she placed to the office yesterday

## 2021-09-26 NOTE — Telephone Encounter (Signed)
Reached out to patient husband schedule appointment 11/7 at 3pm with Clint Fenton

## 2021-09-29 NOTE — Telephone Encounter (Signed)
Spoke with Daniella at [employer].  She was calling about her previous message.  She said the nurse was going to speak with the doctor and call back but she didn't receive a call.  States she was calling to see if the doctor determined whether or not there was any harm done to the pt after the incorrect reading of AF on monitor.  Advised I will send to Dr. Olin Pia nurse and have her follow up.    Daniella's number is [phone]

## 2021-09-29 NOTE — Telephone Encounter (Signed)
   Anne [name] with [employer] calling back to follow up

## 2021-09-30 ENCOUNTER — Inpatient Hospital Stay: Payer: Self-pay | Admitting: Adult Health

## 2021-09-30 NOTE — Telephone Encounter (Signed)
Spoke with Hollie Salk, [employer]  who is following up re: Afib reported on monitor 10/07.  Advised pt was contacted 10/26 and no symptoms were reported while wearing the heart monitor.  Pt is scheduled for follow up with our Afib clinic.  [place] thanked RN for taking the call re: monitor report.

## 2021-09-30 NOTE — Telephone Encounter (Signed)
Daniella returning call.

## 2021-09-30 NOTE — Telephone Encounter (Signed)
Attempted phone call to Anne [name] at number provided and left voicemail message to contact RN at [phone].

## 2021-10-02 DIAGNOSIS — I10 Essential (primary) hypertension: Secondary | ICD-10-CM | POA: Diagnosis not present

## 2021-10-02 DIAGNOSIS — Z87440 Personal history of urinary (tract) infections: Secondary | ICD-10-CM | POA: Diagnosis not present

## 2021-10-02 DIAGNOSIS — E039 Hypothyroidism, unspecified: Secondary | ICD-10-CM | POA: Diagnosis not present

## 2021-10-02 DIAGNOSIS — Z8673 Personal history of transient ischemic attack (TIA), and cerebral infarction without residual deficits: Secondary | ICD-10-CM | POA: Diagnosis not present

## 2021-10-02 DIAGNOSIS — Z09 Encounter for follow-up examination after completed treatment for conditions other than malignant neoplasm: Secondary | ICD-10-CM | POA: Diagnosis not present

## 2021-10-02 DIAGNOSIS — E1122 Type 2 diabetes mellitus with diabetic chronic kidney disease: Secondary | ICD-10-CM | POA: Diagnosis not present

## 2021-10-02 NOTE — Addendum Note (Signed)
Encounter addended by: Markus Daft A on: 10/02/2021 2:17 PM  Actions taken: Imaging Exam ended

## 2021-10-03 DIAGNOSIS — M479 Spondylosis, unspecified: Secondary | ICD-10-CM | POA: Diagnosis not present

## 2021-10-03 DIAGNOSIS — Z87440 Personal history of urinary (tract) infections: Secondary | ICD-10-CM | POA: Diagnosis not present

## 2021-10-03 DIAGNOSIS — E785 Hyperlipidemia, unspecified: Secondary | ICD-10-CM | POA: Diagnosis not present

## 2021-10-03 DIAGNOSIS — Z85038 Personal history of other malignant neoplasm of large intestine: Secondary | ICD-10-CM | POA: Diagnosis not present

## 2021-10-03 DIAGNOSIS — I69328 Other speech and language deficits following cerebral infarction: Secondary | ICD-10-CM | POA: Diagnosis not present

## 2021-10-03 DIAGNOSIS — I69391 Dysphagia following cerebral infarction: Secondary | ICD-10-CM | POA: Diagnosis not present

## 2021-10-03 DIAGNOSIS — E039 Hypothyroidism, unspecified: Secondary | ICD-10-CM | POA: Diagnosis not present

## 2021-10-03 DIAGNOSIS — E1122 Type 2 diabetes mellitus with diabetic chronic kidney disease: Secondary | ICD-10-CM | POA: Diagnosis not present

## 2021-10-03 DIAGNOSIS — I10 Essential (primary) hypertension: Secondary | ICD-10-CM | POA: Diagnosis not present

## 2021-10-03 DIAGNOSIS — I69392 Facial weakness following cerebral infarction: Secondary | ICD-10-CM | POA: Diagnosis not present

## 2021-10-03 DIAGNOSIS — N183 Chronic kidney disease, stage 3 unspecified: Secondary | ICD-10-CM | POA: Diagnosis not present

## 2021-10-03 DIAGNOSIS — Z7982 Long term (current) use of aspirin: Secondary | ICD-10-CM | POA: Diagnosis not present

## 2021-10-03 DIAGNOSIS — K219 Gastro-esophageal reflux disease without esophagitis: Secondary | ICD-10-CM | POA: Diagnosis not present

## 2021-10-03 DIAGNOSIS — Z9181 History of falling: Secondary | ICD-10-CM | POA: Diagnosis not present

## 2021-10-03 DIAGNOSIS — I69322 Dysarthria following cerebral infarction: Secondary | ICD-10-CM | POA: Diagnosis not present

## 2021-10-03 DIAGNOSIS — I080 Rheumatic disorders of both mitral and aortic valves: Secondary | ICD-10-CM | POA: Diagnosis not present

## 2021-10-03 DIAGNOSIS — F32A Depression, unspecified: Secondary | ICD-10-CM | POA: Diagnosis not present

## 2021-10-03 DIAGNOSIS — Z7902 Long term (current) use of antithrombotics/antiplatelets: Secondary | ICD-10-CM | POA: Diagnosis not present

## 2021-10-06 ENCOUNTER — Encounter (HOSPITAL_COMMUNITY): Payer: Self-pay | Admitting: Physician Assistant

## 2021-10-06 ENCOUNTER — Other Ambulatory Visit: Payer: Self-pay

## 2021-10-06 ENCOUNTER — Ambulatory Visit (HOSPITAL_COMMUNITY)
Admission: RE | Admit: 2021-10-06 | Discharge: 2021-10-06 | Disposition: A | Discharge status: Home / Self Care | Payer: Medicare Other | Source: Ambulatory Visit | Attending: Physician Assistant | Admitting: Physician Assistant

## 2021-10-06 VITALS — BP 108/80 | HR 50 | Ht 64.0 in | Wt 133.8 lb

## 2021-10-06 DIAGNOSIS — E119 Type 2 diabetes mellitus without complications: Secondary | ICD-10-CM | POA: Insufficient documentation

## 2021-10-06 DIAGNOSIS — D6869 Other thrombophilia: Secondary | ICD-10-CM | POA: Insufficient documentation

## 2021-10-06 DIAGNOSIS — I10 Essential (primary) hypertension: Secondary | ICD-10-CM | POA: Diagnosis not present

## 2021-10-06 DIAGNOSIS — I495 Sick sinus syndrome: Secondary | ICD-10-CM | POA: Diagnosis not present

## 2021-10-06 DIAGNOSIS — Z8673 Personal history of transient ischemic attack (TIA), and cerebral infarction without residual deficits: Secondary | ICD-10-CM | POA: Insufficient documentation

## 2021-10-06 DIAGNOSIS — I480 Paroxysmal atrial fibrillation: Secondary | ICD-10-CM

## 2021-10-06 DIAGNOSIS — Z85038 Personal history of other malignant neoplasm of large intestine: Secondary | ICD-10-CM | POA: Diagnosis present

## 2021-10-06 MED ORDER — APIXABAN 5 MG PO TABS: 5.0000 mg | ORAL_TABLET | Freq: Two times a day (BID) | ORAL | 3 refills | Status: DC

## 2021-10-06 NOTE — Progress Notes (Signed)
Primary Care Physician: Thressa Sheller, MD (Inactive) Primary Cardiologist: none Primary Electrophysiologist: Dr Caryl Comes Referring Physician: Dr Arlina Robes Anne is a 87 y.o. [name] with a history of sinus node dysfunction, DM, HTN, colon cancer s/p hemicolectomy 2015, prior CVA, hypothyroidism, and atrial fibrillation who presents for follow up in the [facility].  The patient was initially diagnosed with atrial fibrillation on a cardiac monitor after a cryptogenic stroke. She presented to the hospital 08/28/21 with facial droop and slurred speech and was diagnosed with an acute CVA. Patient has a CHADS2VASC score of 7. The cardiac monitor showed 17% afib burden. She was unaware of her arrhythmia.   Today, she denies symptoms of palpitations, chest pain, shortness of breath, orthopnea, PND, lower extremity edema, dizziness, presyncope, syncope, snoring, daytime somnolence, bleeding, or neurologic sequela. The patient is tolerating medications without difficulties and is otherwise without complaint today.    Atrial Fibrillation Risk Factors:  she does not have symptoms or diagnosis of sleep apnea. she does not have a history of rheumatic fever.   she has a BMI of Body mass index is 22.97 kg/m.Marland Kitchen Filed Weights   10/06/21 1437  Weight: 60.7 kg    Family History  Problem Relation Age of Onset   Stroke Mother    Cancer Sister        breast   Cancer Brother        lung and thyroid     Atrial Fibrillation Management history:  Previous antiarrhythmic drugs: none Previous cardioversions: none Previous ablations: none CHADS2VASC score: 7 Anticoagulation history: none   Past Medical History:  Diagnosis Date   Abdominal distension    Abdominal pain    Anxiety    facial twitch and click , twitch lt shoulder  due to anxiety   Arthritis    right first finger   Chronic kidney disease    hx uti 1 mo ago was med tx    Constipation     Depression    GERD (gastroesophageal reflux disease)    Hypothyroidism    Itching    at times, area varies   No pertinent past medical history    OCC PAINS TO ABDOMEN AND CHEST , DR BYERLY PLACED PATIENT ON MED FOR GERD FOR 2 WEEKS   Thyroid disease    Past Surgical History:  Procedure Laterality Date   ABDOMINAL HYSTERECTOMY     partial - patient does not remember date   APPENDECTOMY  1948   bladder tack     Patient does not remember date   CHOLECYSTECTOMY  12/30/2011   Procedure: LAPAROSCOPIC CHOLECYSTECTOMY WITH INTRAOPERATIVE CHOLANGIOGRAM;  Surgeon: Stark Klein, MD;  Location: [facility];  Service: General;  Laterality: N/A;   colonscopy  01-12-2014   LAPAROSCOPIC RIGHT HEMI COLECTOMY Right 12/19/2013   Procedure: LAPAROSCOPIC RIGHT HEMI COLECTOMY;  Surgeon: Shann Medal, MD;  Location: [facility];  Service: General;  Laterality: Right;   NOSE SURGERY     1970   vericose vein removal     legs. does not remember date of procedure    Current Outpatient Medications  Medication Sig Dispense Refill   ALPRAZolam (XANAX) 0.25 MG tablet Take 0.125 mg by mouth daily.     amLODipine (NORVASC) 5 MG tablet Take 1 tablet (5 mg total) by mouth daily. 60 tablet 0   apixaban (ELIQUIS) 5 MG TABS tablet Take 1 tablet (5 mg total) by mouth 2 (two) times daily. 60 tablet 3  Cholecalciferol (VITAMIN D3) 1000 UNITS CAPS Take 1,000 Units by mouth daily.      ELDERBERRY PO Take 5 mLs by mouth. "boost immune system"     levothyroxine (SYNTHROID) 75 MCG tablet Take 75 mcg by mouth daily before breakfast.     polyethylene glycol (MIRALAX / GLYCOLAX) packet Take 17 g by mouth daily as needed for mild constipation (constipation).     simvastatin (ZOCOR) 20 MG tablet Take 20 mg by mouth daily.     traMADol (ULTRAM) 50 MG tablet Take 50 mg by mouth every 6 (six) hours as needed for moderate pain.     No current facility-administered medications for this encounter.    No Known Allergies  Social History    Socioeconomic History   Marital status: Married    Spouse name: Not on file   Number of children: 3   Years of education: Not on file   Highest education level: Not on file  Occupational History   Not on file  Tobacco Use   Smoking status: Never   Smokeless tobacco: Never  Substance and Sexual Activity   Alcohol use: No   Drug use: No   Sexual activity: Not on file  Other Topics Concern   Not on file  Social History Narrative   Not on file   Social Determinants of Health   Financial Resource Strain: Not on file  Food Insecurity: Not on file  Transportation Needs: Not on file  Physical Activity: Not on file  Stress: Not on file  Social Connections: Not on file  Intimate Partner Violence: Not on file     ROS- All systems are reviewed and negative except as per the HPI above.  Physical Exam: Vitals:   10/06/21 1437  BP: 108/80  Pulse: (!) 50  Weight: 60.7 kg  Height: 5\' 4"  (1.626 m)    GEN- The patient is a well appearing eldelry [name], alert and oriented x 3 today.   Head- normocephalic, atraumatic Eyes-  Sclera clear, conjunctiva pink Ears- hearing intact Oropharynx- clear Neck- supple  Lungs- Clear to ausculation bilaterally, normal work of breathing Heart- Regular rate and rhythm, bradycardia, no murmurs, rubs or gallops  GI- soft, NT, ND, + BS Extremities- no clubbing, cyanosis, or edema MS- no significant deformity or atrophy Skin- no rash or lesion Psych- euthymic mood, full affect Neuro- strength and sensation are intact  Wt Readings from Last 3 Encounters:  10/06/21 60.7 kg  08/28/21 62.9 kg  06/16/17 65.3 kg    EKG today demonstrates  Junctional rhythm with retrograde P waves Vent. rate 50 BPM PR interval * ms QRS duration 76 ms QT/QTcB 512/466 ms  Echo 08/29/21 demonstrated   1. Left ventricular ejection fraction, by estimation, is 60 to 65%. Left  ventricular ejection fraction by 3D volume is 62 %. The left ventricle has normal  function. The left ventricle has no regional wall motion  abnormalities. Left ventricular diastolic  parameters are consistent with Grade I diastolic dysfunction (impaired relaxation).   2. Right ventricular systolic function is normal. The right ventricular  size is normal. There is normal pulmonary artery systolic pressure. The estimated right ventricular systolic pressure is 80.9 mmHg.   3. Abnormal LA resevoir strain at 11.1%.   4. The mitral valve is abnormal. Trivial mitral valve regurgitation.   5. The aortic valve is tricuspid. Aortic valve regurgitation is mild to  moderate.   6. The inferior vena cava is normal in size with greater than 50%  respiratory variability, suggesting right atrial pressure of 3 mmHg.   Epic records are reviewed at length today  CHA2DS2-VASc Score = 7  The patient's score is based upon: CHF History: 0 HTN History: 1 Diabetes History: 1 Stroke History: 2 Vascular Disease History: 0 Age Score: 2 Gender Score: 1      ASSESSMENT AND PLAN: 1. Paroxysmal Atrial Fibrillation (ICD10:  I48.0) The patient's CHA2DS2-VASc score is 7, indicating a 11.2% annual risk of stroke.   General education about afib provided and questions answered. We also discussed her stroke risk and the risks and benefits of anticoagulation. Start Eliquis 5 mg BID (weight 60.7 kg, Cr 0.98, will need to watch weight closely) Stop ASA Will not start AV nodal agent given h/o sinus node dysfunction.   2. Secondary Hypercoagulable State (ICD10:  D68.69) The patient is at significant risk for stroke/thromboembolism based upon her CHA2DS2-VASc Score of 7.  Start Apixaban (Eliquis).   3. HTN Stable, no changes today.  4. Sinus node dysfunction No plans for PPM presently.    Follow up with Oda Kilts PA as scheduled.    [facility] [address] [place], [place] [address] [phone] 10/06/2021 4:28 PM

## 2021-10-06 NOTE — Patient Instructions (Signed)
Stop aspirin  Start Eliquis 5 mg twice a day.

## 2021-10-10 DIAGNOSIS — I080 Rheumatic disorders of both mitral and aortic valves: Secondary | ICD-10-CM | POA: Diagnosis not present

## 2021-10-10 DIAGNOSIS — Z9181 History of falling: Secondary | ICD-10-CM | POA: Diagnosis not present

## 2021-10-10 DIAGNOSIS — Z85038 Personal history of other malignant neoplasm of large intestine: Secondary | ICD-10-CM | POA: Diagnosis not present

## 2021-10-10 DIAGNOSIS — E785 Hyperlipidemia, unspecified: Secondary | ICD-10-CM | POA: Diagnosis not present

## 2021-10-10 DIAGNOSIS — F32A Depression, unspecified: Secondary | ICD-10-CM | POA: Diagnosis not present

## 2021-10-10 DIAGNOSIS — I69328 Other speech and language deficits following cerebral infarction: Secondary | ICD-10-CM | POA: Diagnosis not present

## 2021-10-10 DIAGNOSIS — I69392 Facial weakness following cerebral infarction: Secondary | ICD-10-CM | POA: Diagnosis not present

## 2021-10-10 DIAGNOSIS — N183 Chronic kidney disease, stage 3 unspecified: Secondary | ICD-10-CM | POA: Diagnosis not present

## 2021-10-10 DIAGNOSIS — E1122 Type 2 diabetes mellitus with diabetic chronic kidney disease: Secondary | ICD-10-CM | POA: Diagnosis not present

## 2021-10-10 DIAGNOSIS — I10 Essential (primary) hypertension: Secondary | ICD-10-CM | POA: Diagnosis not present

## 2021-10-10 DIAGNOSIS — Z7902 Long term (current) use of antithrombotics/antiplatelets: Secondary | ICD-10-CM | POA: Diagnosis not present

## 2021-10-10 DIAGNOSIS — E039 Hypothyroidism, unspecified: Secondary | ICD-10-CM | POA: Diagnosis not present

## 2021-10-10 DIAGNOSIS — I69391 Dysphagia following cerebral infarction: Secondary | ICD-10-CM | POA: Diagnosis not present

## 2021-10-10 DIAGNOSIS — Z7982 Long term (current) use of aspirin: Secondary | ICD-10-CM | POA: Diagnosis not present

## 2021-10-10 DIAGNOSIS — M479 Spondylosis, unspecified: Secondary | ICD-10-CM | POA: Diagnosis not present

## 2021-10-10 DIAGNOSIS — Z87440 Personal history of urinary (tract) infections: Secondary | ICD-10-CM | POA: Diagnosis not present

## 2021-10-10 DIAGNOSIS — I69322 Dysarthria following cerebral infarction: Secondary | ICD-10-CM | POA: Diagnosis not present

## 2021-10-10 DIAGNOSIS — K219 Gastro-esophageal reflux disease without esophagitis: Secondary | ICD-10-CM | POA: Diagnosis not present

## 2021-10-17 DIAGNOSIS — Z85038 Personal history of other malignant neoplasm of large intestine: Secondary | ICD-10-CM | POA: Diagnosis not present

## 2021-10-17 DIAGNOSIS — E1122 Type 2 diabetes mellitus with diabetic chronic kidney disease: Secondary | ICD-10-CM | POA: Diagnosis not present

## 2021-10-17 DIAGNOSIS — Z7982 Long term (current) use of aspirin: Secondary | ICD-10-CM | POA: Diagnosis not present

## 2021-10-17 DIAGNOSIS — K219 Gastro-esophageal reflux disease without esophagitis: Secondary | ICD-10-CM | POA: Diagnosis not present

## 2021-10-17 DIAGNOSIS — Z7902 Long term (current) use of antithrombotics/antiplatelets: Secondary | ICD-10-CM | POA: Diagnosis not present

## 2021-10-17 DIAGNOSIS — E785 Hyperlipidemia, unspecified: Secondary | ICD-10-CM | POA: Diagnosis not present

## 2021-10-17 DIAGNOSIS — I69328 Other speech and language deficits following cerebral infarction: Secondary | ICD-10-CM | POA: Diagnosis not present

## 2021-10-17 DIAGNOSIS — E039 Hypothyroidism, unspecified: Secondary | ICD-10-CM | POA: Diagnosis not present

## 2021-10-17 DIAGNOSIS — I69392 Facial weakness following cerebral infarction: Secondary | ICD-10-CM | POA: Diagnosis not present

## 2021-10-17 DIAGNOSIS — I080 Rheumatic disorders of both mitral and aortic valves: Secondary | ICD-10-CM | POA: Diagnosis not present

## 2021-10-17 DIAGNOSIS — F32A Depression, unspecified: Secondary | ICD-10-CM | POA: Diagnosis not present

## 2021-10-17 DIAGNOSIS — I10 Essential (primary) hypertension: Secondary | ICD-10-CM | POA: Diagnosis not present

## 2021-10-17 DIAGNOSIS — I69391 Dysphagia following cerebral infarction: Secondary | ICD-10-CM | POA: Diagnosis not present

## 2021-10-17 DIAGNOSIS — Z9181 History of falling: Secondary | ICD-10-CM | POA: Diagnosis not present

## 2021-10-17 DIAGNOSIS — Z87440 Personal history of urinary (tract) infections: Secondary | ICD-10-CM | POA: Diagnosis not present

## 2021-10-17 DIAGNOSIS — M479 Spondylosis, unspecified: Secondary | ICD-10-CM | POA: Diagnosis not present

## 2021-10-17 DIAGNOSIS — I69322 Dysarthria following cerebral infarction: Secondary | ICD-10-CM | POA: Diagnosis not present

## 2021-10-17 DIAGNOSIS — N183 Chronic kidney disease, stage 3 unspecified: Secondary | ICD-10-CM | POA: Diagnosis not present

## 2021-10-19 NOTE — Progress Notes (Signed)
PCP:  Thressa Sheller, MD (Inactive) Primary Cardiologist: None Electrophysiologist: Virl Axe, MD   Anne [name] is a 87 y.o. female seen today for Virl Axe, MD for routine electrophysiology followup.  Since last being seen in our clinic the patient reports doing OK. She was diagnosed with AF on the monitor she wore, and has previously seen AF clinic and been started on Eliquis. She notices her HR "speed up" from time to time. Usually when she is stressed or upset. Otherwise,  she denies chest pain, dyspnea, PND, orthopnea, nausea, vomiting, dizziness, syncope, edema, weight gain, or early satiety.  Past Medical History:  Diagnosis Date   Abdominal distension    Abdominal pain    Anxiety    facial twitch and click , twitch lt shoulder  due to anxiety   Arthritis    right first finger   Chronic kidney disease    hx uti 1 mo ago was med tx    Constipation    Depression    GERD (gastroesophageal reflux disease)    Hypothyroidism    Itching    at times, area varies   No pertinent past medical history    OCC PAINS TO ABDOMEN AND CHEST , DR BYERLY PLACED PATIENT ON MED FOR GERD FOR 2 WEEKS   Thyroid disease    Past Surgical History:  Procedure Laterality Date   ABDOMINAL HYSTERECTOMY     partial - patient does not remember date   APPENDECTOMY  1948   bladder tack     Patient does not remember date   CHOLECYSTECTOMY  12/30/2011   Procedure: LAPAROSCOPIC CHOLECYSTECTOMY WITH INTRAOPERATIVE CHOLANGIOGRAM;  Surgeon: Stark Klein, MD;  Location: [facility];  Service: General;  Laterality: N/A;   colonscopy  01-12-2014   LAPAROSCOPIC RIGHT HEMI COLECTOMY Right 12/19/2013   Procedure: LAPAROSCOPIC RIGHT HEMI COLECTOMY;  Surgeon: Shann Medal, MD;  Location: [facility];  Service: General;  Laterality: Right;   NOSE SURGERY     1970   vericose vein removal     legs. does not remember date of procedure    Current Outpatient Medications  Medication Sig Dispense Refill    ALPRAZolam (XANAX) 0.25 MG tablet Take 0.125 mg by mouth daily.     amLODipine (NORVASC) 5 MG tablet Take 1 tablet (5 mg total) by mouth daily. 60 tablet 0   apixaban (ELIQUIS) 5 MG TABS tablet Take 1 tablet (5 mg total) by mouth 2 (two) times daily. 60 tablet 3   Cholecalciferol (VITAMIN D3) 1000 UNITS CAPS Take 1,000 Units by mouth daily.      ELDERBERRY PO Take 5 mLs by mouth. "boost immune system"     levothyroxine (SYNTHROID) 75 MCG tablet Take 75 mcg by mouth daily before breakfast.     polyethylene glycol (MIRALAX / GLYCOLAX) packet Take 17 g by mouth daily as needed for mild constipation (constipation).     simvastatin (ZOCOR) 20 MG tablet Take 20 mg by mouth daily.     traMADol (ULTRAM) 50 MG tablet Take 50 mg by mouth every 6 (six) hours as needed for moderate pain.     No current facility-administered medications for this visit.    No Known Allergies  Social History   Socioeconomic History   Marital status: Married    Spouse name: Not on file   Number of children: 3   Years of education: Not on file   Highest education level: Not on file  Occupational History   Not on file  Tobacco Use   Smoking status: Never   Smokeless tobacco: Never  Substance and Sexual Activity   Alcohol use: No   Drug use: No   Sexual activity: Not on file  Other Topics Concern   Not on file  Social History Narrative   Not on file   Social Determinants of Health   Financial Resource Strain: Not on file  Food Insecurity: Not on file  Transportation Needs: Not on file  Physical Activity: Not on file  Stress: Not on file  Social Connections: Not on file  Intimate Partner Violence: Not on file     Review of Systems: All other systems reviewed and are otherwise negative except as noted above.  Physical Exam: Vitals:   10/20/21 1202  BP: 120/68  Pulse: 69  SpO2: 98%  Weight: 136 lb (61.7 kg)  Height: 5\' 4"  (1.626 m)    GEN- The patient is elderly appearing, alert and oriented x  3 today.   HEENT: normocephalic, atraumatic; sclera clear, conjunctiva pink; hearing intact; oropharynx clear; neck supple, no JVP Lymph- no cervical lymphadenopathy Lungs- Clear to ausculation bilaterally, normal work of breathing.  No wheezes, rales, rhonchi Heart- Regular rate and rhythm, no murmurs, rubs or gallops, PMI not laterally displaced GI- soft, non-tender, non-distended, bowel sounds present, no hepatosplenomegaly Extremities- no clubbing, cyanosis, or edema; DP/PT/radial pulses 2+ bilaterally MS- no significant deformity or atrophy Skin- warm and dry, no rash or lesion Psych- euthymic mood, full affect Neuro- strength and sensation are intact  EKG is not ordered.   Additional studies reviewed include: Previous EP office notes.   Assessment and Plan:  1. Paroxysmal Atrial Fibrillation (ICD10:  I48.0) The patient's CHA2DS2-VASc score is 7, indicating a 11.2% annual risk of stroke.   Continue eliquis 5 mg BID for now. Labs today. Follow weight closely.  Will not start AV nodal agent given h/o sinus node dysfunction.  Discussed at length with pt and husband today.    2. Secondary Hypercoagulable State (ICD10:  D68.69) The patient is at significant risk for stroke/thromboembolism based upon her CHA2DS2-VASc Score of 7.  Recently started on eliquis. Labs today.    3. HTN Stable on current regimen    4. Sinus node dysfunction No plans for PPM presently.    Follow up with Dr. Caryl Comes in 6 months, sooner with issues.    [name], PA-C  10/20/21 12:10 PM

## 2021-10-20 ENCOUNTER — Other Ambulatory Visit: Payer: Self-pay

## 2021-10-20 ENCOUNTER — Ambulatory Visit: Payer: Medicare Other | Admitting: Student

## 2021-10-20 ENCOUNTER — Encounter: Payer: Self-pay | Admitting: Student

## 2021-10-20 VITALS — BP 120/68 | HR 69 | Ht 64.0 in | Wt 136.0 lb

## 2021-10-20 DIAGNOSIS — I480 Paroxysmal atrial fibrillation: Secondary | ICD-10-CM | POA: Diagnosis not present

## 2021-10-20 DIAGNOSIS — D6869 Other thrombophilia: Secondary | ICD-10-CM | POA: Diagnosis not present

## 2021-10-20 DIAGNOSIS — R001 Bradycardia, unspecified: Secondary | ICD-10-CM

## 2021-10-20 DIAGNOSIS — I639 Cerebral infarction, unspecified: Secondary | ICD-10-CM

## 2021-10-20 NOTE — Patient Instructions (Addendum)
Medication Instructions:  Your physician recommends that you continue on your current medications as directed. Please refer to the Current Medication list given to you today.  *If you need a refill on your cardiac medications before your next appointment, please call your pharmacy*   Lab Work: TODAY: BMET, CBC   If you have labs (blood work) drawn today and your tests are completely normal, you will receive your results only by: [place] (if you have MyChart) OR A paper copy in the mail If you have any lab test that is abnormal or we need to change your treatment, we will call you to review the results.   Follow-Up: At [facility], you and your health needs are our priority.  As part of our continuing mission to provide you with exceptional heart care, we have created designated Provider Care Teams.  These Care Teams include your primary Cardiologist (physician) and Advanced Practice Providers (APPs -  Physician Assistants and Nurse Practitioners) who all work together to provide you with the care you need, when you need it.  We recommend signing up for the patient portal called "MyChart".  Sign up information is provided on this After Visit Summary.  MyChart is used to connect with patients for Virtual Visits (Telemedicine).  Patients are able to view lab/test results, encounter notes, upcoming appointments, etc.  Non-urgent messages can be sent to your provider as well.   To learn more about what you can do with MyChart, go to [email or web address].    Your next appointment:   6 month(s)  The format for your next appointment:   In Person  Provider:   You may see Virl Axe, MD or one of the following Advanced Practice Providers on your designated Care Team:   Tommye Standard, [place] "[facility]" [place], [place]

## 2021-10-21 LAB — CBC
Hematocrit: 42.3 % (ref 34.0–46.6)
Hemoglobin: 14.7 g/dL (ref 11.1–15.9)
MCH: 31.5 pg (ref 26.6–33.0)
MCHC: 34.8 g/dL (ref 31.5–35.7)
MCV: 91 fL (ref 79–97)
Platelets: 173 10*3/uL (ref 150–450)
RBC: 4.67 x10E6/uL (ref 3.77–5.28)
RDW: 12.9 % (ref 11.7–15.4)
WBC: 5.7 10*3/uL (ref 3.4–10.8)

## 2021-10-21 LAB — BASIC METABOLIC PANEL
BUN/Creatinine Ratio: 18 (ref 12–28)
BUN: 16 mg/dL (ref 10–36)
CO2: 23 mmol/L (ref 20–29)
Calcium: 9.2 mg/dL (ref 8.7–10.3)
Chloride: 105 mmol/L (ref 96–106)
Creatinine, Ser: 0.9 mg/dL (ref 0.57–1.00)
Glucose: 111 mg/dL — ABNORMAL HIGH (ref 70–99)
Potassium: 4 mmol/L (ref 3.5–5.2)
Sodium: 140 mmol/L (ref 134–144)
eGFR: 61 mL/min/{1.73_m2} (ref >59)

## 2021-10-24 DIAGNOSIS — E785 Hyperlipidemia, unspecified: Secondary | ICD-10-CM | POA: Diagnosis not present

## 2021-10-24 DIAGNOSIS — Z9181 History of falling: Secondary | ICD-10-CM | POA: Diagnosis not present

## 2021-10-24 DIAGNOSIS — F32A Depression, unspecified: Secondary | ICD-10-CM | POA: Diagnosis not present

## 2021-10-24 DIAGNOSIS — E1122 Type 2 diabetes mellitus with diabetic chronic kidney disease: Secondary | ICD-10-CM | POA: Diagnosis not present

## 2021-10-24 DIAGNOSIS — I69392 Facial weakness following cerebral infarction: Secondary | ICD-10-CM | POA: Diagnosis not present

## 2021-10-24 DIAGNOSIS — I080 Rheumatic disorders of both mitral and aortic valves: Secondary | ICD-10-CM | POA: Diagnosis not present

## 2021-10-24 DIAGNOSIS — Z85038 Personal history of other malignant neoplasm of large intestine: Secondary | ICD-10-CM | POA: Diagnosis not present

## 2021-10-24 DIAGNOSIS — I69391 Dysphagia following cerebral infarction: Secondary | ICD-10-CM | POA: Diagnosis not present

## 2021-10-24 DIAGNOSIS — I10 Essential (primary) hypertension: Secondary | ICD-10-CM | POA: Diagnosis not present

## 2021-10-24 DIAGNOSIS — Z7902 Long term (current) use of antithrombotics/antiplatelets: Secondary | ICD-10-CM | POA: Diagnosis not present

## 2021-10-24 DIAGNOSIS — I69328 Other speech and language deficits following cerebral infarction: Secondary | ICD-10-CM | POA: Diagnosis not present

## 2021-10-24 DIAGNOSIS — N183 Chronic kidney disease, stage 3 unspecified: Secondary | ICD-10-CM | POA: Diagnosis not present

## 2021-10-24 DIAGNOSIS — M479 Spondylosis, unspecified: Secondary | ICD-10-CM | POA: Diagnosis not present

## 2021-10-24 DIAGNOSIS — E039 Hypothyroidism, unspecified: Secondary | ICD-10-CM | POA: Diagnosis not present

## 2021-10-24 DIAGNOSIS — Z87440 Personal history of urinary (tract) infections: Secondary | ICD-10-CM | POA: Diagnosis not present

## 2021-10-24 DIAGNOSIS — I69322 Dysarthria following cerebral infarction: Secondary | ICD-10-CM | POA: Diagnosis not present

## 2021-10-24 DIAGNOSIS — K219 Gastro-esophageal reflux disease without esophagitis: Secondary | ICD-10-CM | POA: Diagnosis not present

## 2021-10-24 DIAGNOSIS — Z7982 Long term (current) use of aspirin: Secondary | ICD-10-CM | POA: Diagnosis not present

## 2021-10-30 DIAGNOSIS — Z9181 History of falling: Secondary | ICD-10-CM | POA: Diagnosis not present

## 2021-10-30 DIAGNOSIS — I69322 Dysarthria following cerebral infarction: Secondary | ICD-10-CM | POA: Diagnosis not present

## 2021-10-30 DIAGNOSIS — I69392 Facial weakness following cerebral infarction: Secondary | ICD-10-CM | POA: Diagnosis not present

## 2021-10-30 DIAGNOSIS — Z87440 Personal history of urinary (tract) infections: Secondary | ICD-10-CM | POA: Diagnosis not present

## 2021-10-30 DIAGNOSIS — I080 Rheumatic disorders of both mitral and aortic valves: Secondary | ICD-10-CM | POA: Diagnosis not present

## 2021-10-30 DIAGNOSIS — Z85038 Personal history of other malignant neoplasm of large intestine: Secondary | ICD-10-CM | POA: Diagnosis not present

## 2021-10-30 DIAGNOSIS — Z7982 Long term (current) use of aspirin: Secondary | ICD-10-CM | POA: Diagnosis not present

## 2021-10-30 DIAGNOSIS — E039 Hypothyroidism, unspecified: Secondary | ICD-10-CM | POA: Diagnosis not present

## 2021-10-30 DIAGNOSIS — K219 Gastro-esophageal reflux disease without esophagitis: Secondary | ICD-10-CM | POA: Diagnosis not present

## 2021-10-30 DIAGNOSIS — I69328 Other speech and language deficits following cerebral infarction: Secondary | ICD-10-CM | POA: Diagnosis not present

## 2021-10-30 DIAGNOSIS — Z7902 Long term (current) use of antithrombotics/antiplatelets: Secondary | ICD-10-CM | POA: Diagnosis not present

## 2021-10-30 DIAGNOSIS — I69391 Dysphagia following cerebral infarction: Secondary | ICD-10-CM | POA: Diagnosis not present

## 2021-10-30 DIAGNOSIS — M479 Spondylosis, unspecified: Secondary | ICD-10-CM | POA: Diagnosis not present

## 2021-10-30 DIAGNOSIS — I10 Essential (primary) hypertension: Secondary | ICD-10-CM | POA: Diagnosis not present

## 2021-10-30 DIAGNOSIS — F32A Depression, unspecified: Secondary | ICD-10-CM | POA: Diagnosis not present

## 2021-10-30 DIAGNOSIS — N183 Chronic kidney disease, stage 3 unspecified: Secondary | ICD-10-CM | POA: Diagnosis not present

## 2021-10-30 DIAGNOSIS — E1122 Type 2 diabetes mellitus with diabetic chronic kidney disease: Secondary | ICD-10-CM | POA: Diagnosis not present

## 2021-10-30 DIAGNOSIS — E785 Hyperlipidemia, unspecified: Secondary | ICD-10-CM | POA: Diagnosis not present

## 2021-11-04 ENCOUNTER — Encounter: Payer: Self-pay | Admitting: Adult Health

## 2021-11-04 ENCOUNTER — Ambulatory Visit: Payer: Medicare Other | Admitting: Adult Health

## 2021-11-04 VITALS — BP 113/73 | HR 63 | Ht 64.0 in | Wt 136.0 lb

## 2021-11-04 DIAGNOSIS — E785 Hyperlipidemia, unspecified: Secondary | ICD-10-CM

## 2021-11-04 DIAGNOSIS — I480 Paroxysmal atrial fibrillation: Secondary | ICD-10-CM

## 2021-11-04 DIAGNOSIS — I639 Cerebral infarction, unspecified: Secondary | ICD-10-CM

## 2021-11-04 DIAGNOSIS — I10 Essential (primary) hypertension: Secondary | ICD-10-CM

## 2021-11-04 NOTE — Patient Instructions (Addendum)
Continue Eliquis (apixaban) daily  and simvastatin  for secondary stroke prevention  Continue to follow with cardiology for atrial fibrillation and Eliquis management  Continue to follow up with PCP regarding cholesterol and blood pressure management  Maintain strict control of hypertension with blood pressure goal below 130/90 and cholesterol with LDL cholesterol (bad cholesterol) goal below 70 mg/dL.   Continue working with therapies for hopeful ongoing recovery      Followup in the future with me in 6 months or call earlier if needed       Thank you for coming to see [place] at [facility]. I hope we have been able to provide you high quality care today.  You may receive a patient satisfaction survey over the next few weeks. We would appreciate your feedback and comments so that we may continue to improve ourselves and the health of our patients.    Managing Anxiety, Adult After being diagnosed with anxiety, you may be relieved to know why you have felt or behaved a certain way. You may also feel overwhelmed about the treatment ahead and what it will mean for your life. With care and support, you can manage this condition. How to manage lifestyle changes Managing stress and anxiety Stress is your body's reaction to life changes and events, both good and bad. Most stress will last just a few hours, but stress can be ongoing and can lead to more than just stress. Although stress can play a major role in anxiety, it is not the same as anxiety. Stress is usually caused by something external, such as a deadline, test, or competition. Stress normally passes after the triggering event has ended.  Anxiety is caused by something internal, such as imagining a terrible outcome or worrying that something will go wrong that will devastate you. Anxiety often does not go away even after the triggering event is over, and it can become long-term (chronic) worry. It is important to  understand the differences between stress and anxiety and to manage your stress effectively so that it does not lead to an anxious response. Talk with your health care provider or a counselor to learn more about reducing anxiety and stress. He or she may suggest tension reduction techniques, such as: Music therapy. Spend time creating or listening to music that you enjoy and that inspires you. Mindfulness-based meditation. Practice being aware of your normal breaths while not trying to control your breathing. It can be done while sitting or walking. Centering prayer. This involves focusing on a word, phrase, or sacred image that means something to you and brings you peace. Deep breathing. To do this, expand your stomach and inhale slowly through your nose. Hold your breath for 3-5 seconds. Then exhale slowly, letting your stomach muscles relax. Self-talk. Learn to notice and identify thought patterns that lead to anxiety reactions and change those patterns to thoughts that feel peaceful. Muscle relaxation. Taking time to tense muscles and then relax them. Choose a tension reduction technique that fits your lifestyle and personality. These techniques take time and practice. Set aside 5-15 minutes a day to do them. Therapists can offer counseling and training in these techniques. The training to help with anxiety may be covered by some insurance plans. Other things you can do to manage stress and anxiety include: Keeping a stress diary. This can help you learn what triggers your reaction and then learn ways to manage your response. Thinking about how you react to certain situations. You may not  be able to control everything, but you can control your response. Making time for activities that help you relax and not feeling guilty about spending your time in this way. Doing visual imagery. This involves imagining or creating mental pictures to help you relax. Practicing yoga. Through yoga poses, you can  lower tension and promote relaxation.  Medicines Medicines can help ease symptoms. Medicines for anxiety include: Antidepressant medicines. These are usually prescribed for long-term daily control. Anti-anxiety medicines. These may be added in severe cases, especially when panic attacks occur. Medicines will be prescribed by a health care provider. When used together, medicines, psychotherapy, and tension reduction techniques may be the most effective treatment. Relationships Relationships can play a big part in helping you recover. Try to spend more time connecting with trusted friends and family members. Consider going to couples counseling if you have a partner, taking family education classes, or going to family therapy. Therapy can help you and others better understand your condition. How to recognize changes in your anxiety Everyone responds differently to treatment for anxiety. Recovery from anxiety happens when symptoms decrease and stop interfering with your daily activities at home or work. This may mean that you will start to: Have better concentration and focus. Worry will interfere less in your daily thinking. Sleep better. Be less irritable. Have more energy. Have improved memory. It is also important to recognize when your condition is getting worse. Contact your health care provider if your symptoms interfere with home or work and you feel like your condition is not improving. Follow these instructions at home: Activity Exercise. Adults should do the following: Exercise for at least 150 minutes each week. The exercise should increase your heart rate and make you sweat (moderate-intensity exercise). Strengthening exercises at least twice a week. Get the right amount and quality of sleep. Most adults need 7-9 hours of sleep each night. Lifestyle  Eat a healthy diet that includes plenty of vegetables, fruits, whole grains, low-fat dairy products, and lean protein. Do not eat  a lot of foods that are high in fats, added sugars, or salt (sodium). Make choices that simplify your life. Do not use any products that contain nicotine or tobacco. These products include cigarettes, chewing tobacco, and vaping devices, such as e-cigarettes. If you need help quitting, ask your health care provider. Avoid caffeine, alcohol, and certain over-the-counter cold medicines. These may make you feel worse. Ask your pharmacist which medicines to avoid. General instructions Take over-the-counter and prescription medicines only as told by your health care provider. Keep all follow-up visits. This is important. Where to find support You can get help and support from these sources: Self-help groups. Online and [employer]. A trusted spiritual leader. Couples counseling. Family education classes. Family therapy. Where to find more information You may find that joining a support group helps you deal with your anxiety. The following sources can help you locate counselors or support groups near you: [place]: www.mentalhealthamerica.net Anxiety and Depression Association of [place] (ADAA): https://www.[place]ark.net/ National Alliance on Mental Illness (NAMI): www.nami.org Contact a health care provider if: You have a hard time staying focused or finishing daily tasks. You spend many hours a day feeling worried about everyday life. You become exhausted by worry. You start to have headaches or frequently feel tense. You develop chronic nausea or diarrhea. Get help right away if: You have a racing heart and shortness of breath. You have thoughts of hurting yourself or others. If you ever feel like you may  hurt yourself or others, or have thoughts about taking your own life, get help right away. Go to your nearest emergency department or: Call your local emergency services (911 in the [place] Call a suicide crisis helpline, such as the [facility] at  640 037 7070 or 988 in the [place]. This is open 24 hours a day in the [place] Text the Crisis Text Line at 610 238 3140 (in the [place] Summary Taking steps to learn and use tension reduction techniques can help calm you and help prevent triggering an anxiety reaction. When used together, medicines, psychotherapy, and tension reduction techniques may be the most effective treatment. Family, friends, and partners can play a big part in supporting you. This information is not intended to replace advice given to you by your health care provider. Make sure you discuss any questions you have with your health care provider. Document Revised: 06/11/2021 Document Reviewed: 03/09/2021 Elsevier Patient Education  [place].

## 2021-11-04 NOTE — Progress Notes (Signed)
[facility] [address] [place] [address] [phone]       HOSPITAL FOLLOW UP NOTE  [name] Date of Birth:  Jul 23, 1929 Medical Record Number:  [id number]   Reason for Referral:  hospital stroke follow up    SUBJECTIVE:   CHIEF COMPLAINT:  Chief Complaint  Patient presents with   Follow-up    RM 3 with spouse lampros  Pt is well, having some numbness in hands and drooping in L eyelid     HPI:   Anne [name] is a 87 y.o. female with a medical history significant for type 2 diabetes mellitus with diabetic chronic kidney disease stage III, depression, GERD, and colon cancer s/p hemicolectomy in 2015 in remission who presented to the ED on 08/28/2021 via EMS for evaluation of acute onset left facial droop and slurred speech.  Personally reviewed hospitalization pertinent progress notes, lab work and imaging.  Stroke work-up revealed right frontal cortical infarct s/p tNK, embolic pattern secondary to unknown source concerning for occult A. fib.  CTA head/neck moderate left cavernous ICA stenosis.  EF 60 to 65%.  LE Doppler negative.  Patient refused loop recorder but agreed to 30-day cardiac event monitor to assess for A. fib.  LDL 99.  A1c 5.9.  Recommended DAPT for 3 weeks and aspirin alone.  BP elevated upon arrival eventually stabilized and started amlodipine. Switched simvastatin to atorvastatin 20 mg daily.  No prior stroke history. Residual moderate dysarthria and mild left facial droop.  PT/OT recommended SNF and discharge to [facility] on 09/03/2021   Today, 11/04/2021, patient being seen for initial hospital follow-up accompanied by her husband.  She has since returned back home from SNF on 10/21. She has been doing HH therapies and has 1 additional session and then plans on completing. Has been recovering well. Does have some residual facial weakness and at times feels like her left eye lid droops.  Does have some mild gait  impairment with imbalance but overall greatly improving.  Ambulates without assistive device.  Does report occasional hand numbness which was present prior to her stroke.  Denies new stroke/TIA symptoms  Completed cardiac monitor which showed evidence of A. Fib and started on Eliquis 5 mg twice daily.  She has remained on Eliquis without side effects.  She has continued on simvastatin 20 mg daily with difficulty tolerating atorvastatin. Plans on repeating cholesterol levels with PCP at follow up visit.  Blood pressure today 113/73 on amlodipine 5 mg daily  Does have chronic anxiety with use of Xanax which she takes on a daily basis. Her husband mentions concern of use of Sudafed routinely which she reports calms her down and gets rid of her pain (unable to state exact location of pain) - per husband, she has been doing this for year.  Difficulty getting full history out of patient - husband would become frustrated easily with her and they would start bantering back-and-forth until subject was changed.  No further concerns at this time     PERTINENT IMAGING  Per recent hospitalization Code Stroke CT head No acute abnormality. CTA head & neck No large vessel occlusion. Moderate left paraclinoid ICA stenosis.  MRI Small acute right frontal infarct. 2D Echo EF 60-65%   LE venous Doppler negative for DVT    ROS:   14 system review of systems performed and negative with exception of those listed in HPI  PMH:  Past Medical History:  Diagnosis Date   Abdominal  distension    Abdominal pain    Anxiety    facial twitch and click , twitch lt shoulder  due to anxiety   Arthritis    right first finger   Chronic kidney disease    hx uti 1 mo ago was med tx    Constipation    Depression    GERD (gastroesophageal reflux disease)    Hypothyroidism    Itching    at times, area varies   No pertinent past medical history    OCC PAINS TO ABDOMEN AND CHEST , DR BYERLY PLACED PATIENT ON MED FOR  GERD FOR 2 WEEKS   Thyroid disease     PSH:  Past Surgical History:  Procedure Laterality Date   ABDOMINAL HYSTERECTOMY     partial - patient does not remember date   APPENDECTOMY  1948   bladder tack     Patient does not remember date   CHOLECYSTECTOMY  12/30/2011   Procedure: LAPAROSCOPIC CHOLECYSTECTOMY WITH INTRAOPERATIVE CHOLANGIOGRAM;  Surgeon: Stark Klein, MD;  Location: [facility];  Service: General;  Laterality: N/A;   colonscopy  01-12-2014   LAPAROSCOPIC RIGHT HEMI COLECTOMY Right 12/19/2013   Procedure: LAPAROSCOPIC RIGHT HEMI COLECTOMY;  Surgeon: Shann Medal, MD;  Location: [facility];  Service: General;  Laterality: Right;   NOSE SURGERY     1970   vericose vein removal     legs. does not remember date of procedure    Social History:  Social History   Socioeconomic History   Marital status: Married    Spouse name: Not on file   Number of children: 3   Years of education: Not on file   Highest education level: Not on file  Occupational History   Not on file  Tobacco Use   Smoking status: Never   Smokeless tobacco: Never  Substance and Sexual Activity   Alcohol use: No   Drug use: No   Sexual activity: Not on file  Other Topics Concern   Not on file  Social History Narrative   Not on file   Social Determinants of Health   Financial Resource Strain: Not on file  Food Insecurity: Not on file  Transportation Needs: Not on file  Physical Activity: Not on file  Stress: Not on file  Social Connections: Not on file  Intimate Partner Violence: Not on file    Family History:  Family History  Problem Relation Age of Onset   Stroke Mother    Cancer Sister        breast   Cancer Brother        lung and thyroid    Medications:   Current Outpatient Medications on File Prior to Visit  Medication Sig Dispense Refill   ALPRAZolam (XANAX) 0.25 MG tablet Take 0.125 mg by mouth daily.     amLODipine (NORVASC) 5 MG tablet Take 1 tablet (5 mg total) by mouth  daily. 60 tablet 0   apixaban (ELIQUIS) 5 MG TABS tablet Take 1 tablet (5 mg total) by mouth 2 (two) times daily. 60 tablet 3   Cholecalciferol (VITAMIN D3) 1000 UNITS CAPS Take 1,000 Units by mouth daily.      ELDERBERRY PO Take 5 mLs by mouth. "boost immune system"     levothyroxine (SYNTHROID) 75 MCG tablet Take 75 mcg by mouth daily before breakfast.     polyethylene glycol (MIRALAX / GLYCOLAX) packet Take 17 g by mouth daily as needed for mild constipation (constipation).  simvastatin (ZOCOR) 20 MG tablet Take 20 mg by mouth daily.     traMADol (ULTRAM) 50 MG tablet Take 50 mg by mouth every 6 (six) hours as needed for moderate pain.     No current facility-administered medications on file prior to visit.    Allergies:  No Known Allergies    OBJECTIVE:  Physical Exam  Vitals:   11/04/21 1328  BP: 113/73  Pulse: 63  Weight: 136 lb (61.7 kg)  Height: 5\' 4"  (1.626 m)   Body mass index is 23.34 kg/m. No results found.  General: Frail very pleasant elderly female, seated, in no evident distress Head: head normocephalic and atraumatic.   Neck: supple with no carotid or supraclavicular bruits Cardiovascular: regular rate and rhythm, no murmurs Musculoskeletal: no deformity Skin:  no rash/petichiae Vascular:  Normal pulses all extremities   Neurologic Exam Mental Status: Awake and fully alert.  Mild to moderate dysarthria.  No evidence of aphasia.  Oriented to place and time. Recent memory impaired and remote memory intact. Attention span and concentration intact and fund of knowledge slightly impaired. Mood and affect appropriate.  Cranial Nerves: Fundoscopic exam reveals sharp disc margins. Pupils equal, briskly reactive to light. Extraocular movements full without nystagmus. Visual fields full to confrontation. Hearing intact. Facial sensation intact.  Mild right lower facial weakness.  Tongue, and palate moves normally and symmetrically.  Motor: Normal bulk and tone.  Normal strength in all tested extremity muscles Sensory.: intact to touch , pinprick , position and vibratory sensation.  Coordination: Rapid alternating movements normal in all extremities. Finger-to-nose and heel-to-shin performed accurately bilaterally. Gait and Station: Arises from chair without difficulty. Stance is normal. Gait demonstrates normal stride length and mild imbalance greater when making turns without use of AD.  Unable to perform tandem walk and heel toe.  Reflexes: 1+ and symmetric. Toes downgoing.     NIHSS  2 Modified Rankin  1-2      ASSESSMENT: Anne [name] is a 87 y.o. year old female with right frontal cortical infarct s/p TNK on 05/13/4314 embolic pattern secondary to unclear source although evidence of atrial fibrillation on cardiac monitor after discharge likely cause of stroke. Vascular risk factors include new dx of atrial fibrillation, HTN, HLD and advanced age.      PLAN:  Right frontal stroke:  Residual deficit: left facial weakness, dysarthria and mild imbalance.  Continue working with therapies for likely ongoing recovery Continue Eliquis (apixaban) daily  and simvastatin 20 mg daily for secondary stroke prevention.   Discussed secondary stroke prevention measures and importance of close PCP follow up for aggressive stroke risk factor management. I have gone over the pathophysiology of stroke, warning signs and symptoms, risk factors and their management in some detail with instructions to go to the closest emergency room for symptoms of concern. Atrial fibrillation: Likely cause of recent stroke.  Continue Eliquis 5 mg twice daily for CHA2DS2-VASc score of at least 7 followed by cardiology HTN: BP goal <130/90.  Stable on current regimen managed by PCP HLD: LDL goal <70. Recent LDL 99.  Continue simvastatin 20 mg daily. Ensure f/u with PCP for repeat labs - if LDL remains above goal, would recommend either increasing simvastatin dose or change to  different therapy Anxiety: Chronic issue.  Use of Xanax as needed.  Discussed stress relaxation techniques with additional information provided to limit use of Xanax. Discussed inappropriate use of Sudafed and if she continues to struggle with anxiety to further discuss with PCP  Follow up in 6 months or call earlier if needed   CC:  [facility] provider: Dr. Leonie Man PCP: Janie Morning, DO    I spent 59 minutes of face-to-face and non-face-to-face time with patient and husband.  This included previsit chart review including review of recent hospitalization, lab review, study review, electronic health record documentation, patient and husband education regarding recent stroke including etiology, secondary stroke prevention measures and importance of managing stroke risk factors, residual deficits and typical recovery time and answered all other questions to patient and husband's satisfaction  Frann Rider, AGNP-BC  [facility] [address] [place], [place] [address]  Phone [phone] Fax [phone] Note: This document was prepared with digital dictation and possible smart phrase technology. Any transcriptional errors that result from this process are unintentional.

## 2021-11-07 ENCOUNTER — Ambulatory Visit: Payer: Medicare Other | Admitting: Student

## 2021-11-07 NOTE — Progress Notes (Signed)
I agree with the above plan 

## 2021-11-14 DIAGNOSIS — K219 Gastro-esophageal reflux disease without esophagitis: Secondary | ICD-10-CM | POA: Diagnosis not present

## 2021-11-14 DIAGNOSIS — E1122 Type 2 diabetes mellitus with diabetic chronic kidney disease: Secondary | ICD-10-CM | POA: Diagnosis not present

## 2021-11-14 DIAGNOSIS — I69328 Other speech and language deficits following cerebral infarction: Secondary | ICD-10-CM | POA: Diagnosis not present

## 2021-11-14 DIAGNOSIS — I080 Rheumatic disorders of both mitral and aortic valves: Secondary | ICD-10-CM | POA: Diagnosis not present

## 2021-11-14 DIAGNOSIS — Z87440 Personal history of urinary (tract) infections: Secondary | ICD-10-CM | POA: Diagnosis not present

## 2021-11-14 DIAGNOSIS — E039 Hypothyroidism, unspecified: Secondary | ICD-10-CM | POA: Diagnosis not present

## 2021-11-14 DIAGNOSIS — E785 Hyperlipidemia, unspecified: Secondary | ICD-10-CM | POA: Diagnosis not present

## 2021-11-14 DIAGNOSIS — I10 Essential (primary) hypertension: Secondary | ICD-10-CM | POA: Diagnosis not present

## 2021-11-14 DIAGNOSIS — M479 Spondylosis, unspecified: Secondary | ICD-10-CM | POA: Diagnosis not present

## 2021-11-14 DIAGNOSIS — I69322 Dysarthria following cerebral infarction: Secondary | ICD-10-CM | POA: Diagnosis not present

## 2021-11-14 DIAGNOSIS — Z9181 History of falling: Secondary | ICD-10-CM | POA: Diagnosis not present

## 2021-11-14 DIAGNOSIS — Z85038 Personal history of other malignant neoplasm of large intestine: Secondary | ICD-10-CM | POA: Diagnosis not present

## 2021-11-14 DIAGNOSIS — I69392 Facial weakness following cerebral infarction: Secondary | ICD-10-CM | POA: Diagnosis not present

## 2021-11-14 DIAGNOSIS — I69391 Dysphagia following cerebral infarction: Secondary | ICD-10-CM | POA: Diagnosis not present

## 2021-11-14 DIAGNOSIS — N183 Chronic kidney disease, stage 3 unspecified: Secondary | ICD-10-CM | POA: Diagnosis not present

## 2021-11-14 DIAGNOSIS — Z7982 Long term (current) use of aspirin: Secondary | ICD-10-CM | POA: Diagnosis not present

## 2021-11-14 DIAGNOSIS — F32A Depression, unspecified: Secondary | ICD-10-CM | POA: Diagnosis not present

## 2021-11-14 DIAGNOSIS — Z7902 Long term (current) use of antithrombotics/antiplatelets: Secondary | ICD-10-CM | POA: Diagnosis not present

## 2021-12-11 ENCOUNTER — Other Ambulatory Visit: Payer: Self-pay

## 2021-12-11 NOTE — Patient Outreach (Signed)
[facility]) Care Management  12/11/2021  Anne [name] 1929/08/30 [id number]   Telephone outreach to patient's husband to obtain mRS was successfully completed. MRS= 1   [facility] Care Management Assistant

## 2022-01-31 ENCOUNTER — Other Ambulatory Visit (HOSPITAL_COMMUNITY): Payer: Self-pay | Admitting: Physician Assistant

## 2022-01-31 DIAGNOSIS — I480 Paroxysmal atrial fibrillation: Secondary | ICD-10-CM

## 2022-02-02 NOTE — Telephone Encounter (Signed)
Eliquis '5mg'$  refill request received. Patient is 88 years old, weight-61.7kg, Crea-0.90 on 10/20/2021, Diagnosis-Afib, and last seen by Oda Kilts on 10/20/2021. Dose is appropriate based on dosing criteria. Will send in refill to requested pharmacy.   ?

## 2022-05-07 ENCOUNTER — Ambulatory Visit: Payer: Medicare Other | Admitting: Adult Health

## 2022-07-01 DIAGNOSIS — N1831 Chronic kidney disease, stage 3a: Secondary | ICD-10-CM | POA: Diagnosis not present

## 2022-07-01 DIAGNOSIS — E1159 Type 2 diabetes mellitus with other circulatory complications: Secondary | ICD-10-CM | POA: Diagnosis not present

## 2022-07-01 DIAGNOSIS — E039 Hypothyroidism, unspecified: Secondary | ICD-10-CM | POA: Diagnosis not present

## 2022-07-01 DIAGNOSIS — I129 Hypertensive chronic kidney disease with stage 1 through stage 4 chronic kidney disease, or unspecified chronic kidney disease: Secondary | ICD-10-CM | POA: Diagnosis not present

## 2022-07-01 DIAGNOSIS — E1122 Type 2 diabetes mellitus with diabetic chronic kidney disease: Secondary | ICD-10-CM | POA: Diagnosis not present

## 2022-07-01 DIAGNOSIS — E785 Hyperlipidemia, unspecified: Secondary | ICD-10-CM | POA: Diagnosis not present

## 2022-07-08 DIAGNOSIS — E039 Hypothyroidism, unspecified: Secondary | ICD-10-CM | POA: Diagnosis not present

## 2022-07-08 DIAGNOSIS — R42 Dizziness and giddiness: Secondary | ICD-10-CM | POA: Diagnosis not present

## 2022-07-08 DIAGNOSIS — Z8673 Personal history of transient ischemic attack (TIA), and cerebral infarction without residual deficits: Secondary | ICD-10-CM | POA: Diagnosis not present

## 2022-07-08 DIAGNOSIS — E1159 Type 2 diabetes mellitus with other circulatory complications: Secondary | ICD-10-CM | POA: Diagnosis not present

## 2022-07-08 DIAGNOSIS — Z0000 Encounter for general adult medical examination without abnormal findings: Secondary | ICD-10-CM | POA: Diagnosis not present

## 2022-07-30 DIAGNOSIS — K219 Gastro-esophageal reflux disease without esophagitis: Secondary | ICD-10-CM | POA: Diagnosis not present

## 2022-07-30 DIAGNOSIS — I700 Atherosclerosis of aorta: Secondary | ICD-10-CM | POA: Diagnosis not present

## 2022-07-30 DIAGNOSIS — E785 Hyperlipidemia, unspecified: Secondary | ICD-10-CM | POA: Diagnosis not present

## 2022-07-30 DIAGNOSIS — E039 Hypothyroidism, unspecified: Secondary | ICD-10-CM | POA: Diagnosis not present

## 2022-09-01 ENCOUNTER — Encounter: Payer: Self-pay | Admitting: Internal Medicine

## 2022-09-01 ENCOUNTER — Ambulatory Visit: Payer: Medicare Other | Attending: Internal Medicine | Admitting: Internal Medicine

## 2022-09-01 VITALS — BP 138/76 | HR 57 | Ht 64.0 in | Wt 127.0 lb

## 2022-09-01 DIAGNOSIS — I480 Paroxysmal atrial fibrillation: Secondary | ICD-10-CM | POA: Diagnosis not present

## 2022-09-01 DIAGNOSIS — Z79899 Other long term (current) drug therapy: Secondary | ICD-10-CM | POA: Diagnosis not present

## 2022-09-01 MED ORDER — APIXABAN 2.5 MG PO TABS: 2.5000 mg | ORAL_TABLET | Freq: Two times a day (BID) | ORAL | 11 refills | Status: DC

## 2022-09-01 NOTE — Patient Instructions (Signed)
Medication Instructions:  Your physician has recommended you make the following change in your medication:   ** Stop Eliquis '5mg'$    ** Begin Eliquis 2.'5mg'$  - 1 tablet by mouth twice daily  *If you need a refill on your cardiac medications before your next appointment, please call your pharmacy*   Lab Work: CBC and BMET today  If you have labs (blood work) drawn today and your tests are completely normal, you will receive your results only by: [place] (if you have MyChart) OR A paper copy in the mail If you have any lab test that is abnormal or we need to change your treatment, we will call you to review the results.   Testing/Procedures: None ordered.    Follow-Up: At [facility], you and your health needs are our priority.  As part of our continuing mission to provide you with exceptional heart care, we have created designated Provider Care Teams.  These Care Teams include your primary Cardiologist (physician) and Advanced Practice Providers (APPs -  Physician Assistants and Nurse Practitioners) who all work together to provide you with the care you need, when you need it.  We recommend signing up for the patient portal called "MyChart".  Sign up information is provided on this After Visit Summary.  MyChart is used to connect with patients for Virtual Visits (Telemedicine).  Patients are able to view lab/test results, encounter notes, upcoming appointments, etc.  Non-urgent messages can be sent to your provider as well.   To learn more about what you can do with MyChart, go to [email or web address].    Your next appointment:   12 months with Dr Olin Pia PA  Important Information About [place]

## 2022-09-01 NOTE — Progress Notes (Signed)
Patient Care Team: Janie Morning, DO as PCP - General (Family Medicine) Deboraha Sprang, MD as PCP - Electrophysiology (Cardiology) Juanita Craver, MD as Consulting Physician (Gastroenterology) Ladell Pier, MD as Consulting Physician (Oncology) Alphonsa Overall, MD as Consulting Physician (General Surgery)   HPI  Anne [name] is a 88 y.o. female Seen in followup for cryptogenic stroke 10/22 at which time she was offered and declined after discussion about the prospective data implantation of a loop recorder in the setting of significant atrial ectopy, elected a 30-day monitor which subsequently demonstrated atrial fibrillation with a 17% burden.  Anticoagulation was initiated  I had seen her previously in 7/18 for bradycardia with some junctional rhythm Holter monitor showed pauses up to 2.5 seconds.  No clear association.  Left side pains.  Aggravated by arguing and food.  Also postprandial bloating.  Some shortness of breath, chronic two-pillow orthopnea.  No edema.  They tell me that they are 65 or so-year-old son died a couple of years ago of pulmonary emboli  Date Cr K Hgb  11/22 0.9 4.0 14.7         DATE TEST EF   9//22 Echo    60-65 %              Thromboembolic risk factors ( age -9 , TIA/CVA-2, Gender-1) for a CHADSVASc Score of >=5    Records and Results Reviewed   Past Medical History:  Diagnosis Date   Abdominal distension    Abdominal pain    Anxiety    facial twitch and click , twitch lt shoulder  due to anxiety   Arthritis    right first finger   Chronic kidney disease    hx uti 1 mo ago was med tx    Constipation    Depression    GERD (gastroesophageal reflux disease)    Hypothyroidism    Itching    at times, area varies   No pertinent past medical history    OCC PAINS TO ABDOMEN AND CHEST , DR BYERLY PLACED PATIENT ON MED FOR GERD FOR 2 WEEKS   Thyroid disease     Past Surgical History:  Procedure Laterality Date    ABDOMINAL HYSTERECTOMY     partial - patient does not remember date   APPENDECTOMY  1948   bladder tack     Patient does not remember date   CHOLECYSTECTOMY  12/30/2011   Procedure: LAPAROSCOPIC CHOLECYSTECTOMY WITH INTRAOPERATIVE CHOLANGIOGRAM;  Surgeon: Stark Amarisa Wilinski, MD;  Location: [facility];  Service: General;  Laterality: N/A;   colonscopy  01-12-2014   LAPAROSCOPIC RIGHT HEMI COLECTOMY Right 12/19/2013   Procedure: LAPAROSCOPIC RIGHT HEMI COLECTOMY;  Surgeon: Shann Medal, MD;  Location: [facility];  Service: General;  Laterality: Right;   NOSE SURGERY     1970   vericose vein removal     legs. does not remember date of procedure    Current Meds  Medication Sig   ALPRAZolam (XANAX) 0.25 MG tablet Take 0.125 mg by mouth daily.   apixaban (ELIQUIS) 5 MG TABS tablet TAKE 1 TABLET(5 MG) BY MOUTH TWICE DAILY   Cholecalciferol (VITAMIN D3) 1000 UNITS CAPS Take 1,000 Units by mouth daily.    ELDERBERRY PO Take 5 mLs by mouth. "boost immune system"   levothyroxine (SYNTHROID) 75 MCG tablet Take 75 mcg by mouth daily before breakfast.   polyethylene glycol (MIRALAX / GLYCOLAX) packet Take 17 g by mouth daily as needed  for mild constipation (constipation).   simvastatin (ZOCOR) 20 MG tablet Take 20 mg by mouth daily.    No Known Allergies    Review of Systems negative except from HPI and PMH  Physical Exam BP 138/76   Pulse (!) 57   Ht '5\' 4"'$  (1.626 m)   Wt 127 lb (57.6 kg)   SpO2 99%   BMI 21.80 kg/m  Well developed and well nourished in no acute distress HENT normal E scleral and icterus clear Neck Supple JVP flat; carotids brisk and full Clear to ausculation Regular rate and rhythm, no murmurs gallops or rub Soft with active bowel sounds No clubbing cyanosis  Edema Alert and oriented, grossly normal motor and sensory function Skin Warm and Dry  ECG junctional at 57 Intervals-/07/42 Retrograde P wave notable in lead II approximately 100 ms after the onset of the QRS  CrCl  cannot be calculated (Patient's most recent lab result is older than the maximum 21 days allowed.).   Assessment and  Plan Cryptogenic Stroke  Bradycardia-sinus with junctional rhythm  Abdominal pain-postprandial  The patient has had no interval neurological events.  No bleeding.  On Eliquis.  Given age and weight, we will decrease the dose to 2.5 mg twice daily.  We will plan to see her again in 1 year.  Bradycardia but not clearly symptomatic.      Current medicines are reviewed at length with the patient today .  The patient does not  have concerns regarding medicines.

## 2022-09-02 LAB — CBC
Hematocrit: 44.3 % (ref 34.0–46.6)
Hemoglobin: 14.8 g/dL (ref 11.1–15.9)
MCH: 31.2 pg (ref 26.6–33.0)
MCHC: 33.4 g/dL (ref 31.5–35.7)
MCV: 94 fL (ref 79–97)
Platelets: 157 10*3/uL (ref 150–450)
RBC: 4.74 x10E6/uL (ref 3.77–5.28)
RDW: 12.4 % (ref 11.7–15.4)
WBC: 5.5 10*3/uL (ref 3.4–10.8)

## 2022-09-02 LAB — BASIC METABOLIC PANEL
BUN/Creatinine Ratio: 20 (ref 12–28)
BUN: 21 mg/dL (ref 10–36)
CO2: 24 mmol/L (ref 20–29)
Calcium: 9.6 mg/dL (ref 8.7–10.3)
Chloride: 104 mmol/L (ref 96–106)
Creatinine, Ser: 1.04 mg/dL — ABNORMAL HIGH (ref 0.57–1.00)
Glucose: 105 mg/dL — ABNORMAL HIGH (ref 70–99)
Potassium: 4.5 mmol/L (ref 3.5–5.2)
Sodium: 143 mmol/L (ref 134–144)
eGFR: 51 mL/min/{1.73_m2} — ABNORMAL LOW (ref >59)

## 2022-12-21 ENCOUNTER — Telehealth: Payer: Self-pay | Admitting: Internal Medicine

## 2022-12-21 DIAGNOSIS — I480 Paroxysmal atrial fibrillation: Secondary | ICD-10-CM

## 2022-12-21 NOTE — Telephone Encounter (Signed)
*  STAT* If patient is at the pharmacy, call can be transferred to refill team.   1. Which medications need to be refilled? (please list name of each medication and dose if known)  apixaban (ELIQUIS) 2.5 MG TABS tablet  2. Which pharmacy/location (including street and city if local pharmacy) is medication to be sent to?  [place] [place]    3. Do they need a 30 day or 90 day supply?  90 day

## 2022-12-22 MED ORDER — APIXABAN 2.5 MG PO TABS: 2.5000 mg | ORAL_TABLET | Freq: Two times a day (BID) | ORAL | 1 refills | Status: DC

## 2022-12-22 NOTE — Telephone Encounter (Signed)
Prescription refill request for Eliquis received. Indication:Afib  Last office visit: 09/01/22 Anne [name])  Scr: 1.04 (09/01/22 ) Age: 89 Weight: 57.6kg  Appropriate dose and refill sent to requested pharmacy. Marland Kitchen

## 2022-12-31 DIAGNOSIS — K219 Gastro-esophageal reflux disease without esophagitis: Secondary | ICD-10-CM | POA: Diagnosis not present

## 2022-12-31 DIAGNOSIS — E785 Hyperlipidemia, unspecified: Secondary | ICD-10-CM | POA: Diagnosis not present

## 2022-12-31 DIAGNOSIS — Z8673 Personal history of transient ischemic attack (TIA), and cerebral infarction without residual deficits: Secondary | ICD-10-CM | POA: Diagnosis not present

## 2022-12-31 DIAGNOSIS — E559 Vitamin D deficiency, unspecified: Secondary | ICD-10-CM | POA: Diagnosis not present

## 2022-12-31 DIAGNOSIS — E039 Hypothyroidism, unspecified: Secondary | ICD-10-CM | POA: Diagnosis not present

## 2022-12-31 DIAGNOSIS — E1122 Type 2 diabetes mellitus with diabetic chronic kidney disease: Secondary | ICD-10-CM | POA: Diagnosis not present

## 2023-01-07 DIAGNOSIS — N1831 Chronic kidney disease, stage 3a: Secondary | ICD-10-CM | POA: Diagnosis not present

## 2023-01-07 DIAGNOSIS — I6930 Unspecified sequelae of cerebral infarction: Secondary | ICD-10-CM | POA: Diagnosis not present

## 2023-01-07 DIAGNOSIS — E559 Vitamin D deficiency, unspecified: Secondary | ICD-10-CM | POA: Diagnosis not present

## 2023-01-07 DIAGNOSIS — R110 Nausea: Secondary | ICD-10-CM | POA: Diagnosis not present

## 2023-01-07 DIAGNOSIS — I69393 Ataxia following cerebral infarction: Secondary | ICD-10-CM | POA: Diagnosis not present

## 2023-01-07 DIAGNOSIS — E785 Hyperlipidemia, unspecified: Secondary | ICD-10-CM | POA: Diagnosis not present

## 2023-01-07 DIAGNOSIS — I700 Atherosclerosis of aorta: Secondary | ICD-10-CM | POA: Diagnosis not present

## 2023-01-07 DIAGNOSIS — E1122 Type 2 diabetes mellitus with diabetic chronic kidney disease: Secondary | ICD-10-CM | POA: Diagnosis not present

## 2023-01-07 DIAGNOSIS — E039 Hypothyroidism, unspecified: Secondary | ICD-10-CM | POA: Diagnosis not present

## 2023-03-30 DIAGNOSIS — K219 Gastro-esophageal reflux disease without esophagitis: Secondary | ICD-10-CM | POA: Diagnosis not present

## 2023-03-30 DIAGNOSIS — I700 Atherosclerosis of aorta: Secondary | ICD-10-CM | POA: Diagnosis not present

## 2023-03-30 DIAGNOSIS — E039 Hypothyroidism, unspecified: Secondary | ICD-10-CM | POA: Diagnosis not present

## 2023-03-30 DIAGNOSIS — E785 Hyperlipidemia, unspecified: Secondary | ICD-10-CM | POA: Diagnosis not present

## 2023-06-16 ENCOUNTER — Other Ambulatory Visit: Payer: Self-pay | Admitting: Internal Medicine

## 2023-06-16 DIAGNOSIS — I480 Paroxysmal atrial fibrillation: Secondary | ICD-10-CM

## 2023-06-16 NOTE — Telephone Encounter (Signed)
Prescription refill request for Eliquis received. Indication: Afib  Last office visit: 09/01/22 Graciela Husbands)  Scr: 1.04 (09/01/22)  Age: 89 Weight: 57.6kg Appropriate dose. Refill sent.

## 2023-07-22 DIAGNOSIS — R7309 Other abnormal glucose: Secondary | ICD-10-CM | POA: Diagnosis not present

## 2023-07-22 DIAGNOSIS — R946 Abnormal results of thyroid function studies: Secondary | ICD-10-CM | POA: Diagnosis not present

## 2023-07-22 DIAGNOSIS — Z79899 Other long term (current) drug therapy: Secondary | ICD-10-CM | POA: Diagnosis not present

## 2023-07-22 DIAGNOSIS — Z13220 Encounter for screening for lipoid disorders: Secondary | ICD-10-CM | POA: Diagnosis not present

## 2023-07-29 DIAGNOSIS — E1159 Type 2 diabetes mellitus with other circulatory complications: Secondary | ICD-10-CM | POA: Diagnosis not present

## 2023-07-29 DIAGNOSIS — Z8673 Personal history of transient ischemic attack (TIA), and cerebral infarction without residual deficits: Secondary | ICD-10-CM | POA: Diagnosis not present

## 2023-07-29 DIAGNOSIS — Z79899 Other long term (current) drug therapy: Secondary | ICD-10-CM | POA: Diagnosis not present

## 2023-07-29 DIAGNOSIS — Z0000 Encounter for general adult medical examination without abnormal findings: Secondary | ICD-10-CM | POA: Diagnosis not present

## 2023-09-29 DIAGNOSIS — I639 Cerebral infarction, unspecified: Secondary | ICD-10-CM | POA: Insufficient documentation

## 2023-10-06 ENCOUNTER — Encounter: Payer: Self-pay | Admitting: Internal Medicine

## 2023-10-06 ENCOUNTER — Ambulatory Visit: Payer: Medicare Other | Attending: Internal Medicine | Admitting: Internal Medicine

## 2023-10-06 VITALS — BP 108/74 | HR 62 | Ht 64.0 in | Wt 126.0 lb

## 2023-10-06 DIAGNOSIS — I639 Cerebral infarction, unspecified: Secondary | ICD-10-CM | POA: Diagnosis not present

## 2023-10-06 DIAGNOSIS — R001 Bradycardia, unspecified: Secondary | ICD-10-CM

## 2023-10-06 NOTE — Progress Notes (Signed)
Patient Care Team: Irena Reichmann, DO as PCP - General (Family Medicine) Duke Salvia, MD as PCP - Electrophysiology (Cardiology) Charna Elizabeth, MD as Consulting Physician (Gastroenterology) Ladene Artist, MD as Consulting Physician (Oncology) Ovidio Kin, MD as Consulting Physician (General Surgery)   HPI  Anne [name] is a 89 y.o. female Seen in followup for cryptogenic stroke 10/22 at which time she was offered and declined after discussion about the prospective data implantation of a loop recorder in the setting of significant atrial ectopy, elected a 30-day monitor which subsequently demonstrated atrial fibrillation with a 17% burden.  Anticoagulation was initiated  I had seen her previously in 7/18 for bradycardia with some junctional rhythm Holter monitor showed pauses up to 2.5 seconds.  No clear association.    Some shortness of breath, chronic two-pillow orthopnea.  No edema.  They tell me that they are 36 or so-year-old son died a couple of years ago of pulmonary emboli  Date Cr K Hgb  11/22 0.9 4.0 14.7   10/23 1.04 4.5  14.8   DATE TEST EF   9//22 Echo    60-65 %              Thromboembolic risk factors ( age -28 , TIA/CVA-2, Gender-1) for a CHADSVASc Score of >=5    Records and Results Reviewed   Past Medical History:  Diagnosis Date   Abdominal distension    Abdominal pain    Anxiety    facial twitch and click , twitch lt shoulder  due to anxiety   Arthritis    right first finger   Chronic kidney disease    hx uti 1 mo ago was med tx    Constipation    Depression    GERD (gastroesophageal reflux disease)    Hypothyroidism    Itching    at times, area varies   No pertinent past medical history    OCC PAINS TO ABDOMEN AND CHEST , DR BYERLY PLACED PATIENT ON MED FOR GERD FOR 2 WEEKS   Thyroid disease     Past Surgical History:  Procedure Laterality Date   ABDOMINAL HYSTERECTOMY     partial - patient does not remember date    APPENDECTOMY  1948   bladder tack     Patient does not remember date   CHOLECYSTECTOMY  12/30/2011   Procedure: LAPAROSCOPIC CHOLECYSTECTOMY WITH INTRAOPERATIVE CHOLANGIOGRAM;  Surgeon: Almond Lint, MD;  Location: [facility];  Service: General;  Laterality: N/A;   colonscopy  01-12-2014   LAPAROSCOPIC RIGHT HEMI COLECTOMY Right 12/19/2013   Procedure: LAPAROSCOPIC RIGHT HEMI COLECTOMY;  Surgeon: Kandis Cocking, MD;  Location: [facility];  Service: General;  Laterality: Right;   NOSE SURGERY     1970   vericose vein removal     legs. does not remember date of procedure    Current Meds  Medication Sig   ALPRAZolam (XANAX) 0.25 MG tablet Take by mouth daily. 1 - 2 tabs prn   Cholecalciferol (VITAMIN D3) 1000 UNITS CAPS Take 1,000 Units by mouth daily.    ELDERBERRY PO Take 5 mLs by mouth. "boost immune system"   ELIQUIS 2.5 MG TABS tablet TAKE 1 TABLET(2.5 MG) BY MOUTH TWICE DAILY   levothyroxine (SYNTHROID) 75 MCG tablet Take 75 mcg by mouth daily before breakfast.   polyethylene glycol (MIRALAX / GLYCOLAX) packet Take 17 g by mouth daily as needed for mild constipation (constipation).   simvastatin (ZOCOR) 20  MG tablet Take 20 mg by mouth daily.    No Known Allergies    Review of Systems negative except from HPI and PMH  Physical Exam BP 108/74   Pulse 62   Ht 5\' 4"  (1.626 m)   Wt 126 lb (57.2 kg)   SpO2 98%   BMI 21.63 kg/m  Well developed and nourished in no acute distress HENT normal Neck supple with JVP-  flat   Clear Regular rate and rhythm, no murmurs or gallops Abd-soft with active BS No Clubbing cyanosis edema Skin-warm and dry A & Oriented  Grossly normal sensory and motor function  ECG junctional rhythm at 62 Intervals-/07/31 sharp negative deflection in lead II and III   represents a retrograde P wave  CrCl cannot be calculated (Patient's most recent lab result is older than the maximum 21 days allowed.).   Assessment and  Plan Cryptogenic  Stroke  Bradycardia-sinus with junctional rhythm  Grief  Pt with junctional rhythm.  Not clear whether it is symptomatic or not.  There is unfortunately a great deal of consternation between the 2 of them.   So it is hard to gauge symptoms.  He denies them, she has thinks they are there, but with her stroke she is not quite sure.  She does not want anything done i.e. pacing.  Will follow.         Current medicines are reviewed at length with the patient today .  The patient does not  have concerns regarding medicines.

## 2023-10-06 NOTE — Patient Instructions (Signed)

## 2023-12-06 ENCOUNTER — Other Ambulatory Visit: Payer: Self-pay | Admitting: Internal Medicine

## 2023-12-06 DIAGNOSIS — I480 Paroxysmal atrial fibrillation: Secondary | ICD-10-CM

## 2023-12-06 NOTE — Telephone Encounter (Signed)
 Eliquis  2.5mg  refill request received. Patient is 89 years old, weight-57.2kg, Crea-1.18 on 07/22/23 via [employer] tab from [facility], Diagnosis-Afib, and last seen by Dr. Fernande on 10/06/23. Dose is appropriate based on dosing criteria. Will send in refill to requested pharmacy.

## 2024-01-24 DIAGNOSIS — E039 Hypothyroidism, unspecified: Secondary | ICD-10-CM | POA: Diagnosis not present

## 2024-01-24 DIAGNOSIS — Z79899 Other long term (current) drug therapy: Secondary | ICD-10-CM | POA: Diagnosis not present

## 2024-01-24 DIAGNOSIS — E1159 Type 2 diabetes mellitus with other circulatory complications: Secondary | ICD-10-CM | POA: Diagnosis not present

## 2024-01-24 DIAGNOSIS — E785 Hyperlipidemia, unspecified: Secondary | ICD-10-CM | POA: Diagnosis not present

## 2024-01-27 DIAGNOSIS — I69393 Ataxia following cerebral infarction: Secondary | ICD-10-CM | POA: Diagnosis not present

## 2024-01-27 DIAGNOSIS — I700 Atherosclerosis of aorta: Secondary | ICD-10-CM | POA: Diagnosis not present

## 2024-01-27 DIAGNOSIS — I6930 Unspecified sequelae of cerebral infarction: Secondary | ICD-10-CM | POA: Diagnosis not present

## 2024-01-27 DIAGNOSIS — E785 Hyperlipidemia, unspecified: Secondary | ICD-10-CM | POA: Diagnosis not present

## 2024-01-27 DIAGNOSIS — E039 Hypothyroidism, unspecified: Secondary | ICD-10-CM | POA: Diagnosis not present

## 2024-01-27 DIAGNOSIS — R5381 Other malaise: Secondary | ICD-10-CM | POA: Diagnosis not present

## 2024-02-17 ENCOUNTER — Inpatient Hospital Stay (HOSPITAL_COMMUNITY)
Admission: EM | Admit: 2024-02-17 | Discharge: 2024-02-19 | DRG: 640 | Disposition: A | Discharge status: Home Health | Attending: Internal Medicine | Admitting: Internal Medicine

## 2024-02-17 ENCOUNTER — Emergency Department (HOSPITAL_COMMUNITY)

## 2024-02-17 DIAGNOSIS — Z90711 Acquired absence of uterus with remaining cervical stump: Secondary | ICD-10-CM

## 2024-02-17 DIAGNOSIS — Z79890 Hormone replacement therapy (postmenopausal): Secondary | ICD-10-CM

## 2024-02-17 DIAGNOSIS — R531 Weakness: Secondary | ICD-10-CM | POA: Diagnosis not present

## 2024-02-17 DIAGNOSIS — K219 Gastro-esophageal reflux disease without esophagitis: Secondary | ICD-10-CM | POA: Diagnosis not present

## 2024-02-17 DIAGNOSIS — N390 Urinary tract infection, site not specified: Secondary | ICD-10-CM | POA: Diagnosis not present

## 2024-02-17 DIAGNOSIS — Z7901 Long term (current) use of anticoagulants: Secondary | ICD-10-CM

## 2024-02-17 DIAGNOSIS — F0390 Unspecified dementia without behavioral disturbance: Secondary | ICD-10-CM | POA: Diagnosis present

## 2024-02-17 DIAGNOSIS — H9190 Unspecified hearing loss, unspecified ear: Secondary | ICD-10-CM | POA: Diagnosis not present

## 2024-02-17 DIAGNOSIS — I639 Cerebral infarction, unspecified: Secondary | ICD-10-CM | POA: Diagnosis not present

## 2024-02-17 DIAGNOSIS — I700 Atherosclerosis of aorta: Secondary | ICD-10-CM | POA: Diagnosis not present

## 2024-02-17 DIAGNOSIS — G9341 Metabolic encephalopathy: Secondary | ICD-10-CM | POA: Diagnosis not present

## 2024-02-17 DIAGNOSIS — Z87440 Personal history of urinary (tract) infections: Secondary | ICD-10-CM

## 2024-02-17 DIAGNOSIS — R443 Hallucinations, unspecified: Secondary | ICD-10-CM | POA: Diagnosis present

## 2024-02-17 DIAGNOSIS — I10 Essential (primary) hypertension: Secondary | ICD-10-CM | POA: Diagnosis present

## 2024-02-17 DIAGNOSIS — R1111 Vomiting without nausea: Secondary | ICD-10-CM | POA: Diagnosis not present

## 2024-02-17 DIAGNOSIS — Z6820 Body mass index (BMI) 20.0-20.9, adult: Secondary | ICD-10-CM

## 2024-02-17 DIAGNOSIS — E039 Hypothyroidism, unspecified: Secondary | ICD-10-CM | POA: Diagnosis present

## 2024-02-17 DIAGNOSIS — Z823 Family history of stroke: Secondary | ICD-10-CM | POA: Diagnosis not present

## 2024-02-17 DIAGNOSIS — Z9049 Acquired absence of other specified parts of digestive tract: Secondary | ICD-10-CM | POA: Diagnosis not present

## 2024-02-17 DIAGNOSIS — Z743 Need for continuous supervision: Secondary | ICD-10-CM | POA: Diagnosis not present

## 2024-02-17 DIAGNOSIS — R4189 Other symptoms and signs involving cognitive functions and awareness: Secondary | ICD-10-CM | POA: Diagnosis present

## 2024-02-17 DIAGNOSIS — E440 Moderate protein-calorie malnutrition: Secondary | ICD-10-CM | POA: Diagnosis present

## 2024-02-17 DIAGNOSIS — N3000 Acute cystitis without hematuria: Secondary | ICD-10-CM | POA: Diagnosis not present

## 2024-02-17 DIAGNOSIS — Z79899 Other long term (current) drug therapy: Secondary | ICD-10-CM

## 2024-02-17 DIAGNOSIS — R197 Diarrhea, unspecified: Secondary | ICD-10-CM | POA: Diagnosis present

## 2024-02-17 DIAGNOSIS — E870 Hyperosmolality and hypernatremia: Secondary | ICD-10-CM | POA: Diagnosis not present

## 2024-02-17 DIAGNOSIS — I499 Cardiac arrhythmia, unspecified: Secondary | ICD-10-CM | POA: Diagnosis not present

## 2024-02-17 DIAGNOSIS — Z8673 Personal history of transient ischemic attack (TIA), and cerebral infarction without residual deficits: Secondary | ICD-10-CM | POA: Diagnosis not present

## 2024-02-17 DIAGNOSIS — Z66 Do not resuscitate: Secondary | ICD-10-CM | POA: Diagnosis present

## 2024-02-17 DIAGNOSIS — I480 Paroxysmal atrial fibrillation: Secondary | ICD-10-CM | POA: Diagnosis present

## 2024-02-17 DIAGNOSIS — R110 Nausea: Secondary | ICD-10-CM | POA: Diagnosis not present

## 2024-02-17 DIAGNOSIS — Z882 Allergy status to sulfonamides status: Secondary | ICD-10-CM

## 2024-02-17 DIAGNOSIS — R319 Hematuria, unspecified: Secondary | ICD-10-CM | POA: Diagnosis not present

## 2024-02-17 LAB — TROPONIN I (HIGH SENSITIVITY)
Troponin I (High Sensitivity): 18 ng/L — ABNORMAL HIGH (ref <18)
Troponin I (High Sensitivity): 19 ng/L — ABNORMAL HIGH (ref <18)

## 2024-02-17 LAB — URINALYSIS, ROUTINE W REFLEX MICROSCOPIC
Bilirubin Urine: NEGATIVE
Glucose, UA: NEGATIVE mg/dL
Ketones, ur: NEGATIVE mg/dL
Nitrite: NEGATIVE
Protein, ur: NEGATIVE mg/dL
Specific Gravity, Urine: 1.019 (ref 1.005–1.030)
pH: 5 (ref 5.0–8.0)

## 2024-02-17 LAB — COMPREHENSIVE METABOLIC PANEL
ALT: 11 U/L (ref 0–44)
AST: 22 U/L (ref 15–41)
Albumin: 3.7 g/dL (ref 3.5–5.0)
Alkaline Phosphatase: 40 U/L (ref 38–126)
Anion gap: 8 (ref 5–15)
BUN: 26 mg/dL — ABNORMAL HIGH (ref 8–23)
CO2: 26 mmol/L (ref 22–32)
Calcium: 9.3 mg/dL (ref 8.9–10.3)
Chloride: 109 mmol/L (ref 98–111)
Creatinine, Ser: 0.7 mg/dL (ref 0.44–1.00)
GFR, Estimated: >60 mL/min (ref >60)
Glucose, Bld: 108 mg/dL — ABNORMAL HIGH (ref 70–99)
Potassium: 4.7 mmol/L (ref 3.5–5.1)
Sodium: 143 mmol/L (ref 135–145)
Total Bilirubin: 1.8 mg/dL — ABNORMAL HIGH (ref 0.0–1.2)
Total Protein: 7.1 g/dL (ref 6.5–8.1)

## 2024-02-17 LAB — CBC
HCT: 46.9 % — ABNORMAL HIGH (ref 36.0–46.0)
Hemoglobin: 14.9 g/dL (ref 12.0–15.0)
MCH: 31.6 pg (ref 26.0–34.0)
MCHC: 31.8 g/dL (ref 30.0–36.0)
MCV: 99.4 fL (ref 80.0–100.0)
Platelets: 145 10*3/uL — ABNORMAL LOW (ref 150–400)
RBC: 4.72 MIL/uL (ref 3.87–5.11)
RDW: 12.9 % (ref 11.5–15.5)
WBC: 5.1 10*3/uL (ref 4.0–10.5)
nRBC: 0 % (ref 0.0–0.2)

## 2024-02-17 LAB — I-STAT CHEM 8, ED
BUN: 23 mg/dL (ref 8–23)
Calcium, Ion: 1.08 mmol/L — ABNORMAL LOW (ref 1.15–1.40)
Chloride: 114 mmol/L — ABNORMAL HIGH (ref 98–111)
Creatinine, Ser: 0.8 mg/dL (ref 0.44–1.00)
Glucose, Bld: 94 mg/dL (ref 70–99)
HCT: 35 % — ABNORMAL LOW (ref 36.0–46.0)
Hemoglobin: 11.9 g/dL — ABNORMAL LOW (ref 12.0–15.0)
Potassium: 3.4 mmol/L — ABNORMAL LOW (ref 3.5–5.1)
Sodium: 146 mmol/L — ABNORMAL HIGH (ref 135–145)
TCO2: 22 mmol/L (ref 22–32)

## 2024-02-17 LAB — CBG MONITORING, ED: Glucose-Capillary: 74 mg/dL (ref 70–99)

## 2024-02-17 MED ORDER — ALPRAZOLAM 0.25 MG PO TABS: 0.2500 mg | ORAL_TABLET | Freq: Every evening | ORAL | Status: DC | PRN

## 2024-02-17 MED ORDER — CEFTRIAXONE SODIUM 1 G IJ SOLR
1.0000 g | Freq: Once | INTRAMUSCULAR | Status: AC
  Administered 2024-02-17: 1 g via INTRAVENOUS
  Filled 2024-02-17: qty 10

## 2024-02-17 MED ORDER — SODIUM CHLORIDE 0.9 % IV SOLN
1.0000 g | INTRAVENOUS | Status: AC
  Administered 2024-02-18 – 2024-02-19 (×2): 1 g via INTRAVENOUS
  Filled 2024-02-17 (×2): qty 10

## 2024-02-17 MED ORDER — ALPRAZOLAM 0.25 MG PO TABS
0.2500 mg | ORAL_TABLET | Freq: Every evening | ORAL | Status: DC | PRN
  Administered 2024-02-18: 0.5 mg via ORAL
  Filled 2024-02-17: qty 2

## 2024-02-17 MED ORDER — POLYETHYLENE GLYCOL 3350 17 G PO PACK: 17.0000 g | PACK | Freq: Every day | ORAL | Status: DC | PRN

## 2024-02-17 MED ORDER — APIXABAN 2.5 MG PO TABS
2.5000 mg | ORAL_TABLET | Freq: Two times a day (BID) | ORAL | Status: DC
  Administered 2024-02-17 – 2024-02-19 (×4): 2.5 mg via ORAL
  Filled 2024-02-17 (×4): qty 1

## 2024-02-17 MED ORDER — ACETAMINOPHEN 325 MG PO TABS
650.0000 mg | ORAL_TABLET | Freq: Four times a day (QID) | ORAL | Status: DC | PRN
  Administered 2024-02-19: 650 mg via ORAL
  Filled 2024-02-17: qty 2

## 2024-02-17 MED ORDER — LACTATED RINGERS IV BOLUS
1000.0000 mL | Freq: Once | INTRAVENOUS | Status: AC
  Administered 2024-02-17: 1000 mL via INTRAVENOUS

## 2024-02-17 MED ORDER — SODIUM CHLORIDE 0.9% FLUSH
3.0000 mL | Freq: Two times a day (BID) | INTRAVENOUS | Status: DC
  Administered 2024-02-17 – 2024-02-18 (×3): 3 mL via INTRAVENOUS

## 2024-02-17 MED ORDER — SIMVASTATIN 20 MG PO TABS
20.0000 mg | ORAL_TABLET | Freq: Every day | ORAL | Status: DC
  Administered 2024-02-17 – 2024-02-19 (×3): 20 mg via ORAL
  Filled 2024-02-17 (×3): qty 1

## 2024-02-17 MED ORDER — LACTATED RINGERS IV BOLUS
500.0000 mL | Freq: Once | INTRAVENOUS | Status: AC
  Administered 2024-02-17: 500 mL via INTRAVENOUS

## 2024-02-17 MED ORDER — ACETAMINOPHEN 650 MG RE SUPP: 650.0000 mg | Freq: Four times a day (QID) | RECTAL | Status: DC | PRN

## 2024-02-17 MED ORDER — LEVOTHYROXINE SODIUM 75 MCG PO TABS
75.0000 ug | ORAL_TABLET | Freq: Every day | ORAL | Status: DC
  Administered 2024-02-18 – 2024-02-19 (×2): 75 ug via ORAL
  Filled 2024-02-17 (×2): qty 1

## 2024-02-17 NOTE — ED Notes (Signed)
 ..ED TO INPATIENT HANDOFF REPORT  Name/Age/Gender Anne [name] 89 y.o. female  Code Status    Code Status Orders  (From admission, onward)           Start     Ordered   02/17/24 2015  Do not attempt resuscitation (DNR) Pre-Arrest Interventions Desired  Continuous       Question Answer Comment  If pulseless and not breathing No CPR or chest compressions.   In Pre-Arrest Conditions (Patient Has Pulse and Is Breathing) May intubate, use advanced airway interventions and cardioversion/ACLS medications if appropriate or indicated. May transfer to ICU.   Consent: Discussion documented in EHR or advanced directives reviewed      02/17/24 2016           Code Status History     Date Active Date Inactive Code Status Order ID Comments User Context   08/28/2021 1753 09/03/2021 2353 Full Code [id number]  Kara Mead, NP ED   12/19/2013 1548 12/25/2013 1403 Full Code [id number]  Ovidio Kin, MD Inpatient   12/30/2011 1436 12/31/2011 1404 Full Code [id number]  Sandria Senter, RN Inpatient      Advance Directive Documentation    Flowsheet Row Most Recent Value  Type of Advance Directive Out of facility DNR (pink MOST or yellow form)  Pre-existing out of facility DNR order (yellow form or pink MOST form) --  "MOST" Form in Place? --       Home/SNF/Other Skilled nursing facility  Chief Complaint Hypernatremia [E87.0]  Level of Care/Admitting Diagnosis ED Disposition     ED Disposition  Admit   Condition  --   Comment  Hospital Area: [facility] [[id number]]  Level of Care: Med-Surg [16]  May admit patient to [facility] or [facility] if equivalent level of care is available:: No  Covid Evaluation: Asymptomatic - no recent exposure (last 10 days) testing not required  Diagnosis: Hypernatremia [308657]  Admitting Physician: Nolberto Hanlon [[id number]]  Attending Physician: Nolberto Hanlon [[id number]]  Certification:: I certify this patient will need inpatient  services for at least 2 midnights  Expected Medical Readiness: 02/19/2024          Medical History Past Medical History:  Diagnosis Date   Abdominal distension    Abdominal pain    Anxiety    facial twitch and click , twitch lt shoulder  due to anxiety   Arthritis    right first finger   Chronic kidney disease    hx uti 1 mo ago was med tx    Constipation    Depression    GERD (gastroesophageal reflux disease)    Hypothyroidism    Itching    at times, area varies   No pertinent past medical history    OCC PAINS TO ABDOMEN AND CHEST , DR BYERLY PLACED PATIENT ON MED FOR GERD FOR 2 WEEKS   Thyroid disease     Allergies Allergies  Allergen Reactions   Sulfa Antibiotics Rash and Other (See Comments)    IV Location/Drains/Wounds Patient Lines/Drains/Airways Status     Active Line/Drains/Airways     Name Placement date Placement time Site Days   Peripheral IV 02/17/24 20 G Anterior;Left;Proximal Forearm 02/17/24  1505  Forearm  less than 1            Labs/Imaging Results for orders placed or performed during the hospital encounter of 02/17/24 (from the past 48 hours)  CBC     Status: Abnormal   Collection Time:  02/17/24  3:11 PM  Result Value Ref Range   WBC 5.1 4.0 - 10.5 K/uL   RBC 4.72 3.87 - 5.11 MIL/uL   Hemoglobin 14.9 12.0 - 15.0 g/dL   HCT 16.1 (H) 09.6 - 04.5 %   MCV 99.4 80.0 - 100.0 fL   MCH 31.6 26.0 - 34.0 pg   MCHC 31.8 30.0 - 36.0 g/dL   RDW 40.9 81.1 - 91.4 %   Platelets 145 (L) 150 - 400 K/uL   nRBC 0.0 0.0 - 0.2 %    Comment: Performed at [facility], [address] [place], [place] [address]  Comprehensive metabolic panel     Status: Abnormal   Collection Time: 02/17/24  3:11 PM  Result Value Ref Range   Sodium 143 135 - 145 mmol/L   Potassium 4.7 3.5 - 5.1 mmol/L    Comment: HEMOLYSIS AT THIS LEVEL MAY AFFECT RESULT SLIGHT HEMOLYSIS PRESENT    Chloride 109 98 - 111 mmol/L   CO2 26 22 - 32 mmol/L   Glucose,  Bld 108 (H) 70 - 99 mg/dL    Comment: Glucose reference range applies only to samples taken after fasting for at least 8 hours.   BUN 26 (H) 8 - 23 mg/dL   Creatinine, Ser 6.21 0.44 - 1.00 mg/dL   Calcium 9.3 8.9 - 30.8 mg/dL   Total Protein 7.1 6.5 - 8.1 g/dL   Albumin 3.7 3.5 - 5.0 g/dL   AST 22 15 - 41 U/L    Comment: HEMOLYSIS AT THIS LEVEL MAY AFFECT RESULT SLIGHT HEMOLYSIS PRESENT    ALT 11 0 - 44 U/L    Comment: HEMOLYSIS AT THIS LEVEL MAY AFFECT RESULT SLIGHT HEMOLYSIS PRESENT    Alkaline Phosphatase 40 38 - 126 U/L   Total Bilirubin 1.8 (H) 0.0 - 1.2 mg/dL    Comment: HEMOLYSIS AT THIS LEVEL MAY AFFECT RESULT SLIGHT HEMOLYSIS PRESENT    GFR, Estimated >60 >60 mL/min    Comment: (NOTE) Calculated using the CKD-EPI Creatinine Equation (2021)    Anion gap 8 5 - 15    Comment: Performed at [facility], [address], [place], [place] [address]  Troponin I (High Sensitivity)     Status: Abnormal   Collection Time: 02/17/24  3:11 PM  Result Value Ref Range   Troponin I (High Sensitivity) 18 (H) <18 ng/L    Comment: (NOTE) Elevated high sensitivity troponin I (hsTnI) values and significant  changes across serial measurements may suggest ACS but many other  chronic and acute conditions are known to elevate hsTnI results.  Refer to the "Links" section for chest pain algorithms and additional  guidance. Performed at [facility], [address], [place], [place] [address]   POC CBG, ED     Status: None   Collection Time: 02/17/24  3:21 PM  Result Value Ref Range   Glucose-Capillary 74 70 - 99 mg/dL    Comment: Glucose reference range applies only to samples taken after fasting for at least 8 hours.  I-stat chem 8, ED (not at [facility], [facility] or [facility])     Status: Abnormal   Collection Time: 02/17/24  3:32 PM  Result Value Ref Range   Sodium 146 (H) 135 - 145 mmol/L   Potassium 3.4 (L) 3.5 - 5.1 mmol/L   Chloride 114 (H) 98 - 111 mmol/L    BUN 23 8 - 23 mg/dL   Creatinine, Ser 5.28 0.44 - 1.00 mg/dL  Glucose, Bld 94 70 - 99 mg/dL    Comment: Glucose reference range applies only to samples taken after fasting for at least 8 hours.   Calcium, Ion 1.08 (L) 1.15 - 1.40 mmol/L   TCO2 22 22 - 32 mmol/L   Hemoglobin 11.9 (L) 12.0 - 15.0 g/dL   HCT 16.1 (L) 09.6 - 04.5 %  Urinalysis, Routine w reflex microscopic -Urine, Clean Catch     Status: Abnormal   Collection Time: 02/17/24  3:59 PM  Result Value Ref Range   Color, Urine YELLOW YELLOW   APPearance CLOUDY (A) CLEAR   Specific Gravity, Urine 1.019 1.005 - 1.030   pH 5.0 5.0 - 8.0   Glucose, UA NEGATIVE NEGATIVE mg/dL   Hgb urine dipstick MODERATE (A) NEGATIVE   Bilirubin Urine NEGATIVE NEGATIVE   Ketones, ur NEGATIVE NEGATIVE mg/dL   Protein, ur NEGATIVE NEGATIVE mg/dL   Nitrite NEGATIVE NEGATIVE   Leukocytes,Ua SMALL (A) NEGATIVE   RBC / HPF 0-5 0 - 5 RBC/hpf   WBC, UA 21-50 0 - 5 WBC/hpf   Bacteria, UA MANY (A) NONE SEEN   Squamous Epithelial / HPF 0-5 0 - 5 /HPF   Mucus PRESENT    Hyaline Casts, UA PRESENT     Comment: Performed at [facility], [address] [place], [place] [address]  Troponin I (High Sensitivity)     Status: Abnormal   Collection Time: 02/17/24  4:27 PM  Result Value Ref Range   Troponin I (High Sensitivity) 19 (H) <18 ng/L    Comment: (NOTE) Elevated high sensitivity troponin I (hsTnI) values and significant  changes across serial measurements may suggest ACS but many other  chronic and acute conditions are known to elevate hsTnI results.  Refer to the "Links" section for chest pain algorithms and additional  guidance. Performed at [facility], [address], [place], [place] [address]    DG Chest 2 View Result Date: 02/17/2024 CLINICAL DATA:  Weakness EXAM: CHEST - 2 VIEW COMPARISON:  None Available. FINDINGS: No consolidation, pneumothorax or effusion. No edema. Enlarged cardiopericardial  silhouette calcified aorta. Calcified aorta. Overlapping cardiac leads. IMPRESSION: No acute cardiopulmonary disease. Electronically Signed   By: Karen Kays M.D.   On: 02/17/2024 15:30    Pending Labs Unresulted Labs (From admission, onward)     Start     Ordered   02/18/24 0500  APTT  Tomorrow morning,   R        02/17/24 2016   02/18/24 0500  Protime-INR  Tomorrow morning,   R        02/17/24 2016   02/18/24 0500  Vitamin B12  Tomorrow morning,   R        02/17/24 2016   02/18/24 0500  TSH  Tomorrow morning,   R        02/17/24 2016   02/18/24 0500  RPR  Tomorrow morning,   R        02/17/24 2016   02/18/24 0500  Basic metabolic panel  Tomorrow morning,   R        02/17/24 2016   02/18/24 0500  CBC  Tomorrow morning,   R        02/17/24 2016            Vitals/Pain Today's Vitals   02/17/24 1550 02/17/24 1800 02/17/24 1945 02/17/24 1949  BP: (!) 155/85 (!) 154/72 (!) 159/72 119/63  Pulse: 70 (!) 44 (!) 56 98  Resp: 18 18 18 20   Temp:   98.6 F (37 C) 98.5 F (36.9 C)  TempSrc:   Oral Oral  SpO2: 99% 95% 97% 100%    Isolation Precautions No active isolations  Medications Medications  simvastatin (ZOCOR) tablet 20 mg (has no administration in time range)  levothyroxine (SYNTHROID) tablet 75 mcg (has no administration in time range)  acetaminophen (TYLENOL) tablet 650 mg (has no administration in time range)    Or  acetaminophen (TYLENOL) suppository 650 mg (has no administration in time range)  polyethylene glycol (MIRALAX / GLYCOLAX) packet 17 g (has no administration in time range)  sodium chloride flush (NS) 0.9 % injection 3 mL (has no administration in time range)  apixaban (ELIQUIS) tablet 2.5 mg (has no administration in time range)  ALPRAZolam (XANAX) tablet 0.25-0.5 mg (has no administration in time range)  lactated ringers bolus 1,000 mL (has no administration in time range)  cefTRIAXone (ROCEPHIN) 1 g in sodium chloride 0.9 % 100 mL IVPB (has no  administration in time range)  lactated ringers bolus 500 mL (0 mLs Intravenous Stopped 02/17/24 2022)  cefTRIAXone (ROCEPHIN) 1 g in sodium chloride 0.9 % 100 mL IVPB (0 g Intravenous Stopped 02/17/24 2022)    Mobility walks with person assist

## 2024-02-17 NOTE — Assessment & Plan Note (Signed)
 Diagnosed in the ER, s/p ceftriaxone 1 g.  I will give 2 more doses tomorrow.  Cultures would not be helpful as patient does not have sepsis at this time and she is already gotten 1 dose of IV antibiotic.

## 2024-02-17 NOTE — Assessment & Plan Note (Signed)
 C/w apixaban.

## 2024-02-17 NOTE — H&P (Addendum)
 History and Physical    Patient: Anne [name] ZHY:[id number] DOB: 10/07/1929 DOA: 02/17/2024 DOS: the patient was seen and examined on 02/17/2024 PCP: Irena Reichmann, DO  Patient coming from: Home  Chief Complaint:  Chief Complaint  Patient presents with   Weakness  History is obtained principally from patient's daughter and son-in-law at the bedside.  As well as from patient's husband over the telephone. HPI: Anne [name] is a 89 y.o. female with medical history significant of chronic cognitive deficit.  Therefore patient is not able to meaningfully contribute to the history of presenting illness.  Patient at baseline has poor memory.  Patient does not cook and cannot reasonably function unsupervised.  That said patient is generally able to get up walk around change clothes.  She gets confused about time.  Sore dates etc.  But she anticipates people visiting etc.  And she is verbose and talkative.  Patient has previously had a stroke.  With no residual focal motor deficit.  Patient does have limited functional capacity.  Patient walks upstairs slowly to sleep in the bedroom upstairs.  Patient was in her usual state of health till around 4 days ago.  When it was noticed that the patient stopped changing her clothes and has been on the same pair of clothes for the last 4 days.  Patient has also not gone upstairs anymore and is staying downstairs sleeping on the recliner.  Patient is also noted to have poor appetite as reported by patient's husband and daughter.  With reduced p.o. intake of food and fluids.  Patient husband reported that the patient has had on and off diarrhea of approximately 7 or 10 days.  Last episode seems to have been around 48 hours ago.  There is no report of patient having any fever.  There is no new abdominal pain.  Patient does have chronic lower abdominal discomfort which has been attributed to chronic UTI.  There is definitely no vomiting or trauma reported.  No  falls.  Patient kept on sleeping pretty late this morning and would not get up for the husband, prompting the patient to be brought to the ER.  Here in the ER patient is vitally stable noted to be confused, mild hypernatremia patient is found to have urinary tract infection.  Medical evaluation is sought. S/p ceftrixone and 500 cc LR bolus.  Patient offers no complaints at this time  Review of Systems: As mentioned in the history of present illness. All other systems reviewed and are negative. Past Medical History:  Diagnosis Date   Abdominal distension    Abdominal pain    Anxiety    facial twitch and click , twitch lt shoulder  due to anxiety   Arthritis    right first finger   Chronic kidney disease    hx uti 1 mo ago was med tx    Constipation    Depression    GERD (gastroesophageal reflux disease)    Hypothyroidism    Itching    at times, area varies   No pertinent past medical history    OCC PAINS TO ABDOMEN AND CHEST , DR BYERLY PLACED PATIENT ON MED FOR GERD FOR 2 WEEKS   Thyroid disease    Past Surgical History:  Procedure Laterality Date   ABDOMINAL HYSTERECTOMY     partial - patient does not remember date   APPENDECTOMY  1948   bladder tack     Patient does not remember date   CHOLECYSTECTOMY  12/30/2011   Procedure: LAPAROSCOPIC CHOLECYSTECTOMY WITH INTRAOPERATIVE CHOLANGIOGRAM;  Surgeon: Almond Lint, MD;  Location: [facility] OR;  Service: General;  Laterality: N/A;   colonscopy  01-12-2014   LAPAROSCOPIC RIGHT HEMI COLECTOMY Right 12/19/2013   Procedure: LAPAROSCOPIC RIGHT HEMI COLECTOMY;  Surgeon: Kandis Cocking, MD;  Location: [facility];  Service: General;  Laterality: Right;   NOSE SURGERY     1970   vericose vein removal     legs. does not remember date of procedure   Social History:  reports that she has never smoked. She has never used smokeless tobacco. She reports that she does not drink alcohol and does not use drugs.  Allergies  Allergen Reactions   Sulfa  Antibiotics Rash and Other (See Comments)    Family History  Problem Relation Age of Onset   Stroke Mother    Cancer Sister        breast   Cancer Brother        lung and thyroid    Prior to Admission medications   Medication Sig Start Date End Date Taking? Authorizing Provider  ALPRAZolam Prudy Feeler) 0.25 MG tablet Take by mouth daily. 1 - 2 tabs prn   Yes [provider]  Cholecalciferol (VITAMIN D3) 1000 UNITS CAPS Take 1,000 Units by mouth daily.    Yes [provider]  ELIQUIS 2.5 MG TABS tablet TAKE 1 TABLET(2.5 MG) BY MOUTH TWICE DAILY 12/06/23  Yes Duke Salvia, MD  levothyroxine (SYNTHROID) 75 MCG tablet Take 75 mcg by mouth daily before breakfast.   Yes [provider]  polyethylene glycol (MIRALAX / GLYCOLAX) packet Take 17 g by mouth daily as needed for mild constipation (constipation).   Yes [provider]  simvastatin (ZOCOR) 20 MG tablet Take 20 mg by mouth daily. 10/02/21  Yes [provider]    Physical Exam: Vitals:   02/17/24 1550 02/17/24 1800 02/17/24 1945 02/17/24 1949  BP: (!) 155/85 (!) 154/72 (!) 159/72 119/63  Pulse: 70 (!) 44 (!) 56 98  Resp: 18 18 18 20   Temp:   98.6 F (37 C) 98.5 F (36.9 C)  TempSrc:   Oral Oral  SpO2: 99% 95% 97% 100%   General: Patient is alert and awake, verbose, chronically hard of hearing.  Patient has poor recollection/orientation because of her trouble hearing and/or memory.  Patient is very bad at reading lips as well. She was unable to tell that she has had direah. Respiratory exam: Bilateral intravesicular Cardiovascular exam S1-S2 normal Abdomen bowel sounds are normal all quadrants soft nontender.  No report of diarrhea here in the ER. Extremities warm without edema no focal motor deficit. Data Reviewed:  Labs on Admission:  Results for orders placed or performed during the hospital encounter of 02/17/24 (from the past 24 hours)  CBC     Status: Abnormal   Collection Time:  02/17/24  3:11 PM  Result Value Ref Range   WBC 5.1 4.0 - 10.5 K/uL   RBC 4.72 3.87 - 5.11 MIL/uL   Hemoglobin 14.9 12.0 - 15.0 g/dL   HCT 82.9 (H) 56.2 - 13.0 %   MCV 99.4 80.0 - 100.0 fL   MCH 31.6 26.0 - 34.0 pg   MCHC 31.8 30.0 - 36.0 g/dL   RDW 86.5 78.4 - 69.6 %   Platelets 145 (L) 150 - 400 K/uL   nRBC 0.0 0.0 - 0.2 %  Comprehensive metabolic panel     Status: Abnormal   Collection Time: 02/17/24  3:11 PM  Result Value Ref Range   Sodium 143 135 - 145 mmol/L   Potassium 4.7 3.5 - 5.1 mmol/L   Chloride 109 98 - 111 mmol/L   CO2 26 22 - 32 mmol/L   Glucose, Bld 108 (H) 70 - 99 mg/dL   BUN 26 (H) 8 - 23 mg/dL   Creatinine, Ser 1.61 0.44 - 1.00 mg/dL   Calcium 9.3 8.9 - 09.6 mg/dL   Total Protein 7.1 6.5 - 8.1 g/dL   Albumin 3.7 3.5 - 5.0 g/dL   AST 22 15 - 41 U/L   ALT 11 0 - 44 U/L   Alkaline Phosphatase 40 38 - 126 U/L   Total Bilirubin 1.8 (H) 0.0 - 1.2 mg/dL   GFR, Estimated >04 >54 mL/min   Anion gap 8 5 - 15  Troponin I (High Sensitivity)     Status: Abnormal   Collection Time: 02/17/24  3:11 PM  Result Value Ref Range   Troponin I (High Sensitivity) 18 (H) <18 ng/L  POC CBG, ED     Status: None   Collection Time: 02/17/24  3:21 PM  Result Value Ref Range   Glucose-Capillary 74 70 - 99 mg/dL  I-stat chem 8, ED (not at [facility], [facility] or [facility])     Status: Abnormal   Collection Time: 02/17/24  3:32 PM  Result Value Ref Range   Sodium 146 (H) 135 - 145 mmol/L   Potassium 3.4 (L) 3.5 - 5.1 mmol/L   Chloride 114 (H) 98 - 111 mmol/L   BUN 23 8 - 23 mg/dL   Creatinine, Ser 0.98 0.44 - 1.00 mg/dL   Glucose, Bld 94 70 - 99 mg/dL   Calcium, Ion 1.19 (L) 1.15 - 1.40 mmol/L   TCO2 22 22 - 32 mmol/L   Hemoglobin 11.9 (L) 12.0 - 15.0 g/dL   HCT 14.7 (L) 82.9 - 56.2 %  Urinalysis, Routine w reflex microscopic -Urine, Clean Catch     Status: Abnormal   Collection Time: 02/17/24  3:59 PM  Result Value Ref Range   Color, Urine YELLOW YELLOW   APPearance CLOUDY (A) CLEAR    Specific Gravity, Urine 1.019 1.005 - 1.030   pH 5.0 5.0 - 8.0   Glucose, UA NEGATIVE NEGATIVE mg/dL   Hgb urine dipstick MODERATE (A) NEGATIVE   Bilirubin Urine NEGATIVE NEGATIVE   Ketones, ur NEGATIVE NEGATIVE mg/dL   Protein, ur NEGATIVE NEGATIVE mg/dL   Nitrite NEGATIVE NEGATIVE   Leukocytes,Ua SMALL (A) NEGATIVE   RBC / HPF 0-5 0 - 5 RBC/hpf   WBC, UA 21-50 0 - 5 WBC/hpf   Bacteria, UA MANY (A) NONE SEEN   Squamous Epithelial / HPF 0-5 0 - 5 /HPF   Mucus PRESENT    Hyaline Casts, UA PRESENT    Basic Metabolic Panel: Recent Labs  Lab 02/17/24 1511 02/17/24 1532  NA 143 146*  K 4.7 3.4*  CL 109 114*  CO2 26  --   GLUCOSE 108* 94  BUN 26* 23  CREATININE 0.70 0.80  CALCIUM 9.3  --    Liver Function Tests: Recent Labs  Lab 02/17/24 1511  AST 22  ALT 11  ALKPHOS 40  BILITOT 1.8*  PROT 7.1  ALBUMIN 3.7   No results for input(s): "LIPASE", "AMYLASE" in the last 168 hours. No results for input(s): "AMMONIA" in the last 168 hours. CBC: Recent Labs  Lab 02/17/24 1511 02/17/24 1532  WBC 5.1  --   HGB 14.9 11.9*  HCT 46.9* 35.0*  MCV 99.4  --   PLT 145*  --    Cardiac Enzymes: Recent Labs  Lab 02/17/24 1511  TROPONINIHS 18*    BNP (last 3 results) No results for input(s): "PROBNP" in the last 8760 hours. CBG: Recent Labs  Lab 02/17/24 1521  GLUCAP 74    Radiological Exams on Admission:  DG Chest 2 View Result Date: 02/17/2024 CLINICAL DATA:  Weakness EXAM: CHEST - 2 VIEW COMPARISON:  None Available. FINDINGS: No consolidation, pneumothorax or effusion. No edema. Enlarged cardiopericardial silhouette calcified aorta. Calcified aorta. Overlapping cardiac leads. IMPRESSION: No acute cardiopulmonary disease. Electronically Signed   By: Karen Kays M.D.   On: 02/17/2024 15:30     No intake/output data recorded. No intake/output data recorded.      HCG    Assessment and Plan: * Hypernatremia Mild, patient husband gives a report of patient  having had diarrhea, which seems to have currently remitted.  I believe patient may have been slightly dehydrated causing associated metabolic encephalopathy.  I will treat the patient with additional 1 L of LR overnight, patient already s/p 500 cc of LR.  We will trend the sodium.  UTI (urinary tract infection) Diagnosed in the ER, s/p ceftriaxone 1 g.  I will give 2 more doses tomorrow.  Cultures would not be helpful as patient does not have sepsis at this time and she is already gotten 1 dose of IV antibiotic.  Acute metabolic encephalopathy See HPI, patient has had chronic /stroke related/dementia related encephalopathy.  There has been acute worsening in terms of functional status which seems to be generalized nonfocal.  And likely precipitated by her diarrhea-like illness and possible UTI as above.  I will request physical therapy evaluation in the morning and also nutrition evaluation given report of poor appetite.  And see response to hydration and diarrhea remitting. Check TSH RPR and B12. C.w. xanax for sleep  Cryptogenic stroke (HCC) Remote. Patinet reportedly has afib. C.w. apixaban  Paroxysmal atrial fibrillation (HCC) C.w. apixaban  Ischemic stroke (HCC) C.w. zocor      Advance Care Planning:   Code Status: Do not attempt resuscitation (DNR) PRE-ARREST INTERVENTIONS DESIRED discussed with family and GOLD form at bedsdie.  Consults: none  Family Communication: discussed at bedside.  Severity of Illness: The appropriate patient status for this patient is INPATIENT. Inpatient status is judged to be reasonable and necessary in order to provide the required intensity of service to ensure the patient's safety. The patient's presenting symptoms, physical exam findings, and initial radiographic and laboratory data in the context of their chronic comorbidities is felt to place them at high risk for further clinical deterioration. Furthermore, it is not anticipated that the patient  will be medically stable for discharge from the hospital within 2 midnights of admission.   * I certify that at the point of admission it is my clinical judgment that the patient will require inpatient hospital care spanning beyond 2 midnights from the point of admission due to high intensity of service, high risk for further deterioration and high frequency of surveillance required.*  Author: Nolberto Hanlon, MD 02/17/2024 8:16 PM  For on call review [email or web address].

## 2024-02-17 NOTE — Assessment & Plan Note (Signed)
 Mild, patient husband gives a report of patient having had diarrhea, which seems to have currently remitted.  I believe patient may have been slightly dehydrated causing associated metabolic encephalopathy.  I will treat the patient with additional 1 L of LR overnight, patient already s/p 500 cc of LR.  We will trend the sodium.

## 2024-02-17 NOTE — ED Triage Notes (Signed)
 Pt arrives via [facility] from home where she lives with her husband. Per daughter on scene pt has become increasingly weak and bound to her bed. Per EMS family has recently been having conversation about placing pt in hospice. Pt having increased weight loss and no appetite. On arrival pt oriented to person and place only. Denies somatic complaints.

## 2024-02-17 NOTE — ED Provider Notes (Signed)
 [place] EMERGENCY DEPARTMENT AT [facility] Provider Note   CSN: [id number] Arrival date & time: 02/17/24  1322     History {Add pertinent medical, surgical, social history, OB history to HPI:1} Chief Complaint  Patient presents with   Weakness    Anne [name] is a 89 y.o. female.  This is a 89 year old female presenting emergency department for generalized weakness.  She is alert to person place, not to time.  She has no specific complaints currently and does not specifically know why she is here in the emergency department.  Triage notes that she has had some progressive weakness with increased weight loss and decreased appetite.   Weakness      Home Medications Prior to Admission medications   Medication Sig Start Date End Date Taking? Authorizing Provider  ALPRAZolam Prudy Feeler) 0.25 MG tablet Take by mouth daily. 1 - 2 tabs prn   Yes [provider]  Cholecalciferol (VITAMIN D3) 1000 UNITS CAPS Take 1,000 Units by mouth daily.    Yes [provider]  ELIQUIS 2.5 MG TABS tablet TAKE 1 TABLET(2.5 MG) BY MOUTH TWICE DAILY 12/06/23  Yes Duke Salvia, MD  levothyroxine (SYNTHROID) 75 MCG tablet Take 75 mcg by mouth daily before breakfast.   Yes [provider]  polyethylene glycol (MIRALAX / GLYCOLAX) packet Take 17 g by mouth daily as needed for mild constipation (constipation).   Yes [provider]  simvastatin (ZOCOR) 20 MG tablet Take 20 mg by mouth daily. 10/02/21  Yes [provider]      Allergies    Sulfa antibiotics    Review of Systems   Review of Systems  Neurological:  Positive for weakness.    Physical Exam Updated Vital Signs BP (!) 183/86 (BP Location: Right Arm)   Pulse 61   Temp 98.8 F (37.1 C) (Oral)   Resp 12   SpO2 99%  Physical Exam Vitals and nursing note reviewed.  Constitutional:      General: She is not in acute distress.    Appearance: She is not toxic-appearing.  HENT:      Head: Normocephalic and atraumatic.     Nose: Nose normal.     Mouth/Throat:     Mouth: Mucous membranes are moist.  Eyes:     Conjunctiva/sclera: Conjunctivae normal.  Cardiovascular:     Rate and Rhythm: Normal rate and regular rhythm.  Pulmonary:     Effort: Pulmonary effort is normal.     Breath sounds: Normal breath sounds.  Abdominal:     General: Abdomen is flat. There is no distension.     Palpations: Abdomen is soft.     Tenderness: There is no abdominal tenderness. There is no guarding or rebound.  Musculoskeletal:     Right lower leg: No edema.     Left lower leg: No edema.  Skin:    General: Skin is warm and dry.     Capillary Refill: Capillary refill takes less than 2 seconds.  Neurological:     General: No focal deficit present.     Mental Status: She is alert. Mental status is at baseline.     Cranial Nerves: No cranial nerve deficit.     Sensory: No sensory deficit.     Motor: No weakness.     Coordination: Coordination normal.  Psychiatric:        Mood and Affect: Mood normal.        Behavior: Behavior normal.  ED Results / Procedures / Treatments   Labs (all labs ordered are listed, but only abnormal results are displayed) Labs Reviewed  CBC  COMPREHENSIVE METABOLIC PANEL  URINALYSIS, ROUTINE W REFLEX MICROSCOPIC  CBG MONITORING, ED  I-STAT CHEM 8, ED  TROPONIN I (HIGH SENSITIVITY)    EKG EKG Interpretation Date/Time:  Thursday February 17 2024 13:37:02 EDT Ventricular Rate:  59 PR Interval:    QRS Duration:  79 QT Interval:  419 QTC Calculation: 415 R Axis:   -24  Text Interpretation: Sinus rhythm Borderline left axis deviation Probable anterior infarct, age indeterminate Confirmed by Estanislado Pandy [phone]) on 02/17/2024 2:30:31 PM  Radiology No results found.  Procedures Procedures  {Document cardiac monitor, telemetry assessment procedure when appropriate:1}  Medications Ordered in ED Medications - No data to display  ED Course/  Medical Decision Making/ A&P Clinical Course as of 02/17/24 1459  Thu Feb 17, 2024  1353 Talk to patient's son-in-law who did not specifically know why she was in the emergency department.  He does note that she has some waxing and waning confusion has been ongoing for some time.  Came in number of his wife [phone] to call as she was with mother.  Attempted to call, no answer. [TY]  1419 Patient daughter presented to bedside.  Reported that her mother lives with her 31 year old father who is having difficulty caring for her.  She has had some waxing and waning mentation and hallucinations since her stroke couple years ago.  Today was reportedly lethargic and difficult to wake.  Supposedly daughter took pulse and said heart rate was in the 40s.  She states that her mother seemingly is back to her baseline.  Confirmed DNR status.  They are trying to place her into facility, but notes that if workup today negative they do feel comfortable taking patient home. [TY]    Clinical Course User Index [TY] Coral Spikes, DO   {   Click here for ABCD2, HEART and other calculatorsREFRESH Note before signing :1}                              Medical Decision Making This is a 89 year old female presenting emergency department for generalized weakness.  She is a DNR.  She is afebrile nontachycardic, is hypertensive.  Unclear if patient symptoms are acute on chronic or acute.  Attempted to call family without success.  EKG appears to be sinus rhythm without ST segment changes to indicate ischemia.  Not having chest pain or shortness of breath.  Benign abdominal exam.  No localizing deficits as well.  Will get screening labs and attempt to reach back out to family.  Amount and/or Complexity of Data Reviewed Independent Historian:     Details: See ED course External Data Reviewed:     Details: Appears she was referred on 2/27 to palliative. On Eliquise.  Labs: ordered. Decision-making details documented in ED  Course. Radiology: ordered and independent interpretation performed. Decision-making details documented in ED Course. ECG/medicine tests: ordered and independent interpretation performed.    Details: See above  Risk Decision regarding hospitalization.   {Document critical care time when appropriate:1} {Document review of labs and clinical decision tools ie heart score, Chads2Vasc2 etc:1}  {Document your independent review of radiology images, and any outside records:1} {Document your discussion with family members, caretakers, and with consultants:1} {Document social determinants of health affecting pt's care:1} {Document your decision making why or why not  admission, treatments were needed:1} Final Clinical Impression(s) / ED Diagnoses Final diagnoses:  None    Rx / DC Orders ED Discharge Orders     None

## 2024-02-17 NOTE — ED Provider Notes (Signed)
  Physical Exam  BP (!) 155/85   Pulse 70   Temp 98.8 F (37.1 C) (Oral)   Resp 18   SpO2 99%   Physical Exam  Procedures  Procedures  ED Course / MDM   Clinical Course as of 02/17/24 1813  Thu Feb 17, 2024  1353 Talk to patient's son-in-law who did not specifically know why she was in the emergency department.  He does note that she has some waxing and waning confusion has been ongoing for some time.  Came in number of his wife [phone] to call as she was with mother.  Attempted to call, no answer. [TY]  1419 Patient daughter presented to bedside.  Reported that her mother lives with her 79 year old father who is having difficulty caring for her.  She has had some waxing and waning mentation and hallucinations since her stroke couple years ago.  Today was reportedly lethargic and difficult to wake.  Supposedly daughter took pulse and said heart rate was in the 40s.  She states that her mother seemingly is back to her baseline.  Confirmed DNR status.  They are trying to place her into facility, but notes that if workup today negative they do feel comfortable taking patient home. [TY]    Clinical Course User Index [TY] Coral Spikes, DO   Medical Decision Making Amount and/or Complexity of Data Reviewed Labs: ordered. Radiology: ordered. ECG/medicine tests: ordered.   Received in signout.  Generalized weakness.  Delay in urinalysis and blood work however does have a hyponatremia likely from dehydration/decreased oral intake.  Found to have UTI.  Has not been eating and drinking much and has had more weakness at home.  I feel patient would benefit from admission to the hospital for treatment of the infection and potential placement decisions.       Benjiman Core, MD 02/17/24 [phone]

## 2024-02-17 NOTE — Plan of Care (Signed)

## 2024-02-17 NOTE — Assessment & Plan Note (Signed)
 C.w. zocor

## 2024-02-17 NOTE — Assessment & Plan Note (Signed)
 Remote. Patinet reportedly has afib. C.w. apixaban

## 2024-02-17 NOTE — ED Notes (Signed)
 Pt sitting up in bed eating sandwich with family at bedside.

## 2024-02-17 NOTE — Assessment & Plan Note (Addendum)
 See HPI, patient has had chronic /stroke related/dementia related encephalopathy.  There has been acute worsening in terms of functional status which seems to be generalized nonfocal.  And likely precipitated by her diarrhea-like illness and possible UTI as above.  I will request physical therapy evaluation in the morning and also nutrition evaluation given report of poor appetite.  And see response to hydration and diarrhea remitting. Check TSH RPR and B12. C.w. xanax for sleep

## 2024-02-18 ENCOUNTER — Other Ambulatory Visit: Payer: Self-pay

## 2024-02-18 DIAGNOSIS — I639 Cerebral infarction, unspecified: Secondary | ICD-10-CM

## 2024-02-18 DIAGNOSIS — G9341 Metabolic encephalopathy: Secondary | ICD-10-CM

## 2024-02-18 DIAGNOSIS — R319 Hematuria, unspecified: Secondary | ICD-10-CM

## 2024-02-18 DIAGNOSIS — N390 Urinary tract infection, site not specified: Secondary | ICD-10-CM

## 2024-02-18 DIAGNOSIS — E440 Moderate protein-calorie malnutrition: Secondary | ICD-10-CM

## 2024-02-18 DIAGNOSIS — E870 Hyperosmolality and hypernatremia: Secondary | ICD-10-CM | POA: Diagnosis not present

## 2024-02-18 LAB — PROTIME-INR
INR: 1.5 — ABNORMAL HIGH (ref 0.8–1.2)
Prothrombin Time: 18.6 s — ABNORMAL HIGH (ref 11.4–15.2)

## 2024-02-18 LAB — CBC
HCT: 45.3 % (ref 36.0–46.0)
Hemoglobin: 14.3 g/dL (ref 12.0–15.0)
MCH: 31.4 pg (ref 26.0–34.0)
MCHC: 31.6 g/dL (ref 30.0–36.0)
MCV: 99.3 fL (ref 80.0–100.0)
Platelets: 136 10*3/uL — ABNORMAL LOW (ref 150–400)
RBC: 4.56 MIL/uL (ref 3.87–5.11)
RDW: 12.6 % (ref 11.5–15.5)
WBC: 5.8 10*3/uL (ref 4.0–10.5)
nRBC: 0 % (ref 0.0–0.2)

## 2024-02-18 LAB — BASIC METABOLIC PANEL
Anion gap: 7 (ref 5–15)
BUN: 22 mg/dL (ref 8–23)
CO2: 25 mmol/L (ref 22–32)
Calcium: 8.7 mg/dL — ABNORMAL LOW (ref 8.9–10.3)
Chloride: 109 mmol/L (ref 98–111)
Creatinine, Ser: 0.77 mg/dL (ref 0.44–1.00)
GFR, Estimated: >60 mL/min (ref >60)
Glucose, Bld: 109 mg/dL — ABNORMAL HIGH (ref 70–99)
Potassium: 3.5 mmol/L (ref 3.5–5.1)
Sodium: 141 mmol/L (ref 135–145)

## 2024-02-18 LAB — APTT: aPTT: 31 s (ref 24–36)

## 2024-02-18 LAB — VITAMIN B12: Vitamin B-12: 258 pg/mL (ref 180–914)

## 2024-02-18 LAB — RPR: RPR Ser Ql: NONREACTIVE

## 2024-02-18 LAB — TSH: TSH: 0.986 u[IU]/mL (ref 0.350–4.500)

## 2024-02-18 MED ORDER — INFLUENZA VAC A&B SURF ANT ADJ 0.5 ML IM SUSY
0.5000 mL | PREFILLED_SYRINGE | INTRAMUSCULAR | Status: DC
  Filled 2024-02-18: qty 0.5

## 2024-02-18 MED ORDER — ENSURE ENLIVE PO LIQD
237.0000 mL | Freq: Two times a day (BID) | ORAL | Status: DC
  Administered 2024-02-19 (×2): 237 mL via ORAL

## 2024-02-18 MED ORDER — ADULT MULTIVITAMIN W/MINERALS CH
1.0000 | ORAL_TABLET | Freq: Every day | ORAL | Status: DC
  Administered 2024-02-19: 1 via ORAL
  Filled 2024-02-18: qty 1

## 2024-02-18 NOTE — Progress Notes (Signed)
 [facility]                                                                               Anne [name], is a 89 y.o. female, DOB - 03/24/1929, XBJ:[id number] Admit date - 02/17/2024    Outpatient Primary MD for the patient is Irena Reichmann, DO  LOS - 1  days    Brief summary    Anne [name] is a 89 y.o. female with medical history significant of chronic cognitive deficit, depression, hypothyroidism, GERD, anxiety, presents to ED with generalized weakness and confusion.    Assessment & Plan    Assessment and Plan:   * Hypernatremia Possibly from dehydration and free water deficit.  Resolved with IV fluids.  No diarrhea in the last 2 days.   UTI (urinary tract infection) Continue with IV ceftriaxone.  Unfortunately urine cultures were not sent on admission.  Complete 3 days of IV ceftriaxone.   Acute metabolic encephalopathy In the setting of dementia.  Probably from UTI and hypernatremia.    Cryptogenic stroke (HCC) Remote Continue with eliquis.   Paroxysmal atrial fibrillation (HCC) Rate controlled.    Ischemic stroke (HCC) Continue with zocor.     Hypothyroidism:  Resume synthroid 75 mcg daily.    Moderate Malnutrition Dietary will be consulted.    RN Pressure Injury Documentation:    Malnutrition Type:  Nutrition Problem: Moderate Malnutrition Etiology: chronic illness   Malnutrition Characteristics:  Signs/Symptoms: mild fat depletion, moderate muscle depletion   Nutrition Interventions:  Interventions: Refer to RD note for recommendations, Ensure Enlive (each supplement provides 350kcal and 20 grams of protein), Magic cup, MVI  Estimated body mass index is 20.06 kg/m as calculated from the following:   Height as of this encounter: 5\' 4"  (1.626 m).   Weight as of this encounter: 53 kg.  Code Status: DNR DVT Prophylaxis:  apixaban (ELIQUIS) tablet 2.5 mg Start: 02/17/24 2200 SCDs Start: 02/17/24  2015 apixaban (ELIQUIS) tablet 2.5 mg   Level of Care: Level of care: Med-Surg Family Communication: Updated patient's daughter at bedside.   Disposition Plan:     Remains inpatient appropriate:  pending.   Procedures:  None.   Consultants:   None.   Antimicrobials:   Anti-infectives (From admission, onward)    Start     Dose/Rate Route Frequency Ordered Stop   02/18/24 1000  cefTRIAXone (ROCEPHIN) 1 g in sodium chloride 0.9 % 100 mL IVPB        1 g 200 mL/hr over 30 Minutes Intravenous Every 24 hours 02/17/24 2025 02/20/24 0959   02/17/24 1815  cefTRIAXone (ROCEPHIN) 1 g in sodium chloride 0.9 % 100 mL IVPB        1 g 200 mL/hr over 30 Minutes Intravenous  Once 02/17/24 1812 02/17/24 2022        Medications  Scheduled Meds:  apixaban  2.5 mg Oral BID   feeding supplement  237 mL Oral BID BM   levothyroxine  75 mcg Oral QAC breakfast   [START ON 02/19/2024] multivitamin with minerals  1 tablet Oral Daily   simvastatin  20 mg Oral Daily   sodium chloride flush  3 mL Intravenous  Q12H   Continuous Infusions:  cefTRIAXone (ROCEPHIN)  IV 1 g (02/18/24 0859)   PRN Meds:.acetaminophen **OR** acetaminophen, ALPRAZolam, polyethylene glycol    Subjective:   Clora Ohmer was seen and examined today.    Objective:   Vitals:   02/17/24 2309 02/18/24 0136 02/18/24 0641 02/18/24 1202  BP: (!) 155/85 125/81 (!) 163/72 (!) 151/73  Pulse: 61  (!) 46 (!) 48  Resp: 16 18 18 18   Temp: (!) 97.5 F (36.4 C) 98.1 F (36.7 C) 98.1 F (36.7 C) 98 F (36.7 C)  TempSrc: Oral     SpO2:  98% 99% 98%  Weight: 53 kg     Height: 5\' 4"  (1.626 m)       Intake/Output Summary (Last 24 hours) at 02/18/2024 1539 Last data filed at 02/18/2024 1247 Gross per 24 hour  Intake 460 ml  Output --  Net 460 ml   Filed Weights   02/17/24 2309  Weight: 53 kg     Exam General exam: Appears calm and comfortable  Respiratory system: Clear to auscultation. Respiratory effort  normal. Cardiovascular system: S1 & S2 heard, RRR. No JVD, Gastrointestinal system: Abdomen is nondistended, soft and nontender. Central nervous system: Alert and oriented to person and place.  Extremities: Symmetric 5 x 5 power. Skin: No rashes, Psychiatry:Mood & affect appropriate.     Data Reviewed:  I have personally reviewed following labs and imaging studies   CBC Lab Results  Component Value Date   WBC 5.8 02/18/2024   RBC 4.56 02/18/2024   HGB 14.3 02/18/2024   HCT 45.3 02/18/2024   MCV 99.3 02/18/2024   MCH 31.4 02/18/2024   PLT 136 (L) 02/18/2024   MCHC 31.6 02/18/2024   RDW 12.6 02/18/2024   LYMPHSABS 1.8 08/28/2021   MONOABS 0.4 08/28/2021   EOSABS 0.1 08/28/2021   BASOSABS 0.0 08/28/2021     Last metabolic panel Lab Results  Component Value Date   NA 141 02/18/2024   K 3.5 02/18/2024   CL 109 02/18/2024   CO2 25 02/18/2024   BUN 22 02/18/2024   CREATININE 0.77 02/18/2024   GLUCOSE 109 (H) 02/18/2024   GFRNONAA >60 02/18/2024   GFRAA 65 (L) 11/02/2014   CALCIUM 8.7 (L) 02/18/2024   PROT 7.1 02/17/2024   ALBUMIN 3.7 02/17/2024   BILITOT 1.8 (H) 02/17/2024   ALKPHOS 40 02/17/2024   AST 22 02/17/2024   ALT 11 02/17/2024   ANIONGAP 7 02/18/2024    CBG (last 3)  Recent Labs    02/17/24 1521  GLUCAP 74      Coagulation Profile: Recent Labs  Lab 02/18/24 0418  INR 1.5*     Radiology Studies: DG Chest 2 View Result Date: 02/17/2024 CLINICAL DATA:  Weakness EXAM: CHEST - 2 VIEW COMPARISON:  None Available. FINDINGS: No consolidation, pneumothorax or effusion. No edema. Enlarged cardiopericardial silhouette calcified aorta. Calcified aorta. Overlapping cardiac leads. IMPRESSION: No acute cardiopulmonary disease. Electronically Signed   By: Karen Kays M.D.   On: 02/17/2024 15:30       Kathlen Mody M.D. [facility] 02/18/2024, 3:39 PM  Available via Epic secure chat 7am-7pm After 7 pm, please refer to night coverage provider  listed on amion.

## 2024-02-18 NOTE — Progress Notes (Signed)
 [facility] Liaison Note: This patient is currently enrolled in [facility] outpatient-based palliative care.  Please call for any outpatient based palliative care related questions or concerns. Thank you, Henderson Newcomer, LPN [facility] Liaison [phone]

## 2024-02-18 NOTE — Progress Notes (Signed)
 Initial Nutrition Assessment  DOCUMENTATION CODES:   Non-severe (moderate) malnutrition in context of chronic illness  INTERVENTION:  - Regular diet.  - Ensure Plus High Protein po BID, each supplement provides 350 kcal and 20 grams of protein. - Magic cup BID with meals, each supplement provides 290 kcal and 9 grams of protein - Multivitamin with minerals daily - Monitor weight trends.   NUTRITION DIAGNOSIS:   Moderate Malnutrition related to chronic illness as evidenced by mild fat depletion, moderate muscle depletion  GOAL:   Patient will meet greater than or equal to 90% of their needs  MONITOR:   PO intake, Supplement acceptance, Weight trends  REASON FOR ASSESSMENT:   Consult Assessment of nutrition requirement/status  ASSESSMENT:   89 y.o. female with PMH significant of chronic cognitive deficit who is admitted for UTI and acute metabolic encephalopathy.  Patient in bed at time of visit, noted to be a little confused with history of chronic cognitive deficit.  Daughter at bedside provided all nutrition history.   Daughter unsure of a UBW but reports the patient has been losing weight for some time. Unsure of how long she has been losing weight.  Per EMR, patient weighed at 125# in November and now weighed at 116#. This is a 9# or 7.3% weight loss in 4.75 months, which is not significant for the time frame.   Daughter reports the patient eats 3 small portioned meals a day at home. Has consumed Ensure in the past but daughter unsure of patient consuming regularly now.   Patient had a decent breakfast this AM. Documented to have had 95% of breakfast and 80% of lunch today.  She is agreeable to receive strawberry Ensure and try berry Magic Cup.    Medications reviewed and include: -  Labs reviewed:  -   NUTRITION - FOCUSED PHYSICAL EXAM:  Flowsheet Row Most Recent Value  Orbital Region Mild depletion  Upper Arm Region Mild depletion  Thoracic and Lumbar  Region Unable to assess  Buccal Region Moderate depletion  Temple Region Moderate depletion  Clavicle Bone Region Mild depletion  Clavicle and Acromion Bone Region Moderate depletion  Scapular Bone Region Unable to assess  Dorsal Hand Mild depletion  Patellar Region No depletion  Anterior Thigh Region No depletion  Posterior Calf Region No depletion  Edema (RD Assessment) None  Hair Reviewed  Eyes Reviewed  Mouth Reviewed  Skin Reviewed  Nails Reviewed       Diet Order:   Diet Order             Diet regular Room service appropriate? Yes; Fluid consistency: Thin  Diet effective now                   EDUCATION NEEDS:  Education needs have been addressed  Skin:  Skin Assessment: Reviewed RN Assessment  Last BM:  3/19  Height:  Ht Readings from Last 1 Encounters:  02/17/24 5\' 4"  (1.626 m)   Weight:  Wt Readings from Last 1 Encounters:  02/17/24 53 kg   BMI:  Body mass index is 20.06 kg/m.  Estimated Nutritional Needs:  Kcal:  1450-1600 kcals Protein:  65-80 grams Fluid:  >/= 1.5L    Shelle Iron RD, LDN Contact via Secure Chat.

## 2024-02-18 NOTE — Plan of Care (Signed)

## 2024-02-18 NOTE — Evaluation (Signed)
 Physical Therapy Evaluation Patient Details Name: Anne [name] MRN: [id number] DOB: 23-Jun-1929 Today's Date: 02/18/2024  History of Present Illness  Anne [name] is a 89 y.o. female admitted with hypernatremia. PMH: chronic cognitive deficit, hypothyroidism, GERD, stroke  Clinical Impression  Pt admitted with above diagnosis. At baseline, pt amb around home without AD though does have RW and SPC, lives with spouse, pt is ind with self care and spouse completes household chores and grocery shopping and driving. On eval, pt freely mobilizing BLE in bed. Pt needing min A to come to sitting EOB, pulling on bedrail and therapist's hand to assist in uprighting trunk into sitting and mobilizing BLE towards EOB. Pt declines transfer or amb, reports recently went to restroom with nursing and currently fatigued and wanting to rest. Assisted pt back to supine with min A and repositioned to comfort. Encouraged pt to get OOB with meals and amb to restroom with nursing as needed. Daughter assists in providing PLOF, home set up and DME information; daughter reports goal is for pt to return home. Recommend HHPT at d/c with spouse assisting as needed. Pt currently with functional limitations due to the deficits listed below (see PT Problem List). Pt will benefit from acute skilled PT to increase their independence and safety with mobility to allow discharge.           If plan is discharge home, recommend the following: A little help with walking and/or transfers;A little help with bathing/dressing/bathroom;Assistance with cooking/housework;Assist for transportation   Can travel by private vehicle        Equipment Recommendations None recommended by PT  Recommendations for Other Services       Functional Status Assessment Patient has had a recent decline in their functional status and demonstrates the ability to make significant improvements in function in a reasonable and predictable amount of  time.     Precautions / Restrictions Precautions Precautions: Fall Restrictions Weight Bearing Restrictions Per Provider Order: No      Mobility  Bed Mobility Overal bed mobility: Needs Assistance Bed Mobility: Supine to Sit, Sit to Supine     Supine to sit: Min assist, HOB elevated, Used rails Sit to supine: Min assist   General bed mobility comments: min A to upright trunk into sitting and bring BLE to EOB, pt able to pull on bedrail and therapist's hand to assist in uprighting trunk; min A to lift BLE Back into bed and reposition to comfort    Transfers                   General transfer comment: pt declines, fatigued from recent trip to restroom    Ambulation/Gait                  Stairs            Wheelchair Mobility     Tilt Bed    Modified Rankin (Stroke Patients Only)       Balance Overall balance assessment: Mild deficits observed, not formally tested                                           Pertinent Vitals/Pain Pain Assessment Pain Assessment: No/denies pain    Home Living Family/patient expects to be discharged to:: Private residence Living Arrangements: Spouse/significant other Available Help at Discharge: Family Type of Home: House Home Access: Level  entry       Home Layout: Two level;Bed/bath upstairs;Able to live on main level with bedroom/bathroom Home Equipment: Rolling Walker (2 wheels);Cane - single point Additional Comments: Daughter reports pt and spouse sleep on 2nd floor due to habit, but primary bed/bath are available.    Prior Function Prior Level of Function : Independent/Modified Independent;Needs assist             Mobility Comments: daughter reports pt ind with ambulation in the home, does not use SPC or RW but they are present ADLs Comments: daughter reports pt ind with self care, spouse completes household chores and grocery shopping and driving     Extremity/Trunk  Assessment   Upper Extremity Assessment Upper Extremity Assessment: Overall WFL for tasks assessed    Lower Extremity Assessment Lower Extremity Assessment: Generalized weakness    Cervical / Trunk Assessment Cervical / Trunk Assessment: Kyphotic  Communication   Communication Communication: No apparent difficulties    Cognition Arousal: Alert Behavior During Therapy: WFL for tasks assessed/performed   PT - Cognitive impairments: History of cognitive impairments                       PT - Cognition Comments: pt pleasant, verbose in recounting being in ED yesterday         Cueing Cueing Techniques: Verbal cues, Gestural cues, Tactile cues     General Comments      Exercises     Assessment/Plan    PT Assessment Patient needs continued PT services  PT Problem List Decreased strength;Decreased activity tolerance;Decreased balance;Decreased cognition;Decreased knowledge of use of DME;Decreased safety awareness;Decreased mobility       PT Treatment Interventions DME instruction;Gait training;Stair training;Functional mobility training;Therapeutic activities;Therapeutic exercise;Balance training;Cognitive remediation;Patient/family education    PT Goals (Current goals can be found in the Care Plan section)  Acute Rehab PT Goals Patient Stated Goal: return home PT Goal Formulation: With patient/family Time For Goal Achievement: 03/03/24 Potential to Achieve Goals: Good    Frequency Min 2X/week     Co-evaluation               AM-PAC PT "6 Clicks" Mobility  Outcome Measure Help needed turning from your back to your side while in a flat bed without using bedrails?: A Little Help needed moving from lying on your back to sitting on the side of a flat bed without using bedrails?: A Little Help needed moving to and from a bed to a chair (including a wheelchair)?: A Lot Help needed standing up from a chair using your arms (e.g., wheelchair or bedside  chair)?: A Lot Help needed to walk in hospital room?: A Lot Help needed climbing 3-5 steps with a railing? : Total 6 Click Score: 13    End of Session   Activity Tolerance: Patient tolerated treatment well;Patient limited by fatigue Patient left: in bed;with call bell/phone within reach;with bed alarm set;with family/visitor present Nurse Communication: Mobility status PT Visit Diagnosis: Other abnormalities of gait and mobility (R26.89);Muscle weakness (generalized) (M62.81)    Time: 1610-9604 PT Time Calculation (min) (ACUTE ONLY): 23 min   Charges:   PT Evaluation $PT Eval Low Complexity: 1 Low PT Treatments $Therapeutic Activity: 8-22 mins PT General Charges $$ ACUTE PT VISIT: 1 Visit         Tori Oni Dietzman PT, DPT 02/18/24, 1:47 PM

## 2024-02-19 DIAGNOSIS — G9341 Metabolic encephalopathy: Secondary | ICD-10-CM | POA: Diagnosis not present

## 2024-02-19 DIAGNOSIS — N390 Urinary tract infection, site not specified: Secondary | ICD-10-CM | POA: Diagnosis not present

## 2024-02-19 DIAGNOSIS — I639 Cerebral infarction, unspecified: Secondary | ICD-10-CM | POA: Diagnosis not present

## 2024-02-19 DIAGNOSIS — E870 Hyperosmolality and hypernatremia: Secondary | ICD-10-CM | POA: Diagnosis not present

## 2024-02-19 MED ORDER — CEPHALEXIN 250 MG PO CAPS: 500.0000 mg | ORAL_CAPSULE | Freq: Two times a day (BID) | ORAL | 0 refills | Status: AC

## 2024-02-19 MED ORDER — ADULT MULTIVITAMIN W/MINERALS CH: 1.0000 | ORAL_TABLET | Freq: Every day | ORAL | 2 refills | Status: AC

## 2024-02-19 MED ORDER — ENSURE ENLIVE PO LIQD: 237.0000 mL | Freq: Two times a day (BID) | ORAL | 2 refills | Status: AC

## 2024-02-19 MED ORDER — AMLODIPINE BESYLATE 5 MG PO TABS: 2.5000 mg | ORAL_TABLET | Freq: Every day | ORAL | 0 refills | Status: AC

## 2024-02-19 MED ORDER — AMLODIPINE BESYLATE 5 MG PO TABS
5.0000 mg | ORAL_TABLET | Freq: Every day | ORAL | Status: DC
  Administered 2024-02-19: 5 mg via ORAL
  Filled 2024-02-19: qty 1

## 2024-02-19 NOTE — TOC Transition Note (Signed)
 Transition of Care [facility]) - Discharge Note   Patient Details  Name: Anne [name] MRN: [id number] Date of Birth: August 06, 1929  [facility]) CM/SW Contact:  Larrie Kass, LCSW Phone Number: 02/19/2024, 12:27 PM   Clinical Narrative:     CSW spoke with pt's spouse Lampros Laurent to discuss recommendations for home health services. Pt's spouse has not preference in agency. CSW discuss barriers in securing [facility] agency with pt's [employer].   [facility] has accepted pt for HHPT/OT/aide. Pt's family to transport pt home no further TOC needs TOC sign off.   Final next level of care: Home w Home Health Services Barriers to Discharge: Barriers Resolved   Patient Goals and CMS Choice Patient states their goals for this hospitalization and ongoing recovery are:: retrun home with home health CMS Medicare.gov Compare Post Acute Care list provided to:: Patient Represenative (must comment) Choice offered to / list presented to : Spouse      Discharge Placement                    Patient and family notified of of transfer: 02/19/24  Discharge Plan and Services Additional resources added to the After Visit Summary for                                       Social Drivers of Health (SDOH) Interventions SDOH Screenings   Food Insecurity: No Food Insecurity (02/17/2024)  Housing: Low Risk  (02/17/2024)  Transportation Needs: No Transportation Needs (02/17/2024)  Utilities: Not At Risk (02/17/2024)  Social Connections: Socially Integrated (02/17/2024)  Tobacco Use: Low Risk  (10/06/2023)     Readmission Risk Interventions     No data to display

## 2024-02-19 NOTE — Progress Notes (Addendum)
 AVS reviewed w/ pt & daughter Lance Coon) who verbalized an understanding. Noo there questions-PIV removed as noted. DNR Gold sheet returned to pt - placed in d/c envelope which was given to  the daughter Pt dressed for d/c to home. PT to lobby via w/c - home w/ daughter

## 2024-02-19 NOTE — Plan of Care (Signed)

## 2024-02-19 NOTE — Progress Notes (Signed)
 Mobility Specialist - Progress Note   02/19/24 1135  Mobility  Activity Transferred from bed to chair  Level of Assistance +2 (takes two people)  Activity Response Tolerated well  Mobility Referral Yes  Mobility visit 1 Mobility  Mobility Specialist Start Time (ACUTE ONLY) 1126  Mobility Specialist Stop Time (ACUTE ONLY) 1135  Mobility Specialist Time Calculation (min) (ACUTE ONLY) 9 min   Pt received in bed and agreeable to transfer to the recliner. Daughter in room to assist with transfer d/t walker not in room during session. Pt modA from STS & CG during transfer. No complaints during session. Pt to recliner after session with all needs met.    [name]

## 2024-02-20 NOTE — Discharge Summary (Signed)
 Physician Discharge Summary   Patient: Anne [name] MRN: [id number] DOB: 10-13-1929  Admit date:     02/17/2024  Discharge date: 02/19/2024  Discharge Physician: Kathlen Mody   PCP: Irena Reichmann, DO   Recommendations at discharge:  Please follow up with PCp in one week.  Please follow up with cbc and bmp in one week.   Discharge Diagnoses: Principal Problem:   Hypernatremia Active Problems:   UTI (urinary tract infection)   Cryptogenic stroke (HCC)   Acute metabolic encephalopathy   Malnutrition of moderate degree  Resolved Problems:   * No resolved hospital problems. *  Hospital Course:   Anne [name] is a 89 y.o. female with medical history significant of chronic cognitive deficit, depression, hypothyroidism, GERD, anxiety, presents to ED with generalized weakness and confusion.   Assessment and Plan:   * Hypernatremia Possibly from dehydration and free water deficit.  Resolved with IV fluids.  No diarrhea in the last 2 days.    UTI (urinary tract infection) Continue with IV ceftriaxone.  Unfortunately urine cultures were not sent on admission.  Completed 3 days of IV ceftriaxone.  Discharged on 3 days of oral keflex.    Acute metabolic encephalopathy In the setting of dementia.  Probably from UTI and hypernatremia.      Cryptogenic stroke (HCC) Remote Continue with eliquis.    Paroxysmal atrial fibrillation (HCC) Rate controlled.      Ischemic stroke (HCC) Continue with zocor.        Hypothyroidism:  Resume synthroid 75 mcg daily.    Moderate Malnutrition Supplementation added.         Consultants: none.  Procedures performed: none.  Disposition: Home Diet recommendation:  Discharge Diet Orders (From admission, onward)     Start     Ordered   02/19/24 0000  Diet - low sodium heart healthy        02/19/24 1203           Regular diet DISCHARGE MEDICATION: Allergies as of 02/19/2024       Reactions   Sulfa Antibiotics  Rash, Other (See Comments)        Medication List     TAKE these medications    ALPRAZolam 0.25 MG tablet Commonly known as: XANAX Take by mouth daily. 1 - 2 tabs prn   amLODipine 5 MG tablet Commonly known as: NORVASC Take 0.5 tablets (2.5 mg total) by mouth daily.   cephALEXin 250 MG capsule Commonly known as: KEFLEX Take 2 capsules (500 mg total) by mouth 2 (two) times daily for 3 days.   Eliquis 2.5 MG Tabs tablet Generic drug: apixaban TAKE 1 TABLET(2.5 MG) BY MOUTH TWICE DAILY   feeding supplement Liqd Take 237 mLs by mouth 2 (two) times daily between meals.   levothyroxine 75 MCG tablet Commonly known as: SYNTHROID Take 75 mcg by mouth daily before breakfast.   multivitamin with minerals Tabs tablet Take 1 tablet by mouth daily.   polyethylene glycol 17 g packet Commonly known as: MIRALAX / GLYCOLAX Take 17 g by mouth daily as needed for mild constipation (constipation).   simvastatin 20 MG tablet Commonly known as: ZOCOR Take 20 mg by mouth daily.   Vitamin D3 25 MCG (1000 UT) Caps Take 1,000 Units by mouth daily.        Follow-up Information     Irena Reichmann, DO. Schedule an appointment as soon as possible for a visit in 1 week(s).   Specialty: Family Medicine Contact information: [phone]  [address] [place] [address] [place] [address] [phone]         [facility] [place], [place] Follow up.   Why: [facility] will follow up with you after discharge. Contact information: [address] [place] [address] [phone]                Discharge Exam: Filed Weights   02/17/24 2309  Weight: 53 kg   General exam: Appears calm and comfortable  Respiratory system: Clear to auscultation. Respiratory effort normal. Cardiovascular system: S1 & S2 heard, RRR. No JVD, Gastrointestinal system: Abdomen is nondistended, soft and nontender. Central nervous system: Alert and oriented. No focal  neurological deficits. Extremities: Symmetric 5 x 5 power. Skin: No rashes, lesions or ulcers Psychiatry: Mood & affect appropriate.    Condition at discharge: fair  The results of significant diagnostics from this hospitalization (including imaging, microbiology, ancillary and laboratory) are listed below for reference.   Imaging Studies: DG Chest 2 View Result Date: 02/17/2024 CLINICAL DATA:  Weakness EXAM: CHEST - 2 VIEW COMPARISON:  None Available. FINDINGS: No consolidation, pneumothorax or effusion. No edema. Enlarged cardiopericardial silhouette calcified aorta. Calcified aorta. Overlapping cardiac leads. IMPRESSION: No acute cardiopulmonary disease. Electronically Signed   By: Karen Kays M.D.   On: 02/17/2024 15:30    Microbiology: Results for orders placed or performed during the hospital encounter of 08/28/21  MRSA Next Gen by PCR, Nasal     Status: None   Collection Time: 08/28/21  8:34 PM   Specimen: Nasal Mucosa; Nasal Swab  Result Value Ref Range Status   MRSA by PCR Next Gen NOT DETECTED NOT DETECTED Final    Comment: (NOTE) The GeneXpert MRSA Assay (FDA approved for NASAL specimens only), is one component of a comprehensive MRSA colonization surveillance program. It is not intended to diagnose MRSA infection nor to guide or monitor treatment for MRSA infections. Test performance is not FDA approved in patients less than 59 years old. Performed at [facility], [address] [place], [place] [address]   Resp Panel by RT-PCR (Flu A&B, Covid) Nasopharyngeal Swab     Status: None   Collection Time: 08/29/21  7:59 AM   Specimen: Nasopharyngeal Swab; Nasopharyngeal(NP) swabs in vial transport medium  Result Value Ref Range Status   SARS Coronavirus 2 by RT PCR NEGATIVE NEGATIVE Final    Comment: (NOTE) SARS-CoV-2 target nucleic acids are NOT DETECTED.  The SARS-CoV-2 RNA is generally detectable in upper respiratory specimens during the acute phase of  infection. The lowest concentration of SARS-CoV-2 viral copies this assay can detect is 138 copies/mL. A negative result does not preclude SARS-Cov-2 infection and should not be used as the sole basis for treatment or other patient management decisions. A negative result may occur with  improper specimen collection/handling, submission of specimen other than nasopharyngeal swab, presence of viral mutation(s) within the areas targeted by this assay, and inadequate number of viral copies(<138 copies/mL). A negative result must be combined with clinical observations, patient history, and epidemiological information. The expected result is Negative.  Fact Sheet for Patients:  BloggerCourse.com  Fact Sheet for Healthcare Providers:  SeriousBroker.it  This test is no t yet approved or cleared by the [place] FDA and  has been authorized for detection and/or diagnosis of SARS-CoV-2 by FDA under an Emergency Use Authorization (EUA). This EUA will remain  in effect (meaning this test can be used) for the duration of the  COVID-19 declaration under Section 564(b)(1) of the Act, 21 U.S.C.section 360bbb-3(b)(1), unless the authorization is terminated  or revoked sooner.       Influenza A by PCR NEGATIVE NEGATIVE Final   Influenza B by PCR NEGATIVE NEGATIVE Final    Comment: (NOTE) The Xpert Xpress SARS-CoV-2/FLU/RSV plus assay is intended as an aid in the diagnosis of influenza from Nasopharyngeal swab specimens and should not be used as a sole basis for treatment. Nasal washings and aspirates are unacceptable for Xpert Xpress SARS-CoV-2/FLU/RSV testing.  Fact Sheet for Patients: BloggerCourse.com  Fact Sheet for Healthcare Providers: SeriousBroker.it  This test is not yet approved or cleared by the [place] FDA and has been authorized for detection and/or diagnosis of SARS-CoV-2  by FDA under an Emergency Use Authorization (EUA). This EUA will remain in effect (meaning this test can be used) for the duration of the COVID-19 declaration under Section 564(b)(1) of the Act, 21 U.S.C. section 360bbb-3(b)(1), unless the authorization is terminated or revoked.  Performed at [facility], [address], [place], [place] [address]   Resp Panel by RT-PCR (Flu A&B, Covid) Nasopharyngeal Swab     Status: None   Collection Time: 09/03/21 11:24 AM   Specimen: Nasopharyngeal Swab; Nasopharyngeal(NP) swabs in vial transport medium  Result Value Ref Range Status   SARS Coronavirus 2 by RT PCR NEGATIVE NEGATIVE Final    Comment: (NOTE) SARS-CoV-2 target nucleic acids are NOT DETECTED.  The SARS-CoV-2 RNA is generally detectable in upper respiratory specimens during the acute phase of infection. The lowest concentration of SARS-CoV-2 viral copies this assay can detect is 138 copies/mL. A negative result does not preclude SARS-Cov-2 infection and should not be used as the sole basis for treatment or other patient management decisions. A negative result may occur with  improper specimen collection/handling, submission of specimen other than nasopharyngeal swab, presence of viral mutation(s) within the areas targeted by this assay, and inadequate number of viral copies(<138 copies/mL). A negative result must be combined with clinical observations, patient history, and epidemiological information. The expected result is Negative.  Fact Sheet for Patients:  BloggerCourse.com  Fact Sheet for Healthcare Providers:  SeriousBroker.it  This test is no t yet approved or cleared by the [place] FDA and  has been authorized for detection and/or diagnosis of SARS-CoV-2 by FDA under an Emergency Use Authorization (EUA). This EUA will remain  in effect (meaning this test can be used) for the duration of the COVID-19  declaration under Section 564(b)(1) of the Act, 21 U.S.C.section 360bbb-3(b)(1), unless the authorization is terminated  or revoked sooner.       Influenza A by PCR NEGATIVE NEGATIVE Final   Influenza B by PCR NEGATIVE NEGATIVE Final    Comment: (NOTE) The Xpert Xpress SARS-CoV-2/FLU/RSV plus assay is intended as an aid in the diagnosis of influenza from Nasopharyngeal swab specimens and should not be used as a sole basis for treatment. Nasal washings and aspirates are unacceptable for Xpert Xpress SARS-CoV-2/FLU/RSV testing.  Fact Sheet for Patients: BloggerCourse.com  Fact Sheet for Healthcare Providers: SeriousBroker.it  This test is not yet approved or cleared by the [place] FDA and has been authorized for detection and/or diagnosis of SARS-CoV-2 by FDA under an Emergency Use Authorization (EUA). This EUA will remain in effect (meaning this test can be used) for the duration of the COVID-19 declaration under Section 564(b)(1) of the Act, 21 U.S.C. section 360bbb-3(b)(1), unless the authorization is terminated or revoked.  Performed at [facility]  [facility], [address] [place], [place] [address]     Labs: CBC: Recent Labs  Lab 02/17/24 1511 02/17/24 1532 02/18/24 0418  WBC 5.1  --  5.8  HGB 14.9 11.9* 14.3  HCT 46.9* 35.0* 45.3  MCV 99.4  --  99.3  PLT 145*  --  136*   Basic Metabolic Panel: Recent Labs  Lab 02/17/24 1511 02/17/24 1532 02/18/24 0418  NA 143 146* 141  K 4.7 3.4* 3.5  CL 109 114* 109  CO2 26  --  25  GLUCOSE 108* 94 109*  BUN 26* 23 22  CREATININE 0.70 0.80 0.77  CALCIUM 9.3  --  8.7*   Liver Function Tests: Recent Labs  Lab 02/17/24 1511  AST 22  ALT 11  ALKPHOS 40  BILITOT 1.8*  PROT 7.1  ALBUMIN 3.7   CBG: Recent Labs  Lab 02/17/24 1521  GLUCAP 74    Discharge time spent: 36 minutes.   Signed: Kathlen Mody, MD [facility]

## 2024-02-21 ENCOUNTER — Telehealth: Payer: Self-pay

## 2024-02-21 DIAGNOSIS — G9341 Metabolic encephalopathy: Secondary | ICD-10-CM | POA: Diagnosis not present

## 2024-02-21 DIAGNOSIS — I480 Paroxysmal atrial fibrillation: Secondary | ICD-10-CM | POA: Diagnosis not present

## 2024-02-21 DIAGNOSIS — N390 Urinary tract infection, site not specified: Secondary | ICD-10-CM | POA: Diagnosis not present

## 2024-02-21 DIAGNOSIS — N189 Chronic kidney disease, unspecified: Secondary | ICD-10-CM | POA: Diagnosis not present

## 2024-02-21 DIAGNOSIS — E870 Hyperosmolality and hypernatremia: Secondary | ICD-10-CM | POA: Diagnosis not present

## 2024-02-21 NOTE — Transitions of Care (Post Inpatient/ED Visit) (Signed)
 02/21/2024  Name: Anne [name] MRN: [id number] DOB: 06-Apr-1929  Today's TOC FU Call Status: Today's TOC FU Call Status:: Successful TOC FU Call Completed TOC FU Call Complete Date: 02/21/24 Patient's Name and Date of Birth confirmed.  Transition Care Management Follow-up Telephone Call Date of Discharge: 02/19/24 Discharge Facility: [facility]) Type of Discharge: Inpatient Admission Primary Inpatient Discharge Diagnosis:: Bladder Infection How have you been since you were released from the hospital?: Better Any questions or concerns?: No  Items Reviewed: Did you receive and understand the discharge instructions provided?: Yes Medications obtained,verified, and reconciled?: Yes (Medications Reviewed) Any new allergies since your discharge?: No Dietary orders reviewed?: Yes Type of Diet Ordered:: Low sodium Heart Healthy Do you have support at home?: Yes People in Home: spouse Name of Support/Comfort Primary Source: [place], spouse  Medications Reviewed Today: Medications Reviewed Today     Reviewed by Redge Gainer, RN (Case Manager) on 02/21/24 at 1349  Med List Status: <None>   Medication Order Taking? Sig Documenting Provider Last Dose Status Informant  ALPRAZolam (XANAX) 0.25 MG tablet [id number] No Take by mouth daily. 1 - 2 tabs prn [provider] Taking Active Child, Pharmacy Records           Med Note (SATTERFIELD, Genoveva Ill   Fri Aug 29, 2021  4:34 PM)    amLODipine (NORVASC) 5 MG tablet [id number]  Take 0.5 tablets (2.5 mg total) by mouth daily. Kathlen Mody, MD  Active   cephALEXin (KEFLEX) 250 MG capsule [id number]  Take 2 capsules (500 mg total) by mouth 2 (two) times daily for 3 days. Kathlen Mody, MD  Active   Cholecalciferol (VITAMIN D3) 1000 UNITS CAPS [id number] No Take 1,000 Units by mouth daily.  [provider] 02/16/2024 Active Child, Pharmacy Records  ELIQUIS 2.5 MG TABS tablet [id number] No TAKE 1 TABLET(2.5 MG) BY MOUTH TWICE  DAILY Duke Salvia, MD 02/16/2024 Evening Active Child, Pharmacy Records           Med Note [facility], SUSAN A   Thu Feb 17, 2024  2:29 PM) Pt's daughter at bedside unsure of time she took medication  feeding supplement (ENSURE ENLIVE / ENSURE PLUS) LIQD [id number]  Take 237 mLs by mouth 2 (two) times daily between meals. Kathlen Mody, MD  Active   levothyroxine (SYNTHROID) 75 MCG tablet [id number] No Take 75 mcg by mouth daily before breakfast. [provider] 02/16/2024 Active Child, Pharmacy Records  Multiple Vitamin (MULTIVITAMIN WITH MINERALS) TABS tablet [id number]  Take 1 tablet by mouth daily. Kathlen Mody, MD  Active   polyethylene glycol [facility] / GLYCOLAX) packet [id number] No Take 17 g by mouth daily as needed for mild constipation (constipation). [provider] Taking Active Child, Pharmacy Records  simvastatin (ZOCOR) 20 MG tablet [id number] No Take 20 mg by mouth daily. [provider] 02/16/2024 Active Child, Pharmacy Records            Home Care and Equipment/Supplies: Were Home Health Services Ordered?: Yes Name of Home Health Agency:: [facility] Has Agency set up a time to come to your home?: Yes First Home Health Visit Date: 02/21/24 Any new equipment or medical supplies ordered?: No  Functional Questionnaire: Do you need assistance with bathing/showering or dressing?: No Do you need assistance with meal preparation?: Yes (Spouse provides) Do you need assistance with eating?: No Do you have difficulty maintaining continence: No Do you need assistance with getting out of bed/getting out of a chair/moving?: Yes (Supervision to CG  assistance) Do you have difficulty managing or taking your medications?: Yes (Spouse provides medication management)  Follow up appointments reviewed: PCP Follow-up appointment confirmed?: No MD Provider Line Number:[phone] Given: No (The spouse states he will call tomorrow) [facility]  Follow-up appointment confirmed?: NA Do you need transportation to your follow-up appointment?: No Do you understand care options if your condition(s) worsen?: Yes-patient verbalized understanding  SDOH Interventions Today    Flowsheet Row Most Recent Value  SDOH Interventions   Food Insecurity Interventions Intervention Not Indicated  Housing Interventions Intervention Not Indicated  Transportation Interventions Intervention Not Indicated  Utilities Interventions Intervention Not Indicated      Interventions Today    Flowsheet Row Most Recent Value  General Interventions   General Interventions Discussed/Reviewed General Interventions Discussed, General Interventions Reviewed, Doctor Visits  Exercise Interventions   Exercise Discussed/Reviewed Physical Activity  Physical Activity Discussed/Reviewed Physical Activity Reviewed  Education Interventions   Education Provided Provided Education  Provided Verbal Education On Medication, When to see the doctor  Pharmacy Interventions   Pharmacy Dicussed/Reviewed Medications and their functions        The patient has been provided with contact information for the care management team and has been advised to call with any health-related questions or concerns. The patient verbalized understanding with current POC. The patient is directed to their insurance card regarding availability of benefits coverage.   Deidre Ala, BSN, RN [place]  [facility]BCI - [employer] [facility] RN Care Manager [phone]

## 2024-02-24 DIAGNOSIS — Z09 Encounter for follow-up examination after completed treatment for conditions other than malignant neoplasm: Secondary | ICD-10-CM | POA: Diagnosis not present

## 2024-02-24 DIAGNOSIS — R531 Weakness: Secondary | ICD-10-CM | POA: Diagnosis not present

## 2024-02-24 DIAGNOSIS — I69393 Ataxia following cerebral infarction: Secondary | ICD-10-CM | POA: Diagnosis not present

## 2024-02-24 DIAGNOSIS — I6930 Unspecified sequelae of cerebral infarction: Secondary | ICD-10-CM | POA: Diagnosis not present

## 2024-03-02 DIAGNOSIS — N189 Chronic kidney disease, unspecified: Secondary | ICD-10-CM | POA: Diagnosis not present

## 2024-03-02 DIAGNOSIS — I480 Paroxysmal atrial fibrillation: Secondary | ICD-10-CM | POA: Diagnosis not present

## 2024-03-02 DIAGNOSIS — G9341 Metabolic encephalopathy: Secondary | ICD-10-CM | POA: Diagnosis not present

## 2024-03-02 DIAGNOSIS — N390 Urinary tract infection, site not specified: Secondary | ICD-10-CM | POA: Diagnosis not present
